# Patient Record
Sex: Female | Born: 1935 | Race: White | Hispanic: No | Smoking: Never smoker
Health system: Southern US, Community
[De-identification: ages and names within clinical notes are randomized; demographics above are authoritative.]

## PROBLEM LIST (undated history)

## (undated) DIAGNOSIS — I10 Essential (primary) hypertension: Secondary | ICD-10-CM

## (undated) DIAGNOSIS — M810 Age-related osteoporosis without current pathological fracture: Secondary | ICD-10-CM

## (undated) DIAGNOSIS — E559 Vitamin D deficiency, unspecified: Secondary | ICD-10-CM

## (undated) DIAGNOSIS — E785 Hyperlipidemia, unspecified: Secondary | ICD-10-CM

## (undated) DIAGNOSIS — Z853 Personal history of malignant neoplasm of breast: Secondary | ICD-10-CM

## (undated) DIAGNOSIS — Z8542 Personal history of malignant neoplasm of other parts of uterus: Secondary | ICD-10-CM

## (undated) DIAGNOSIS — R7303 Prediabetes: Secondary | ICD-10-CM

## (undated) HISTORY — DX: Personal history of malignant neoplasm of breast: Z85.3

## (undated) HISTORY — PX: TOTAL ABDOMINAL HYSTERECTOMY W/ BILATERAL SALPINGOOPHORECTOMY: SHX83

## (undated) HISTORY — PX: TONSILLECTOMY: SUR1361

## (undated) HISTORY — DX: Vitamin D deficiency, unspecified: E55.9

## (undated) HISTORY — DX: Hyperlipidemia, unspecified: E78.5

## (undated) HISTORY — DX: Personal history of malignant neoplasm of other parts of uterus: Z85.42

## (undated) HISTORY — DX: Prediabetes: R73.03

## (undated) HISTORY — DX: Age-related osteoporosis without current pathological fracture: M81.0

## (undated) HISTORY — DX: Essential (primary) hypertension: I10

---

## 1977-12-05 HISTORY — PX: COSMETIC SURGERY: SHX468

## 1980-12-05 DIAGNOSIS — Z8542 Personal history of malignant neoplasm of other parts of uterus: Secondary | ICD-10-CM

## 1980-12-05 HISTORY — DX: Personal history of malignant neoplasm of other parts of uterus: Z85.42

## 1998-06-11 ENCOUNTER — Other Ambulatory Visit: Admission: RE | Admit: 1998-06-11 | Discharge: 1998-06-11 | Payer: Self-pay | Admitting: Family Medicine

## 2001-07-10 ENCOUNTER — Other Ambulatory Visit: Admission: RE | Admit: 2001-07-10 | Discharge: 2001-07-10 | Payer: Self-pay | Admitting: Family Medicine

## 2004-09-27 ENCOUNTER — Ambulatory Visit: Payer: Self-pay | Admitting: Family Medicine

## 2004-11-23 ENCOUNTER — Ambulatory Visit: Payer: Self-pay | Admitting: Family Medicine

## 2004-12-03 ENCOUNTER — Ambulatory Visit: Payer: Self-pay | Admitting: *Deleted

## 2005-04-26 ENCOUNTER — Ambulatory Visit: Payer: Self-pay | Admitting: Family Medicine

## 2005-12-05 HISTORY — PX: BREAST SURGERY: SHX581

## 2006-01-15 ENCOUNTER — Encounter: Admission: RE | Admit: 2006-01-15 | Discharge: 2006-01-15 | Payer: Self-pay | Admitting: Obstetrics and Gynecology

## 2006-01-25 ENCOUNTER — Ambulatory Visit: Payer: Self-pay | Admitting: Oncology

## 2006-02-03 ENCOUNTER — Ambulatory Visit (HOSPITAL_COMMUNITY): Admission: RE | Admit: 2006-02-03 | Discharge: 2006-02-03 | Payer: Self-pay | Admitting: Hematology and Oncology

## 2006-03-07 LAB — CBC WITH DIFFERENTIAL/PLATELET
Basophils Absolute: 0 10*3/uL (ref 0.0–0.1)
Eosinophils Absolute: 0 10*3/uL (ref 0.0–0.5)
HCT: 33.8 % — ABNORMAL LOW (ref 34.8–46.6)
HGB: 11.7 g/dL (ref 11.6–15.9)
LYMPH%: 31.4 % (ref 14.0–48.0)
MCV: 88.7 fL (ref 81.0–101.0)
MONO#: 0.3 10*3/uL (ref 0.1–0.9)
MONO%: 21.4 % — ABNORMAL HIGH (ref 0.0–13.0)
NEUT#: 0.6 10*3/uL — ABNORMAL LOW (ref 1.5–6.5)
Platelets: 248 10*3/uL (ref 145–400)

## 2006-03-14 ENCOUNTER — Ambulatory Visit: Payer: Self-pay | Admitting: Oncology

## 2006-03-14 LAB — COMPREHENSIVE METABOLIC PANEL WITH GFR
ALT: 17 U/L (ref 0–40)
AST: 25 U/L (ref 0–37)
Albumin: 3.2 g/dL — ABNORMAL LOW (ref 3.5–5.2)
Alkaline Phosphatase: 128 U/L — ABNORMAL HIGH (ref 39–117)
BUN: 10 mg/dL (ref 6–23)
CO2: 33 meq/L — ABNORMAL HIGH (ref 19–32)
Calcium: 9.5 mg/dL (ref 8.4–10.5)
Chloride: 99 meq/L (ref 96–112)
Creatinine, Ser: 1.1 mg/dL (ref 0.4–1.2)
Glucose, Bld: 118 mg/dL — ABNORMAL HIGH (ref 70–99)
Potassium: 2.9 meq/L — ABNORMAL LOW (ref 3.5–5.3)
Sodium: 138 meq/L (ref 135–145)
Total Bilirubin: 0.5 mg/dL (ref 0.3–1.2)
Total Protein: 6.2 g/dL (ref 6.0–8.3)

## 2006-03-14 LAB — CBC WITH DIFFERENTIAL/PLATELET
Basophils Absolute: 0 10*3/uL (ref 0.0–0.1)
Eosinophils Absolute: 0 10*3/uL (ref 0.0–0.5)
HGB: 9.9 g/dL — ABNORMAL LOW (ref 11.6–15.9)
MCV: 90.2 fL (ref 81.0–101.0)
MONO#: 1.5 10*3/uL — ABNORMAL HIGH (ref 0.1–0.9)
MONO%: 3.6 % (ref 0.0–13.0)
NEUT#: 37.5 10*3/uL — ABNORMAL HIGH (ref 1.5–6.5)
Platelets: 193 10*3/uL (ref 145–400)
RBC: 3.31 10*6/uL — ABNORMAL LOW (ref 3.70–5.32)
RDW: 14.9 % — ABNORMAL HIGH (ref 11.3–14.5)
WBC: 40.8 10*3/uL — ABNORMAL HIGH (ref 3.9–10.0)

## 2006-03-28 LAB — CBC WITH DIFFERENTIAL/PLATELET
BASO%: 0.9 % (ref 0.0–2.0)
EOS%: 1.5 % (ref 0.0–7.0)
LYMPH%: 12.4 % — ABNORMAL LOW (ref 14.0–48.0)
MCH: 30.3 pg (ref 26.0–34.0)
MCHC: 33 g/dL (ref 32.0–36.0)
MCV: 91.8 fL (ref 81.0–101.0)
MONO%: 8.8 % (ref 0.0–13.0)
NEUT#: 6 10*3/uL (ref 1.5–6.5)
RBC: 3.82 10*6/uL (ref 3.70–5.32)
RDW: 17.4 % — ABNORMAL HIGH (ref 11.3–14.5)

## 2006-03-28 LAB — COMPREHENSIVE METABOLIC PANEL
ALT: 14 U/L (ref 0–40)
AST: 23 U/L (ref 0–37)
Albumin: 4.1 g/dL (ref 3.5–5.2)
Alkaline Phosphatase: 103 U/L (ref 39–117)
Glucose, Bld: 104 mg/dL — ABNORMAL HIGH (ref 70–99)
Potassium: 3.4 mEq/L — ABNORMAL LOW (ref 3.5–5.3)
Sodium: 135 mEq/L (ref 135–145)
Total Protein: 7.4 g/dL (ref 6.0–8.3)

## 2006-04-04 LAB — CBC WITH DIFFERENTIAL/PLATELET
BASO%: 0.6 % (ref 0.0–2.0)
HCT: 32.7 % — ABNORMAL LOW (ref 34.8–46.6)
HGB: 11.3 g/dL — ABNORMAL LOW (ref 11.6–15.9)
MCHC: 34.7 g/dL (ref 32.0–36.0)
MONO#: 0.1 10*3/uL (ref 0.1–0.9)
NEUT%: 86 % — ABNORMAL HIGH (ref 39.6–76.8)
WBC: 5.3 10*3/uL (ref 3.9–10.0)
lymph#: 0.5 10*3/uL — ABNORMAL LOW (ref 0.9–3.3)

## 2006-04-04 LAB — COMPREHENSIVE METABOLIC PANEL
ALT: 10 U/L (ref 0–40)
BUN: 27 mg/dL — ABNORMAL HIGH (ref 6–23)
CO2: 30 mEq/L (ref 19–32)
Calcium: 9.5 mg/dL (ref 8.4–10.5)
Chloride: 96 mEq/L (ref 96–112)
Creatinine, Ser: 1.2 mg/dL (ref 0.4–1.2)
Glucose, Bld: 97 mg/dL (ref 70–99)

## 2006-04-11 LAB — CBC WITH DIFFERENTIAL/PLATELET
Basophils Absolute: 0 10*3/uL (ref 0.0–0.1)
EOS%: 7.7 % — ABNORMAL HIGH (ref 0.0–7.0)
Eosinophils Absolute: 0.1 10*3/uL (ref 0.0–0.5)
HCT: 27.7 % — ABNORMAL LOW (ref 34.8–46.6)
HGB: 9.5 g/dL — ABNORMAL LOW (ref 11.6–15.9)
MCH: 31.1 pg (ref 26.0–34.0)
MONO#: 0 10*3/uL — ABNORMAL LOW (ref 0.1–0.9)
NEUT#: 1.3 10*3/uL — ABNORMAL LOW (ref 1.5–6.5)
NEUT%: 70.2 % (ref 39.6–76.8)
RDW: 17.3 % — ABNORMAL HIGH (ref 11.3–14.5)
WBC: 1.8 10*3/uL — ABNORMAL LOW (ref 3.9–10.0)
lymph#: 0.4 10*3/uL — ABNORMAL LOW (ref 0.9–3.3)

## 2006-04-18 LAB — CBC WITH DIFFERENTIAL/PLATELET
Basophils Absolute: 0.3 10*3/uL — ABNORMAL HIGH (ref 0.0–0.1)
EOS%: 0.8 % (ref 0.0–7.0)
Eosinophils Absolute: 0.2 10*3/uL (ref 0.0–0.5)
HCT: 31.3 % — ABNORMAL LOW (ref 34.8–46.6)
HGB: 10.9 g/dL — ABNORMAL LOW (ref 11.6–15.9)
MCH: 33.5 pg (ref 26.0–34.0)
MCV: 96.3 fL (ref 81.0–101.0)
MONO%: 8.2 % (ref 0.0–13.0)
NEUT#: 22.7 10*3/uL — ABNORMAL HIGH (ref 1.5–6.5)
NEUT%: 84.9 % — ABNORMAL HIGH (ref 39.6–76.8)
Platelets: 199 10*3/uL (ref 145–400)
RDW: 21.2 % — ABNORMAL HIGH (ref 11.3–14.5)

## 2006-04-25 LAB — CBC WITH DIFFERENTIAL/PLATELET
Basophils Absolute: 0.2 10*3/uL — ABNORMAL HIGH (ref 0.0–0.1)
EOS%: 0.3 % (ref 0.0–7.0)
Eosinophils Absolute: 0.1 10*3/uL (ref 0.0–0.5)
LYMPH%: 5 % — ABNORMAL LOW (ref 14.0–48.0)
MCH: 32.2 pg (ref 26.0–34.0)
MCV: 98.2 fL (ref 81.0–101.0)
MONO%: 3.8 % (ref 0.0–13.0)
NEUT#: 19.8 10*3/uL — ABNORMAL HIGH (ref 1.5–6.5)
Platelets: 160 10*3/uL (ref 145–400)
RBC: 3.4 10*6/uL — ABNORMAL LOW (ref 3.70–5.32)

## 2006-04-25 LAB — COMPREHENSIVE METABOLIC PANEL
AST: 23 U/L (ref 0–37)
Alkaline Phosphatase: 133 U/L — ABNORMAL HIGH (ref 39–117)
BUN: 14 mg/dL (ref 6–23)
Glucose, Bld: 81 mg/dL (ref 70–99)
Sodium: 135 mEq/L (ref 135–145)
Total Bilirubin: 0.4 mg/dL (ref 0.3–1.2)

## 2006-05-02 ENCOUNTER — Ambulatory Visit: Payer: Self-pay | Admitting: Oncology

## 2006-05-02 LAB — URINALYSIS, MICROSCOPIC - CHCC
Bilirubin (Urine): NEGATIVE
Nitrite: NEGATIVE
Protein: <30 mg/dL
pH: 6 (ref 4.6–8.0)

## 2006-05-02 LAB — CBC WITH DIFFERENTIAL/PLATELET
Basophils Absolute: 0 10*3/uL (ref 0.0–0.1)
Eosinophils Absolute: 0.1 10*3/uL (ref 0.0–0.5)
HGB: 11 g/dL — ABNORMAL LOW (ref 11.6–15.9)
LYMPH%: 9.3 % — ABNORMAL LOW (ref 14.0–48.0)
MCV: 98 fL (ref 81.0–101.0)
MONO%: 3 % (ref 0.0–13.0)
NEUT#: 4.4 10*3/uL (ref 1.5–6.5)
NEUT%: 85.9 % — ABNORMAL HIGH (ref 39.6–76.8)
Platelets: 165 10*3/uL (ref 145–400)

## 2006-05-02 LAB — COMPREHENSIVE METABOLIC PANEL
Albumin: 4 g/dL (ref 3.5–5.2)
Alkaline Phosphatase: 88 U/L (ref 39–117)
BUN: 23 mg/dL (ref 6–23)
Creatinine, Ser: 1.1 mg/dL (ref 0.4–1.2)
Glucose, Bld: 107 mg/dL — ABNORMAL HIGH (ref 70–99)
Potassium: 3.3 mEq/L — ABNORMAL LOW (ref 3.5–5.3)
Total Bilirubin: 0.4 mg/dL (ref 0.3–1.2)

## 2006-05-09 LAB — CBC WITH DIFFERENTIAL/PLATELET
BASO%: 1.5 % (ref 0.0–2.0)
EOS%: 29.1 % — ABNORMAL HIGH (ref 0.0–7.0)
HGB: 10 g/dL — ABNORMAL LOW (ref 11.6–15.9)
MCH: 35 pg — ABNORMAL HIGH (ref 26.0–34.0)
MCHC: 35.2 g/dL (ref 32.0–36.0)
MCV: 99.5 fL (ref 81.0–101.0)
MONO%: 1.5 % (ref 0.0–13.0)
RBC: 2.86 10*6/uL — ABNORMAL LOW (ref 3.70–5.32)
RDW: 18.3 % — ABNORMAL HIGH (ref 11.3–14.5)
lymph#: 0.2 10*3/uL — ABNORMAL LOW (ref 0.9–3.3)

## 2006-05-09 LAB — URINALYSIS, MICROSCOPIC - CHCC
Bilirubin (Urine): NEGATIVE
Glucose: NEGATIVE g/dL
Ketones: NEGATIVE mg/dL
Leukocyte Esterase: NEGATIVE
RBC count: NEGATIVE (ref 0–2)
pH: 7 (ref 4.6–8.0)

## 2006-05-16 LAB — CBC WITH DIFFERENTIAL/PLATELET
BASO%: 0.9 % (ref 0.0–2.0)
LYMPH%: 2.8 % — ABNORMAL LOW (ref 14.0–48.0)
MCHC: 32.2 g/dL (ref 32.0–36.0)
MONO#: 3 10*3/uL — ABNORMAL HIGH (ref 0.1–0.9)
NEUT#: 30.5 10*3/uL — ABNORMAL HIGH (ref 1.5–6.5)
Platelets: 171 10*3/uL (ref 145–400)
RBC: 3.04 10*6/uL — ABNORMAL LOW (ref 3.70–5.32)
RDW: 21.1 % — ABNORMAL HIGH (ref 11.3–14.5)
WBC: 34.9 10*3/uL — ABNORMAL HIGH (ref 3.9–10.0)

## 2006-05-23 ENCOUNTER — Encounter: Payer: Self-pay | Admitting: Vascular Surgery

## 2006-05-23 ENCOUNTER — Ambulatory Visit: Admission: RE | Admit: 2006-05-23 | Discharge: 2006-05-23 | Payer: Self-pay | Admitting: Oncology

## 2006-05-23 LAB — CBC WITH DIFFERENTIAL/PLATELET
BASO%: 0.9 % (ref 0.0–2.0)
Eosinophils Absolute: 0 10*3/uL (ref 0.0–0.5)
HCT: 32.9 % — ABNORMAL LOW (ref 34.8–46.6)
MCHC: 33.2 g/dL (ref 32.0–36.0)
MONO#: 1.3 10*3/uL — ABNORMAL HIGH (ref 0.1–0.9)
NEUT#: 18.9 10*3/uL — ABNORMAL HIGH (ref 1.5–6.5)
NEUT%: 88.5 % — ABNORMAL HIGH (ref 39.6–76.8)
RBC: 3.24 10*6/uL — ABNORMAL LOW (ref 3.70–5.32)
WBC: 21.4 10*3/uL — ABNORMAL HIGH (ref 3.9–10.0)
lymph#: 0.9 10*3/uL (ref 0.9–3.3)

## 2006-05-30 LAB — CBC WITH DIFFERENTIAL/PLATELET
Basophils Absolute: 0.1 10*3/uL (ref 0.0–0.1)
HCT: 27.8 % — ABNORMAL LOW (ref 34.8–46.6)
HGB: 9.7 g/dL — ABNORMAL LOW (ref 11.6–15.9)
MONO#: 0.1 10*3/uL (ref 0.1–0.9)
NEUT%: 84.3 % — ABNORMAL HIGH (ref 39.6–76.8)
WBC: 4.4 10*3/uL (ref 3.9–10.0)
lymph#: 0.4 10*3/uL — ABNORMAL LOW (ref 0.9–3.3)

## 2006-06-06 LAB — CBC WITH DIFFERENTIAL/PLATELET
BASO%: 0.1 % (ref 0.0–2.0)
EOS%: 24.5 % — ABNORMAL HIGH (ref 0.0–7.0)
HCT: 26.2 % — ABNORMAL LOW (ref 34.8–46.6)
LYMPH%: 16.3 % (ref 14.0–48.0)
MCH: 34.1 pg — ABNORMAL HIGH (ref 26.0–34.0)
MCHC: 34.2 g/dL (ref 32.0–36.0)
NEUT%: 58.2 % (ref 39.6–76.8)
lymph#: 0.2 10*3/uL — ABNORMAL LOW (ref 0.9–3.3)

## 2006-06-06 LAB — COMPREHENSIVE METABOLIC PANEL
ALT: 22 U/L (ref 0–40)
AST: 28 U/L (ref 0–37)
Chloride: 105 mEq/L (ref 96–112)
Creatinine, Ser: 0.98 mg/dL (ref 0.40–1.20)
Total Bilirubin: 0.9 mg/dL (ref 0.3–1.2)

## 2006-06-08 LAB — CBC WITH DIFFERENTIAL/PLATELET
Eosinophils Absolute: 0.1 10*3/uL (ref 0.0–0.5)
HCT: 24.4 % — ABNORMAL LOW (ref 34.8–46.6)
LYMPH%: 25.4 % (ref 14.0–48.0)
MONO#: 0.1 10*3/uL (ref 0.1–0.9)
NEUT#: 0.3 10*3/uL — CL (ref 1.5–6.5)
NEUT%: 44.3 % (ref 39.6–76.8)
Platelets: 43 10*3/uL — ABNORMAL LOW (ref 145–400)
WBC: 0.7 10*3/uL — CL (ref 3.9–10.0)

## 2006-06-08 LAB — COMPREHENSIVE METABOLIC PANEL
BUN: 17 mg/dL (ref 6–23)
CO2: 29 mEq/L (ref 19–32)
Creatinine, Ser: 1.09 mg/dL (ref 0.40–1.20)
Glucose, Bld: 154 mg/dL — ABNORMAL HIGH (ref 70–99)
Total Bilirubin: 0.9 mg/dL (ref 0.3–1.2)

## 2006-06-12 LAB — CBC WITH DIFFERENTIAL/PLATELET
BASO%: 0 % (ref 0.0–2.0)
Basophils Absolute: 0 10*3/uL (ref 0.0–0.1)
HCT: 26.2 % — ABNORMAL LOW (ref 34.8–46.6)
HGB: 8.8 g/dL — ABNORMAL LOW (ref 11.6–15.9)
LYMPH%: 3.7 % — ABNORMAL LOW (ref 14.0–48.0)
MCHC: 33.6 g/dL (ref 32.0–36.0)
MONO#: 1.4 10*3/uL — ABNORMAL HIGH (ref 0.1–0.9)
NEUT%: 90.6 % — ABNORMAL HIGH (ref 39.6–76.8)
Platelets: 131 10*3/uL — ABNORMAL LOW (ref 145–400)
WBC: 27 10*3/uL — ABNORMAL HIGH (ref 3.9–10.0)

## 2006-06-20 ENCOUNTER — Ambulatory Visit: Payer: Self-pay | Admitting: Oncology

## 2006-06-20 LAB — CBC WITH DIFFERENTIAL/PLATELET
BASO%: 0.2 % (ref 0.0–2.0)
Basophils Absolute: 0 10*3/uL (ref 0.0–0.1)
EOS%: 0.1 % (ref 0.0–7.0)
HCT: 32.8 % — ABNORMAL LOW (ref 34.8–46.6)
LYMPH%: 6.4 % — ABNORMAL LOW (ref 14.0–48.0)
MCH: 35.1 pg — ABNORMAL HIGH (ref 26.0–34.0)
MCHC: 33.1 g/dL (ref 32.0–36.0)
MCV: 106 fL — ABNORMAL HIGH (ref 81.0–101.0)
MONO%: 3.6 % (ref 0.0–13.0)
NEUT%: 89.7 % — ABNORMAL HIGH (ref 39.6–76.8)
Platelets: 163 10*3/uL (ref 145–400)
lymph#: 1.1 10*3/uL (ref 0.9–3.3)

## 2006-06-20 LAB — COMPREHENSIVE METABOLIC PANEL
ALT: 18 U/L (ref 0–40)
AST: 17 U/L (ref 0–37)
Alkaline Phosphatase: 118 U/L — ABNORMAL HIGH (ref 39–117)
BUN: 19 mg/dL (ref 6–23)
Creatinine, Ser: 1.27 mg/dL — ABNORMAL HIGH (ref 0.40–1.20)
Total Bilirubin: 0.3 mg/dL (ref 0.3–1.2)

## 2006-06-27 LAB — CBC WITH DIFFERENTIAL/PLATELET
BASO%: 0.9 % (ref 0.0–2.0)
Basophils Absolute: 0 10*3/uL (ref 0.0–0.1)
EOS%: 1.7 % (ref 0.0–7.0)
HCT: 33.1 % — ABNORMAL LOW (ref 34.8–46.6)
HGB: 10.8 g/dL — ABNORMAL LOW (ref 11.6–15.9)
LYMPH%: 11.5 % — ABNORMAL LOW (ref 14.0–48.0)
MCH: 34.7 pg — ABNORMAL HIGH (ref 26.0–34.0)
MCHC: 32.5 g/dL (ref 32.0–36.0)
NEUT%: 82.2 % — ABNORMAL HIGH (ref 39.6–76.8)
Platelets: 99 10*3/uL — ABNORMAL LOW (ref 145–400)
lymph#: 0.6 10*3/uL — ABNORMAL LOW (ref 0.9–3.3)

## 2006-07-04 LAB — CBC WITH DIFFERENTIAL/PLATELET
BASO%: 0.3 % (ref 0.0–2.0)
Basophils Absolute: 0 10*3/uL (ref 0.0–0.1)
EOS%: 15.3 % — ABNORMAL HIGH (ref 0.0–7.0)
HCT: 28.4 % — ABNORMAL LOW (ref 34.8–46.6)
HGB: 9.4 g/dL — ABNORMAL LOW (ref 11.6–15.9)
MCH: 34.8 pg — ABNORMAL HIGH (ref 26.0–34.0)
MCHC: 33.1 g/dL (ref 32.0–36.0)
MCV: 105 fL — ABNORMAL HIGH (ref 81.0–101.0)
MONO%: 1.9 % (ref 0.0–13.0)
NEUT%: 63.9 % (ref 39.6–76.8)
RDW: 16.5 % — ABNORMAL HIGH (ref 11.3–14.5)
lymph#: 0.3 10*3/uL — ABNORMAL LOW (ref 0.9–3.3)

## 2006-07-11 LAB — COMPREHENSIVE METABOLIC PANEL
AST: 27 U/L (ref 0–37)
Alkaline Phosphatase: 104 U/L (ref 39–117)
Glucose, Bld: 137 mg/dL — ABNORMAL HIGH (ref 70–99)
Sodium: 135 mEq/L (ref 135–145)
Total Bilirubin: 0.8 mg/dL (ref 0.3–1.2)
Total Protein: 6.2 g/dL (ref 6.0–8.3)

## 2006-07-11 LAB — CBC WITH DIFFERENTIAL/PLATELET
EOS%: 1.4 % (ref 0.0–7.0)
Eosinophils Absolute: 0.2 10*3/uL (ref 0.0–0.5)
LYMPH%: 6.9 % — ABNORMAL LOW (ref 14.0–48.0)
MCH: 36 pg — ABNORMAL HIGH (ref 26.0–34.0)
MCHC: 33.2 g/dL (ref 32.0–36.0)
MCV: 108.6 fL — ABNORMAL HIGH (ref 81.0–101.0)
MONO%: 7.5 % (ref 0.0–13.0)
NEUT#: 12.5 10*3/uL — ABNORMAL HIGH (ref 1.5–6.5)
Platelets: 126 10*3/uL — ABNORMAL LOW (ref 145–400)
RBC: 2.54 10*6/uL — ABNORMAL LOW (ref 3.70–5.32)

## 2006-07-12 LAB — CBC WITH DIFFERENTIAL/PLATELET
Eosinophils Absolute: 0.2 10*3/uL (ref 0.0–0.5)
HCT: 28.2 % — ABNORMAL LOW (ref 34.8–46.6)
LYMPH%: 4.5 % — ABNORMAL LOW (ref 14.0–48.0)
MONO#: 1.7 10*3/uL — ABNORMAL HIGH (ref 0.1–0.9)
NEUT#: 21.7 10*3/uL — ABNORMAL HIGH (ref 1.5–6.5)
NEUT%: 86.6 % — ABNORMAL HIGH (ref 39.6–76.8)
Platelets: 119 10*3/uL — ABNORMAL LOW (ref 145–400)
WBC: 25 10*3/uL — ABNORMAL HIGH (ref 3.9–10.0)

## 2006-07-12 LAB — COMPREHENSIVE METABOLIC PANEL
ALT: 18 U/L (ref 0–40)
AST: 29 U/L (ref 0–37)
Albumin: 3.3 g/dL — ABNORMAL LOW (ref 3.5–5.2)
Alkaline Phosphatase: 126 U/L — ABNORMAL HIGH (ref 39–117)
Potassium: 3.3 mEq/L — ABNORMAL LOW (ref 3.5–5.3)
Sodium: 140 mEq/L (ref 135–145)
Total Bilirubin: 0.5 mg/dL (ref 0.3–1.2)
Total Protein: 6.3 g/dL (ref 6.0–8.3)

## 2006-07-18 ENCOUNTER — Ambulatory Visit: Admission: RE | Admit: 2006-07-18 | Discharge: 2006-07-18 | Payer: Self-pay | Admitting: Oncology

## 2006-07-18 ENCOUNTER — Encounter: Payer: Self-pay | Admitting: Vascular Surgery

## 2006-07-18 LAB — CBC WITH DIFFERENTIAL/PLATELET
EOS%: 0.5 % (ref 0.0–7.0)
LYMPH%: 5.7 % — ABNORMAL LOW (ref 14.0–48.0)
MCH: 35.4 pg — ABNORMAL HIGH (ref 26.0–34.0)
MCV: 111 fL — ABNORMAL HIGH (ref 81.0–101.0)
MONO%: 6.1 % (ref 0.0–13.0)
Platelets: 113 10*3/uL — ABNORMAL LOW (ref 145–400)
RBC: 3 10*6/uL — ABNORMAL LOW (ref 3.70–5.32)
RDW: 17.6 % — ABNORMAL HIGH (ref 11.3–14.5)

## 2006-07-21 LAB — CBC WITH DIFFERENTIAL/PLATELET
BASO%: 0.8 % (ref 0.0–2.0)
Eosinophils Absolute: 0.1 10*3/uL (ref 0.0–0.5)
LYMPH%: 8.7 % — ABNORMAL LOW (ref 14.0–48.0)
MCHC: 31.7 g/dL — ABNORMAL LOW (ref 32.0–36.0)
MCV: 110 fL — ABNORMAL HIGH (ref 81.0–101.0)
MONO#: 0.9 10*3/uL (ref 0.1–0.9)
MONO%: 9.4 % (ref 0.0–13.0)
NEUT#: 7.3 10*3/uL — ABNORMAL HIGH (ref 1.5–6.5)
Platelets: 142 10*3/uL — ABNORMAL LOW (ref 145–400)
RBC: 3.11 10*6/uL — ABNORMAL LOW (ref 3.70–5.32)
RDW: 16.7 % — ABNORMAL HIGH (ref 11.3–14.5)
WBC: 9.1 10*3/uL (ref 3.9–10.0)

## 2006-07-21 LAB — PROTIME-INR
INR: 1 — ABNORMAL LOW (ref 2.00–3.50)
Protime: 12 Seconds (ref 10.6–13.4)

## 2006-07-24 LAB — COMPREHENSIVE METABOLIC PANEL
AST: 20 U/L (ref 0–37)
Alkaline Phosphatase: 74 U/L (ref 39–117)
BUN: 27 mg/dL — ABNORMAL HIGH (ref 6–23)
Glucose, Bld: 118 mg/dL — ABNORMAL HIGH (ref 70–99)
Potassium: 3.4 mEq/L — ABNORMAL LOW (ref 3.5–5.3)
Total Bilirubin: 0.3 mg/dL (ref 0.3–1.2)

## 2006-07-24 LAB — PROTHROMBIN TIME: INR: 2.7 — ABNORMAL HIGH (ref 0.0–1.5)

## 2006-07-25 LAB — CBC WITH DIFFERENTIAL/PLATELET
BASO%: 1.2 % (ref 0.0–2.0)
Basophils Absolute: 0.1 10*3/uL (ref 0.0–0.1)
EOS%: 1 % (ref 0.0–7.0)
HCT: 34.1 % — ABNORMAL LOW (ref 34.8–46.6)
HGB: 11.1 g/dL — ABNORMAL LOW (ref 11.6–15.9)
LYMPH%: 10.6 % — ABNORMAL LOW (ref 14.0–48.0)
MCH: 35.7 pg — ABNORMAL HIGH (ref 26.0–34.0)
MCHC: 32.6 g/dL (ref 32.0–36.0)
MCV: 110 fL — ABNORMAL HIGH (ref 81.0–101.0)
MONO%: 11.1 % (ref 0.0–13.0)
NEUT%: 76 % (ref 39.6–76.8)
Platelets: 155 10*3/uL (ref 145–400)

## 2006-08-01 LAB — CBC WITH DIFFERENTIAL/PLATELET
BASO%: 0.9 % (ref 0.0–2.0)
Basophils Absolute: 0 10*3/uL (ref 0.0–0.1)
EOS%: 5.7 % (ref 0.0–7.0)
HGB: 11.2 g/dL — ABNORMAL LOW (ref 11.6–15.9)
MCH: 34.3 pg — ABNORMAL HIGH (ref 26.0–34.0)
MCHC: 32.1 g/dL (ref 32.0–36.0)
MCV: 107 fL — ABNORMAL HIGH (ref 81.0–101.0)
MONO%: 5.6 % (ref 0.0–13.0)
RDW: 14.7 % — ABNORMAL HIGH (ref 11.3–14.5)
lymph#: 0.7 10*3/uL — ABNORMAL LOW (ref 0.9–3.3)

## 2006-08-08 ENCOUNTER — Ambulatory Visit: Payer: Self-pay | Admitting: Oncology

## 2006-08-08 LAB — CBC WITH DIFFERENTIAL/PLATELET
EOS%: 10 % — ABNORMAL HIGH (ref 0.0–7.0)
Eosinophils Absolute: 0.2 10*3/uL (ref 0.0–0.5)
HGB: 10 g/dL — ABNORMAL LOW (ref 11.6–15.9)
MCH: 35.6 pg — ABNORMAL HIGH (ref 26.0–34.0)
MCV: 103.9 fL — ABNORMAL HIGH (ref 81.0–101.0)
MONO%: 2.7 % (ref 0.0–13.0)
NEUT#: 1.6 10*3/uL (ref 1.5–6.5)
RBC: 2.8 10*6/uL — ABNORMAL LOW (ref 3.70–5.32)
RDW: 15.8 % — ABNORMAL HIGH (ref 11.3–14.5)
lymph#: 0.4 10*3/uL — ABNORMAL LOW (ref 0.9–3.3)

## 2006-08-08 LAB — COMPREHENSIVE METABOLIC PANEL
ALT: 9 U/L (ref 0–40)
AST: 15 U/L (ref 0–37)
Albumin: 4 g/dL (ref 3.5–5.2)
Alkaline Phosphatase: 54 U/L (ref 39–117)
Calcium: 9.2 mg/dL (ref 8.4–10.5)
Chloride: 101 mEq/L (ref 96–112)
Potassium: 3.6 mEq/L (ref 3.5–5.3)
Sodium: 140 mEq/L (ref 135–145)
Total Protein: 6.8 g/dL (ref 6.0–8.3)

## 2006-08-10 ENCOUNTER — Ambulatory Visit: Admission: RE | Admit: 2006-08-10 | Discharge: 2006-08-27 | Payer: Self-pay | Admitting: *Deleted

## 2006-08-15 LAB — CBC WITH DIFFERENTIAL/PLATELET
Basophils Absolute: 0 10*3/uL (ref 0.0–0.1)
Eosinophils Absolute: 0.1 10*3/uL (ref 0.0–0.5)
HGB: 10.8 g/dL — ABNORMAL LOW (ref 11.6–15.9)
MCV: 103.3 fL — ABNORMAL HIGH (ref 81.0–101.0)
MONO#: 0.2 10*3/uL (ref 0.1–0.9)
NEUT#: 0.5 10*3/uL — ABNORMAL LOW (ref 1.5–6.5)
RDW: 15.8 % — ABNORMAL HIGH (ref 11.3–14.5)
lymph#: 0.5 10*3/uL — ABNORMAL LOW (ref 0.9–3.3)

## 2006-08-15 LAB — COMPREHENSIVE METABOLIC PANEL
Albumin: 3.9 g/dL (ref 3.5–5.2)
BUN: 26 mg/dL — ABNORMAL HIGH (ref 6–23)
Calcium: 9.9 mg/dL (ref 8.4–10.5)
Chloride: 101 mEq/L (ref 96–112)
Glucose, Bld: 111 mg/dL — ABNORMAL HIGH (ref 70–99)
Potassium: 3.4 mEq/L — ABNORMAL LOW (ref 3.5–5.3)

## 2006-08-15 LAB — PROTHROMBIN TIME
INR: 9.8 (ref 0.0–1.5)
Prothrombin Time: 87.3 seconds — ABNORMAL HIGH (ref 11.6–15.2)

## 2006-08-17 LAB — CBC WITH DIFFERENTIAL/PLATELET
BASO%: 0.6 % (ref 0.0–2.0)
Basophils Absolute: 0 10*3/uL (ref 0.0–0.1)
HCT: 27.5 % — ABNORMAL LOW (ref 34.8–46.6)
HGB: 9.2 g/dL — ABNORMAL LOW (ref 11.6–15.9)
LYMPH%: 34.9 % (ref 14.0–48.0)
MCH: 34.8 pg — ABNORMAL HIGH (ref 26.0–34.0)
MCHC: 33.5 g/dL (ref 32.0–36.0)
MONO#: 0.2 10*3/uL (ref 0.1–0.9)
NEUT%: 38.1 % — ABNORMAL LOW (ref 39.6–76.8)
Platelets: 131 10*3/uL — ABNORMAL LOW (ref 145–400)
WBC: 1.1 10*3/uL — ABNORMAL LOW (ref 3.9–10.0)

## 2006-08-17 LAB — COMPREHENSIVE METABOLIC PANEL
ALT: 15 U/L (ref 0–40)
Albumin: 3.2 g/dL — ABNORMAL LOW (ref 3.5–5.2)
CO2: 29 mEq/L (ref 19–32)
Chloride: 109 mEq/L (ref 96–112)
Glucose, Bld: 143 mg/dL — ABNORMAL HIGH (ref 70–99)
Potassium: 3.5 mEq/L (ref 3.5–5.3)
Sodium: 142 mEq/L (ref 135–145)
Total Bilirubin: 0.6 mg/dL (ref 0.3–1.2)
Total Protein: 5.7 g/dL — ABNORMAL LOW (ref 6.0–8.3)

## 2006-08-17 LAB — PROTHROMBIN TIME
INR: 2.9 — ABNORMAL HIGH (ref 0.0–1.5)
Prothrombin Time: 32.1 seconds — ABNORMAL HIGH (ref 11.6–15.2)

## 2006-08-22 LAB — CBC WITH DIFFERENTIAL/PLATELET
BASO%: 0.8 % (ref 0.0–2.0)
Basophils Absolute: 0 10*3/uL (ref 0.0–0.1)
EOS%: 1.9 % (ref 0.0–7.0)
HGB: 10 g/dL — ABNORMAL LOW (ref 11.6–15.9)
MCH: 35.1 pg — ABNORMAL HIGH (ref 26.0–34.0)
MCHC: 33.9 g/dL (ref 32.0–36.0)
MCV: 103.6 fL — ABNORMAL HIGH (ref 81.0–101.0)
MONO%: 25.1 % — ABNORMAL HIGH (ref 0.0–13.0)
NEUT%: 46.1 % (ref 39.6–76.8)
RDW: 15.5 % — ABNORMAL HIGH (ref 11.3–14.5)

## 2006-08-22 LAB — COMPREHENSIVE METABOLIC PANEL
ALT: 14 U/L (ref 0–40)
AST: 28 U/L (ref 0–37)
Alkaline Phosphatase: 55 U/L (ref 39–117)
BUN: 12 mg/dL (ref 6–23)
Creatinine, Ser: 1.13 mg/dL (ref 0.40–1.20)
Potassium: 3.2 mEq/L — ABNORMAL LOW (ref 3.5–5.3)

## 2006-08-28 LAB — COMPREHENSIVE METABOLIC PANEL
Albumin: 3.5 g/dL (ref 3.5–5.2)
Alkaline Phosphatase: 188 U/L — ABNORMAL HIGH (ref 39–117)
CO2: 32 mEq/L (ref 19–32)
Calcium: 9.5 mg/dL (ref 8.4–10.5)
Chloride: 99 mEq/L (ref 96–112)
Glucose, Bld: 126 mg/dL — ABNORMAL HIGH (ref 70–99)
Potassium: 3.1 mEq/L — ABNORMAL LOW (ref 3.5–5.3)
Sodium: 140 mEq/L (ref 135–145)
Total Protein: 6.6 g/dL (ref 6.0–8.3)

## 2006-08-28 LAB — CBC WITH DIFFERENTIAL/PLATELET
Basophils Absolute: 0.3 10*3/uL — ABNORMAL HIGH (ref 0.0–0.1)
Eosinophils Absolute: 0.1 10*3/uL (ref 0.0–0.5)
HCT: 34.1 % — ABNORMAL LOW (ref 34.8–46.6)
LYMPH%: 3.5 % — ABNORMAL LOW (ref 14.0–48.0)
MCV: 103 fL — ABNORMAL HIGH (ref 81.0–101.0)
MONO#: 1.8 10*3/uL — ABNORMAL HIGH (ref 0.1–0.9)
NEUT#: 25.7 10*3/uL — ABNORMAL HIGH (ref 1.5–6.5)
NEUT%: 88.9 % — ABNORMAL HIGH (ref 39.6–76.8)
Platelets: 140 10*3/uL — ABNORMAL LOW (ref 145–400)
WBC: 28.9 10*3/uL — ABNORMAL HIGH (ref 3.9–10.0)

## 2006-08-29 LAB — PROTHROMBIN TIME

## 2006-09-04 ENCOUNTER — Ambulatory Visit: Admission: RE | Admit: 2006-09-04 | Discharge: 2006-11-05 | Payer: Self-pay | Admitting: *Deleted

## 2006-09-05 LAB — CBC WITH DIFFERENTIAL/PLATELET
Eosinophils Absolute: 0.1 10*3/uL (ref 0.0–0.5)
HCT: 33.8 % — ABNORMAL LOW (ref 34.8–46.6)
LYMPH%: 6.2 % — ABNORMAL LOW (ref 14.0–48.0)
MONO#: 0.5 10*3/uL (ref 0.1–0.9)
NEUT#: 11 10*3/uL — ABNORMAL HIGH (ref 1.5–6.5)
NEUT%: 88.3 % — ABNORMAL HIGH (ref 39.6–76.8)
Platelets: 181 10*3/uL (ref 145–400)
WBC: 12.5 10*3/uL — ABNORMAL HIGH (ref 3.9–10.0)

## 2006-09-05 LAB — COMPREHENSIVE METABOLIC PANEL
Albumin: 3.5 g/dL (ref 3.5–5.2)
CO2: 30 mEq/L (ref 19–32)
Glucose, Bld: 104 mg/dL — ABNORMAL HIGH (ref 70–99)
Potassium: 4.4 mEq/L (ref 3.5–5.3)
Sodium: 139 mEq/L (ref 135–145)
Total Protein: 6.9 g/dL (ref 6.0–8.3)

## 2006-09-08 ENCOUNTER — Ambulatory Visit: Admission: RE | Admit: 2006-09-08 | Discharge: 2006-09-08 | Payer: Self-pay | Admitting: Oncology

## 2006-09-08 ENCOUNTER — Encounter: Payer: Self-pay | Admitting: Vascular Surgery

## 2006-09-26 ENCOUNTER — Ambulatory Visit: Payer: Self-pay | Admitting: Oncology

## 2006-09-26 LAB — CBC WITH DIFFERENTIAL/PLATELET
BASO%: 1.2 % (ref 0.0–2.0)
Eosinophils Absolute: 0.2 10*3/uL (ref 0.0–0.5)
MCV: 98.4 fL (ref 81.0–101.0)
MONO#: 0.4 10*3/uL (ref 0.1–0.9)
MONO%: 10.5 % (ref 0.0–13.0)
NEUT#: 2.5 10*3/uL (ref 1.5–6.5)
RBC: 3.69 10*6/uL — ABNORMAL LOW (ref 3.70–5.32)
RDW: 12.7 % (ref 11.3–14.5)
WBC: 3.7 10*3/uL — ABNORMAL LOW (ref 3.9–10.0)

## 2006-09-26 LAB — PROTIME-INR
INR: 1 — ABNORMAL LOW (ref 2.00–3.50)
Protime: 12 Seconds (ref 10.6–13.4)

## 2006-10-03 LAB — CBC WITH DIFFERENTIAL/PLATELET
BASO%: 1.1 % (ref 0.0–2.0)
EOS%: 3.7 % (ref 0.0–7.0)
Eosinophils Absolute: 0.2 10*3/uL (ref 0.0–0.5)
MCH: 32.8 pg (ref 26.0–34.0)
MCHC: 33.8 g/dL (ref 32.0–36.0)
MCV: 96.8 fL (ref 81.0–101.0)
MONO%: 10 % (ref 0.0–13.0)
NEUT#: 4.2 10*3/uL (ref 1.5–6.5)
RBC: 3.57 10*6/uL — ABNORMAL LOW (ref 3.70–5.32)
RDW: 12.1 % (ref 11.3–14.5)

## 2006-10-03 LAB — PROTIME-INR
INR: 1.2 — ABNORMAL LOW (ref 2.00–3.50)
Protime: 14.4 Seconds — ABNORMAL HIGH (ref 10.6–13.4)

## 2006-10-11 LAB — CBC WITH DIFFERENTIAL/PLATELET
BASO%: 1 % (ref 0.0–2.0)
Basophils Absolute: 0 10*3/uL (ref 0.0–0.1)
EOS%: 6 % (ref 0.0–7.0)
HGB: 11.3 g/dL — ABNORMAL LOW (ref 11.6–15.9)
MCH: 32.5 pg (ref 26.0–34.0)
MCHC: 34 g/dL (ref 32.0–36.0)
RDW: 12.1 % (ref 11.3–14.5)
lymph#: 0.5 10*3/uL — ABNORMAL LOW (ref 0.9–3.3)

## 2006-10-11 LAB — PROTIME-INR: INR: 2 (ref 2.00–3.50)

## 2006-10-17 LAB — CBC WITH DIFFERENTIAL/PLATELET
BASO%: 0.8 % (ref 0.0–2.0)
EOS%: 4.2 % (ref 0.0–7.0)
HCT: 32.5 % — ABNORMAL LOW (ref 34.8–46.6)
LYMPH%: 13.9 % — ABNORMAL LOW (ref 14.0–48.0)
MCH: 31.2 pg (ref 26.0–34.0)
MCHC: 33.2 g/dL (ref 32.0–36.0)
MCV: 93.8 fL (ref 81.0–101.0)
NEUT%: 68.3 % (ref 39.6–76.8)
Platelets: 94 10*3/uL — ABNORMAL LOW (ref 145–400)

## 2006-10-24 LAB — CBC WITH DIFFERENTIAL/PLATELET
BASO%: 0.9 % (ref 0.0–2.0)
EOS%: 5 % (ref 0.0–7.0)
MCH: 31.5 pg (ref 26.0–34.0)
MCHC: 32.7 g/dL (ref 32.0–36.0)
MONO%: 14.3 % — ABNORMAL HIGH (ref 0.0–13.0)
NEUT%: 64.2 % (ref 39.6–76.8)
RDW: 12 % (ref 11.3–14.5)
lymph#: 0.5 10*3/uL — ABNORMAL LOW (ref 0.9–3.3)

## 2006-10-24 LAB — PROTIME-INR: INR: 3.9 — ABNORMAL HIGH (ref 2.00–3.50)

## 2006-10-30 LAB — CBC WITH DIFFERENTIAL/PLATELET
Basophils Absolute: 0 10*3/uL (ref 0.0–0.1)
EOS%: 4.1 % (ref 0.0–7.0)
Eosinophils Absolute: 0.1 10*3/uL (ref 0.0–0.5)
HGB: 11.4 g/dL — ABNORMAL LOW (ref 11.6–15.9)
LYMPH%: 14.5 % (ref 14.0–48.0)
MCH: 32.3 pg (ref 26.0–34.0)
MCV: 95.1 fL (ref 81.0–101.0)
MONO%: 11.4 % (ref 0.0–13.0)
NEUT#: 2 10*3/uL (ref 1.5–6.5)
NEUT%: 69.7 % (ref 39.6–76.8)
Platelets: 135 10*3/uL — ABNORMAL LOW (ref 145–400)
RDW: 13.9 % (ref 11.3–14.5)

## 2006-10-30 LAB — COMPREHENSIVE METABOLIC PANEL
AST: 19 U/L (ref 0–37)
Albumin: 4.6 g/dL (ref 3.5–5.2)
BUN: 25 mg/dL — ABNORMAL HIGH (ref 6–23)
CO2: 27 mEq/L (ref 19–32)
Calcium: 9.7 mg/dL (ref 8.4–10.5)
Chloride: 99 mEq/L (ref 96–112)
Creatinine, Ser: 1.24 mg/dL — ABNORMAL HIGH (ref 0.40–1.20)
Glucose, Bld: 82 mg/dL (ref 70–99)
Potassium: 3.9 mEq/L (ref 3.5–5.3)

## 2006-10-30 LAB — CANCER ANTIGEN 27.29: CA 27.29: 14 U/mL (ref 0–39)

## 2006-11-07 LAB — CBC WITH DIFFERENTIAL/PLATELET
BASO%: 1.1 % (ref 0.0–2.0)
Basophils Absolute: 0 10*3/uL (ref 0.0–0.1)
EOS%: 7.5 % — ABNORMAL HIGH (ref 0.0–7.0)
HCT: 33.2 % — ABNORMAL LOW (ref 34.8–46.6)
HGB: 11.3 g/dL — ABNORMAL LOW (ref 11.6–15.9)
LYMPH%: 17 % (ref 14.0–48.0)
MCH: 32.5 pg (ref 26.0–34.0)
MCHC: 34.1 g/dL (ref 32.0–36.0)
MCV: 95.3 fL (ref 81.0–101.0)
MONO%: 11.9 % (ref 0.0–13.0)
NEUT%: 62.4 % (ref 39.6–76.8)
lymph#: 0.6 10*3/uL — ABNORMAL LOW (ref 0.9–3.3)

## 2006-11-07 LAB — PROTIME-INR

## 2006-11-13 ENCOUNTER — Ambulatory Visit: Payer: Self-pay | Admitting: Oncology

## 2006-11-14 LAB — PROTIME-INR

## 2006-12-01 LAB — CBC WITH DIFFERENTIAL/PLATELET
BASO%: 0.9 % (ref 0.0–2.0)
EOS%: 6.2 % (ref 0.0–7.0)
HCT: 33.7 % — ABNORMAL LOW (ref 34.8–46.6)
MCH: 31 pg (ref 26.0–34.0)
MCHC: 34.1 g/dL (ref 32.0–36.0)
NEUT%: 65.6 % (ref 39.6–76.8)
RBC: 3.7 10*6/uL (ref 3.70–5.32)
lymph#: 0.7 10*3/uL — ABNORMAL LOW (ref 0.9–3.3)

## 2006-12-01 LAB — PROTIME-INR
INR: 2.7 (ref 2.00–3.50)
Protime: 32.4 Seconds — ABNORMAL HIGH (ref 10.6–13.4)

## 2006-12-12 LAB — CBC WITH DIFFERENTIAL/PLATELET
BASO%: 1.2 % (ref 0.0–2.0)
Eosinophils Absolute: 0.3 10*3/uL (ref 0.0–0.5)
MCHC: 34.3 g/dL (ref 32.0–36.0)
MONO#: 0.4 10*3/uL (ref 0.1–0.9)
NEUT#: 2.1 10*3/uL (ref 1.5–6.5)
RBC: 3.68 10*6/uL — ABNORMAL LOW (ref 3.70–5.32)
RDW: 12 % (ref 11.3–14.5)
WBC: 3.5 10*3/uL — ABNORMAL LOW (ref 3.9–10.0)

## 2006-12-12 LAB — PROTIME-INR
INR: 2.9 (ref 2.00–3.50)
Protime: 34.8 Seconds — ABNORMAL HIGH (ref 10.6–13.4)

## 2006-12-26 LAB — CBC WITH DIFFERENTIAL/PLATELET
BASO%: 0.9 % (ref 0.0–2.0)
Eosinophils Absolute: 0.2 10*3/uL (ref 0.0–0.5)
LYMPH%: 18.2 % (ref 14.0–48.0)
MCHC: 34.5 g/dL (ref 32.0–36.0)
MCV: 92.1 fL (ref 81.0–101.0)
MONO#: 0.5 10*3/uL (ref 0.1–0.9)
MONO%: 11.2 % (ref 0.0–13.0)
NEUT#: 2.7 10*3/uL (ref 1.5–6.5)
Platelets: 128 10*3/uL — ABNORMAL LOW (ref 145–400)
RBC: 3.7 10*6/uL (ref 3.70–5.32)
RDW: 11.9 % (ref 11.3–14.5)
WBC: 4.2 10*3/uL (ref 3.9–10.0)

## 2006-12-26 LAB — PROTIME-INR
INR: 2.3 (ref 2.00–3.50)
Protime: 27.6 Seconds — ABNORMAL HIGH (ref 10.6–13.4)

## 2007-01-09 ENCOUNTER — Ambulatory Visit: Payer: Self-pay | Admitting: Oncology

## 2007-01-09 LAB — COMPREHENSIVE METABOLIC PANEL
ALT: 10 U/L (ref 0–35)
CO2: 28 mEq/L (ref 19–32)
Calcium: 9.6 mg/dL (ref 8.4–10.5)
Chloride: 97 mEq/L (ref 96–112)
Potassium: 4 mEq/L (ref 3.5–5.3)
Sodium: 137 mEq/L (ref 135–145)
Total Protein: 7.4 g/dL (ref 6.0–8.3)

## 2007-01-09 LAB — CBC WITH DIFFERENTIAL/PLATELET
Basophils Absolute: 0 10*3/uL (ref 0.0–0.1)
HCT: 32.2 % — ABNORMAL LOW (ref 34.8–46.6)
HGB: 11.4 g/dL — ABNORMAL LOW (ref 11.6–15.9)
MONO#: 0.4 10*3/uL (ref 0.1–0.9)
NEUT%: 72.1 % (ref 39.6–76.8)
WBC: 4.1 10*3/uL (ref 3.9–10.0)
lymph#: 0.6 10*3/uL — ABNORMAL LOW (ref 0.9–3.3)

## 2007-01-09 LAB — CANCER ANTIGEN 27.29: CA 27.29: 9 U/mL (ref 0–39)

## 2007-01-09 LAB — PROTIME-INR

## 2007-01-23 LAB — COMPREHENSIVE METABOLIC PANEL WITH GFR
ALT: 10 U/L (ref 0–35)
AST: 16 U/L (ref 0–37)
Albumin: 4.5 g/dL (ref 3.5–5.2)
Alkaline Phosphatase: 75 U/L (ref 39–117)
BUN: 23 mg/dL (ref 6–23)
CO2: 30 meq/L (ref 19–32)
Calcium: 9.9 mg/dL (ref 8.4–10.5)
Chloride: 92 meq/L — ABNORMAL LOW (ref 96–112)
Creatinine, Ser: 1.27 mg/dL — ABNORMAL HIGH (ref 0.40–1.20)
Glucose, Bld: 114 mg/dL — ABNORMAL HIGH (ref 70–99)
Potassium: 4.1 meq/L (ref 3.5–5.3)
Sodium: 132 meq/L — ABNORMAL LOW (ref 135–145)
Total Bilirubin: 0.4 mg/dL (ref 0.3–1.2)
Total Protein: 7.4 g/dL (ref 6.0–8.3)

## 2007-01-23 LAB — CBC WITH DIFFERENTIAL/PLATELET
BASO%: 0.5 % (ref 0.0–2.0)
EOS%: 2.5 % (ref 0.0–7.0)
HCT: 32.1 % — ABNORMAL LOW (ref 34.8–46.6)
MCH: 32.6 pg (ref 26.0–34.0)
MCHC: 35.4 g/dL (ref 32.0–36.0)
NEUT%: 69.2 % (ref 39.6–76.8)
RDW: 13.8 % (ref 11.3–14.5)
lymph#: 0.6 10*3/uL — ABNORMAL LOW (ref 0.9–3.3)

## 2007-01-23 LAB — PROTIME-INR
INR: 2.5 (ref 2.00–3.50)
Protime: 30 s — ABNORMAL HIGH (ref 10.6–13.4)

## 2007-02-06 LAB — CBC WITH DIFFERENTIAL/PLATELET
Eosinophils Absolute: 0.2 10*3/uL (ref 0.0–0.5)
HCT: 32.8 % — ABNORMAL LOW (ref 34.8–46.6)
LYMPH%: 14.1 % (ref 14.0–48.0)
MCHC: 34.7 g/dL (ref 32.0–36.0)
MONO#: 0.4 10*3/uL (ref 0.1–0.9)
NEUT%: 70.6 % (ref 39.6–76.8)
Platelets: 137 10*3/uL — ABNORMAL LOW (ref 145–400)
WBC: 4.2 10*3/uL (ref 3.9–10.0)

## 2007-08-02 ENCOUNTER — Ambulatory Visit: Payer: Self-pay | Admitting: Oncology

## 2007-08-07 LAB — CBC WITH DIFFERENTIAL/PLATELET
Eosinophils Absolute: 0.1 10*3/uL (ref 0.0–0.5)
HCT: 32.8 % — ABNORMAL LOW (ref 34.8–46.6)
HGB: 11.7 g/dL (ref 11.6–15.9)
LYMPH%: 16.5 % (ref 14.0–48.0)
MONO#: 0.5 10*3/uL (ref 0.1–0.9)
NEUT#: 3.8 10*3/uL (ref 1.5–6.5)
NEUT%: 71.8 % (ref 39.6–76.8)
Platelets: 135 10*3/uL — ABNORMAL LOW (ref 145–400)
WBC: 5.3 10*3/uL (ref 3.9–10.0)

## 2007-08-07 LAB — COMPREHENSIVE METABOLIC PANEL
CO2: 24 mEq/L (ref 19–32)
Calcium: 10.2 mg/dL (ref 8.4–10.5)
Creatinine, Ser: 1.36 mg/dL — ABNORMAL HIGH (ref 0.40–1.20)
Glucose, Bld: 92 mg/dL (ref 70–99)
Total Bilirubin: 0.6 mg/dL (ref 0.3–1.2)
Total Protein: 7.9 g/dL (ref 6.0–8.3)

## 2008-01-31 ENCOUNTER — Ambulatory Visit: Payer: Self-pay | Admitting: Oncology

## 2008-02-04 LAB — CBC WITH DIFFERENTIAL/PLATELET
Basophils Absolute: 0 10*3/uL (ref 0.0–0.1)
HCT: 33.8 % — ABNORMAL LOW (ref 34.8–46.6)
HGB: 11.8 g/dL (ref 11.6–15.9)
MONO#: 0.2 10*3/uL (ref 0.1–0.9)
NEUT#: 2.5 10*3/uL (ref 1.5–6.5)
NEUT%: 65.6 % (ref 39.6–76.8)
WBC: 3.8 10*3/uL — ABNORMAL LOW (ref 3.9–10.0)
lymph#: 0.9 10*3/uL (ref 0.9–3.3)

## 2008-02-05 LAB — COMPREHENSIVE METABOLIC PANEL WITH GFR
ALT: 8 U/L (ref 0–35)
AST: 18 U/L (ref 0–37)
Albumin: 4.5 g/dL (ref 3.5–5.2)
Alkaline Phosphatase: 62 U/L (ref 39–117)
BUN: 32 mg/dL — ABNORMAL HIGH (ref 6–23)
CO2: 26 meq/L (ref 19–32)
Calcium: 9.4 mg/dL (ref 8.4–10.5)
Chloride: 100 meq/L (ref 96–112)
Creatinine, Ser: 1.29 mg/dL — ABNORMAL HIGH (ref 0.40–1.20)
Glucose, Bld: 90 mg/dL (ref 70–99)
Potassium: 3.7 meq/L (ref 3.5–5.3)
Sodium: 136 meq/L (ref 135–145)
Total Bilirubin: 0.5 mg/dL (ref 0.3–1.2)
Total Protein: 8 g/dL (ref 6.0–8.3)

## 2008-02-05 LAB — CANCER ANTIGEN 27.29: CA 27.29: 17 U/mL (ref 0–39)

## 2008-02-08 LAB — VITAMIN D 1,25 DIHYDROXY: Vit D, 1,25-Dihydroxy: 41 pg/mL (ref 6–62)

## 2008-07-17 ENCOUNTER — Ambulatory Visit: Payer: Self-pay | Admitting: Vascular Surgery

## 2008-08-01 ENCOUNTER — Ambulatory Visit: Payer: Self-pay | Admitting: Oncology

## 2008-08-06 LAB — CBC WITH DIFFERENTIAL/PLATELET
BASO%: 0.4 % (ref 0.0–2.0)
Basophils Absolute: 0 10*3/uL (ref 0.0–0.1)
EOS%: 3.6 % (ref 0.0–7.0)
Eosinophils Absolute: 0.2 10*3/uL (ref 0.0–0.5)
HCT: 36.2 % (ref 34.8–46.6)
HGB: 12.3 g/dL (ref 11.6–15.9)
LYMPH%: 24.5 % (ref 14.0–48.0)
MCH: 31.3 pg (ref 26.0–34.0)
MCHC: 34.1 g/dL (ref 32.0–36.0)
MCV: 91.9 fL (ref 81.0–101.0)
MONO#: 0.4 10*3/uL (ref 0.1–0.9)
MONO%: 8.7 % (ref 0.0–13.0)
NEUT#: 2.7 10*3/uL (ref 1.5–6.5)
NEUT%: 62.8 % (ref 39.6–76.8)
Platelets: 142 10*3/uL — ABNORMAL LOW (ref 145–400)
RBC: 3.94 10*6/uL (ref 3.70–5.32)
RDW: 14.1 % (ref 11.3–14.5)
WBC: 4.4 10*3/uL (ref 3.9–10.0)
lymph#: 1.1 10*3/uL (ref 0.9–3.3)

## 2008-08-07 LAB — VITAMIN D 25 HYDROXY (VIT D DEFICIENCY, FRACTURES): Vit D, 25-Hydroxy: 39 ng/mL (ref 30–89)

## 2008-08-07 LAB — COMPREHENSIVE METABOLIC PANEL
Alkaline Phosphatase: 61 U/L (ref 39–117)
Glucose, Bld: 97 mg/dL (ref 70–99)
Sodium: 139 mEq/L (ref 135–145)
Total Bilirubin: 0.3 mg/dL (ref 0.3–1.2)
Total Protein: 7.9 g/dL (ref 6.0–8.3)

## 2008-08-15 ENCOUNTER — Ambulatory Visit (HOSPITAL_COMMUNITY): Admission: RE | Admit: 2008-08-15 | Discharge: 2008-08-15 | Payer: Self-pay | Admitting: Oncology

## 2008-09-04 ENCOUNTER — Ambulatory Visit (HOSPITAL_COMMUNITY): Admission: RE | Admit: 2008-09-04 | Discharge: 2008-09-04 | Payer: Self-pay | Admitting: Urology

## 2008-10-03 ENCOUNTER — Ambulatory Visit: Payer: Self-pay | Admitting: Oncology

## 2008-10-07 LAB — CBC WITH DIFFERENTIAL/PLATELET
Basophils Absolute: 0 10*3/uL (ref 0.0–0.1)
Eosinophils Absolute: 0.1 10*3/uL (ref 0.0–0.5)
HCT: 35.3 % (ref 34.8–46.6)
HGB: 12 g/dL (ref 11.6–15.9)
LYMPH%: 20 % (ref 14.0–48.0)
MCV: 92.9 fL (ref 81.0–101.0)
MONO%: 7.3 % (ref 0.0–13.0)
NEUT#: 2.9 10*3/uL (ref 1.5–6.5)
NEUT%: 69.8 % (ref 39.6–76.8)
Platelets: 153 10*3/uL (ref 145–400)

## 2008-10-08 LAB — COMPREHENSIVE METABOLIC PANEL
ALT: 11 U/L (ref 0–35)
AST: 20 U/L (ref 0–37)
Albumin: 4.7 g/dL (ref 3.5–5.2)
Calcium: 10.7 mg/dL — ABNORMAL HIGH (ref 8.4–10.5)
Chloride: 98 mEq/L (ref 96–112)
Potassium: 4.3 mEq/L (ref 3.5–5.3)

## 2008-12-05 HISTORY — PX: CATARACT EXTRACTION: SUR2

## 2009-04-14 ENCOUNTER — Ambulatory Visit: Payer: Self-pay | Admitting: Oncology

## 2009-04-15 LAB — CBC WITH DIFFERENTIAL/PLATELET
BASO%: 0.5 % (ref 0.0–2.0)
EOS%: 4.5 % (ref 0.0–7.0)
HCT: 33.7 % — ABNORMAL LOW (ref 34.8–46.6)
MCH: 31.7 pg (ref 25.1–34.0)
MCHC: 34.1 g/dL (ref 31.5–36.0)
NEUT%: 59.7 % (ref 38.4–76.8)
RDW: 13.5 % (ref 11.2–14.5)
lymph#: 0.9 10*3/uL (ref 0.9–3.3)

## 2009-04-16 LAB — COMPREHENSIVE METABOLIC PANEL
ALT: 10 U/L (ref 0–35)
AST: 19 U/L (ref 0–37)
Calcium: 10.4 mg/dL (ref 8.4–10.5)
Chloride: 98 mEq/L (ref 96–112)
Creatinine, Ser: 1.14 mg/dL (ref 0.40–1.20)

## 2009-10-13 ENCOUNTER — Ambulatory Visit: Payer: Self-pay | Admitting: Oncology

## 2009-10-15 LAB — CBC WITH DIFFERENTIAL/PLATELET
BASO%: 0.3 % (ref 0.0–2.0)
EOS%: 4.7 % (ref 0.0–7.0)
MONO%: 7.6 % (ref 0.0–14.0)
NEUT#: 3 10*3/uL (ref 1.5–6.5)
WBC: 4.7 10*3/uL (ref 3.9–10.3)
lymph#: 1.1 10*3/uL (ref 0.9–3.3)

## 2009-10-16 LAB — COMPREHENSIVE METABOLIC PANEL
ALT: 8 U/L (ref 0–35)
AST: 20 U/L (ref 0–37)
Alkaline Phosphatase: 64 U/L (ref 39–117)
BUN: 29 mg/dL — ABNORMAL HIGH (ref 6–23)
CO2: 28 mEq/L (ref 19–32)
Chloride: 97 mEq/L (ref 96–112)
Creatinine, Ser: 1.35 mg/dL — ABNORMAL HIGH (ref 0.40–1.20)
Potassium: 4 mEq/L (ref 3.5–5.3)
Sodium: 136 mEq/L (ref 135–145)
Total Bilirubin: 0.3 mg/dL (ref 0.3–1.2)

## 2009-10-16 LAB — VITAMIN D 25 HYDROXY (VIT D DEFICIENCY, FRACTURES): Vit D, 25-Hydroxy: 42 ng/mL (ref 30–89)

## 2010-03-16 IMAGING — CT CT CHEST W/O CM
2 of 4 series · 17 of 46 positions shown, 19 images · non-contrast
Comparison: PET CT 02/03/2006

CT CHEST

CLINICAL DATA: Breast cancer.

CT CHEST, ABDOMEN AND PELVIS WITHOUT CONTRAST
TECHNIQUE: Multidetector CT imaging of the chest, abdomen and
pelvis was performed following the standard protocol without IV
contrast.

[Series 2: cap 5.0 b40f · axial · 0.69mm/px · z∈[-568,-43]mm · 14 of 117 slices shown, 16 images]
[im 6/117  soft-tissue]
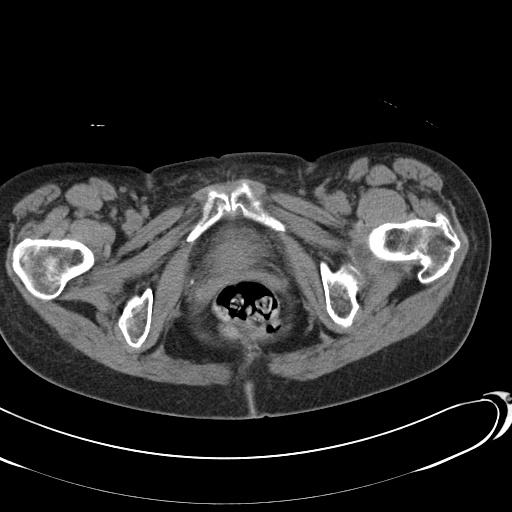
[im 6/117  bone]
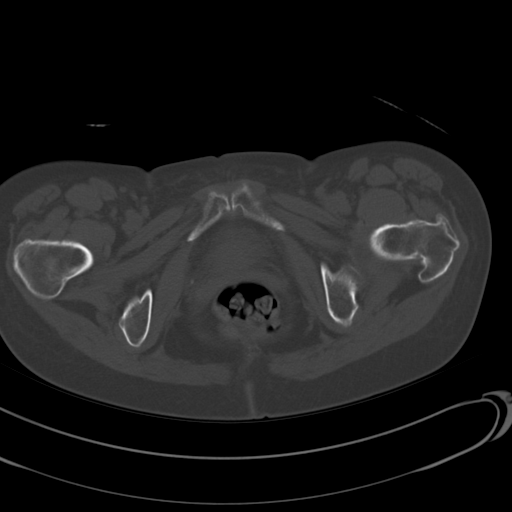
[im 18/117  soft-tissue]
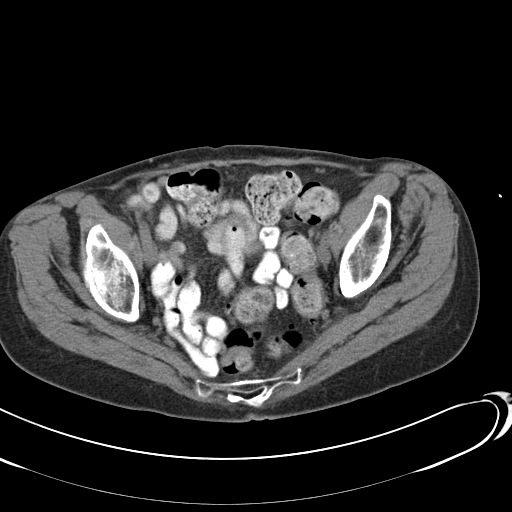
[im 24/117  soft-tissue]
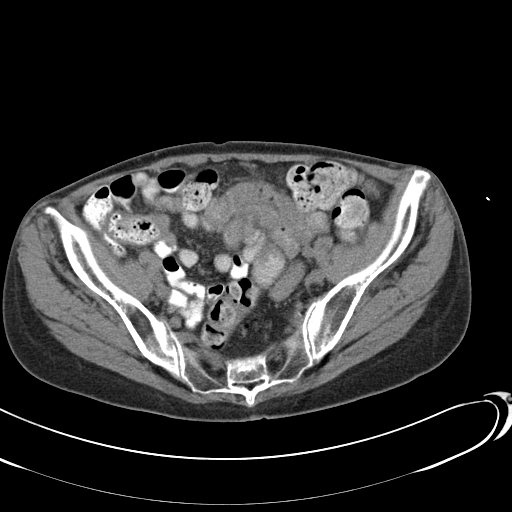
[im 30/117  soft-tissue]
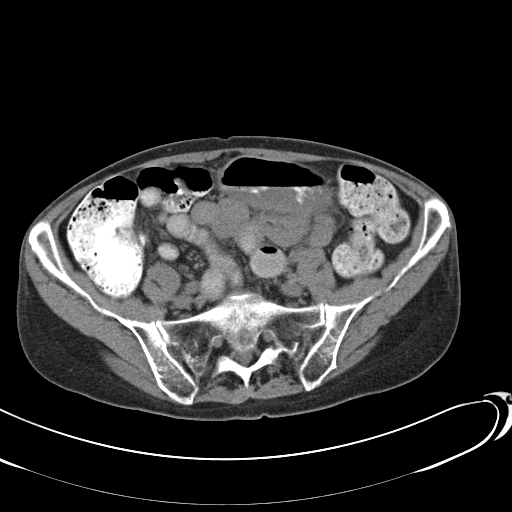
[im 41/117  soft-tissue]
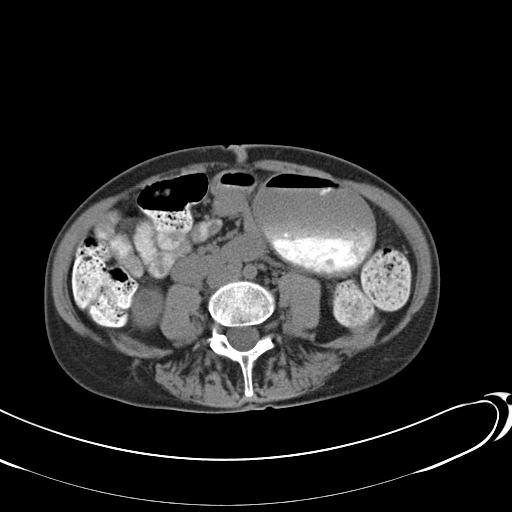
[im 47/117  soft-tissue]
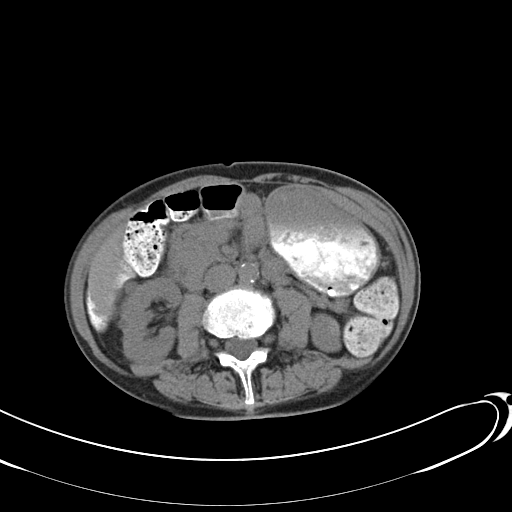
[im 53/117  soft-tissue]
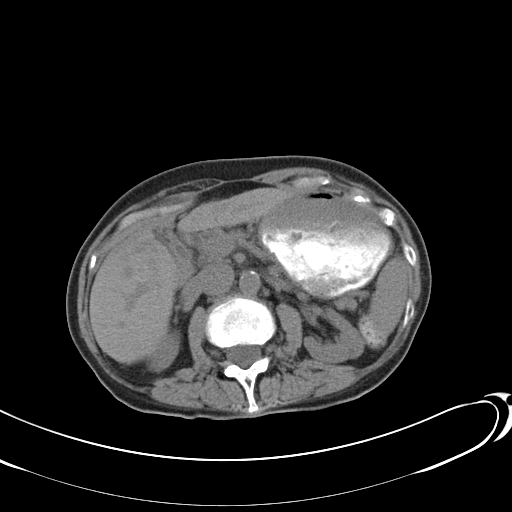
[im 64/117  soft-tissue]
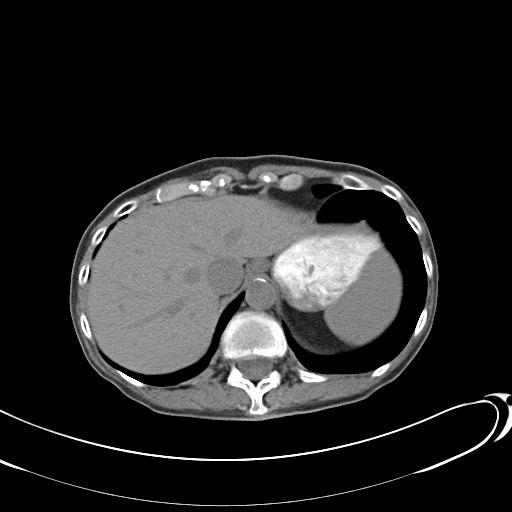
[im 70/117  soft-tissue]
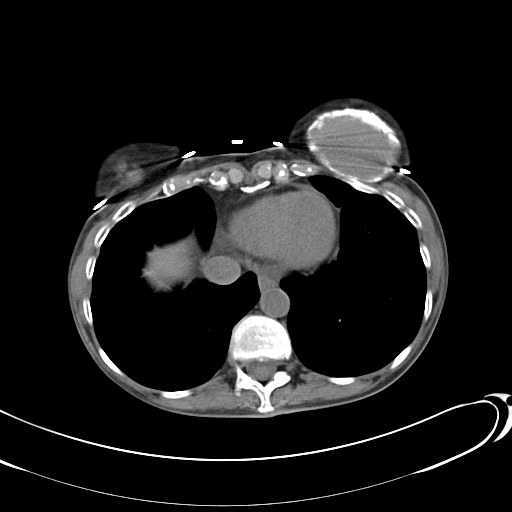
[im 70/117  bone]
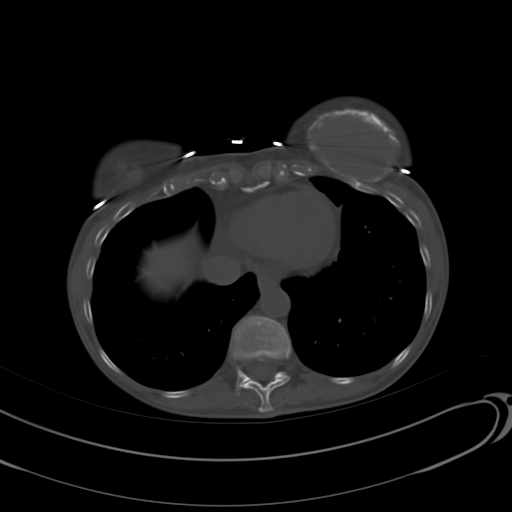
[im 76/117  soft-tissue]
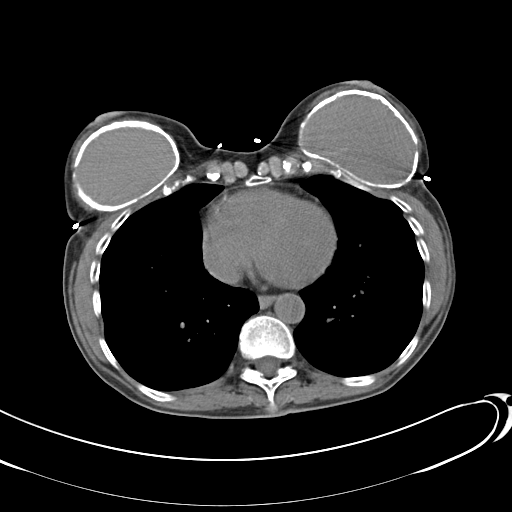
[im 88/117  soft-tissue]
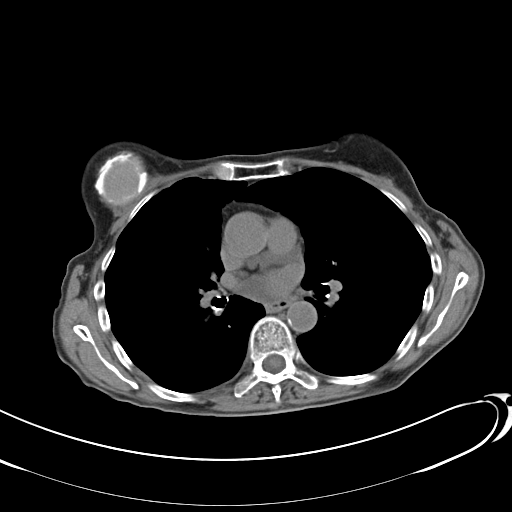
[im 93/117  soft-tissue]
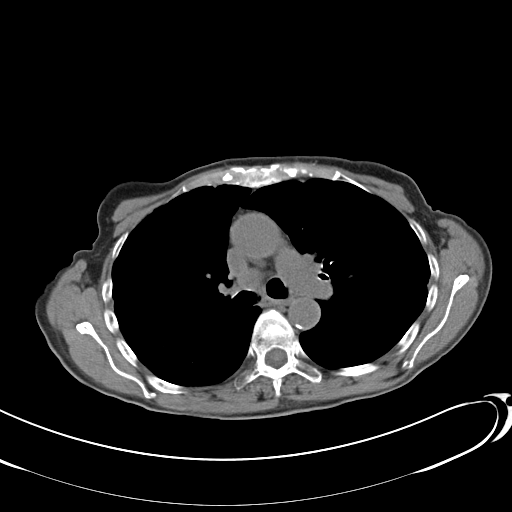
[im 99/117  soft-tissue]
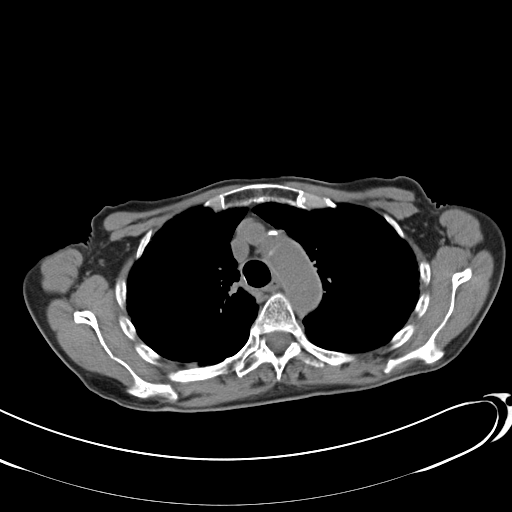
[im 111/117  soft-tissue]
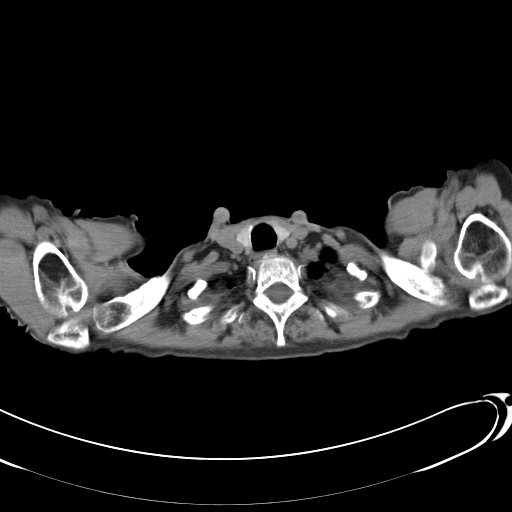

[Series 602: <mpr thick range> · coronal · 1.14mm/px · 3 of 66 slices shown]
[im 22/66  soft-tissue]
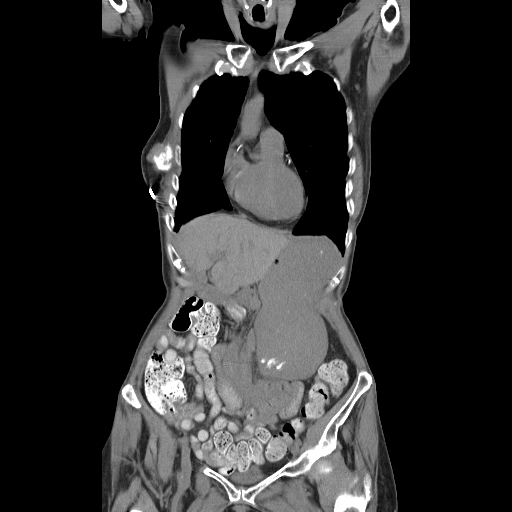
[im 29/66  soft-tissue]
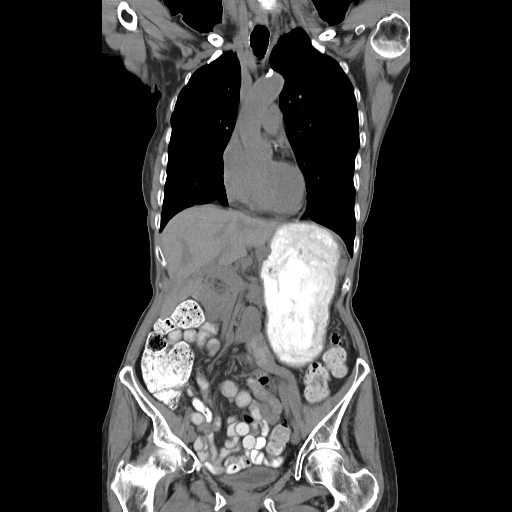
[im 37/66  soft-tissue]
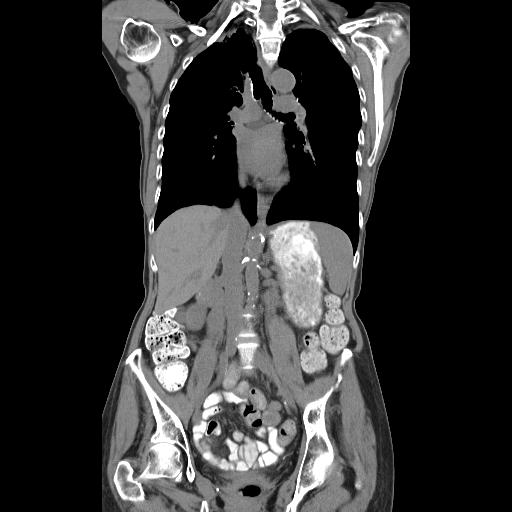

[17 of 46 positions shown; findings below may reference images not displayed]

FINDINGS: No pathologically enlarged mediastinal, hilar or
axillary lymph nodes.  Heart size normal.  There is coronary artery
calcification.  No pericardial effusion.

There is biapical scarring, right greater than left.  Suspect
radiation fibrosis in the right apex as well.  Lungs otherwise
clear.  No pleural fluid.  Airway unremarkable.
IMPRESSION: 1.  No evidence of metastatic disease in the chest.

CT ABDOMEN
FINDINGS: Liver, gallbladder, adrenal glands unremarkable.  A soft
tissue density lesion exophytic off the lower pole of the right
kidney, measures 1.4 cm and 34 HU (image 73), and is not definitely
seen on the prior study.  It is seen inferior to a stable low
attenuation lesion in the interpolar right kidney. A hyperdense
cm lesion off the lower pole of the left kidney is stable.  There
are other lesions of varying density in both kidneys, which are
difficult to fully characterize.

Spleen, pancreas, stomach and small bowel are unremarkable.  No
pathologically enlarged lymph nodes.  No free fluid.
IMPRESSION: 1.  No evidence of metastatic disease in the abdomen.
2.  Bilateral renal lesions of varying density and stability.  A
soft tissue density lesion in the right kidney is not well seen on
the prior study and neoplasm cannot be excluded. MR Francisc be useful
in further evaluation, as clinically indicated.

CT PELVIS
FINDINGS: Uterus is absent.  Ovaries are not definitely visualized.
Colon unremarkable.  No pathologically enlarged lymph nodes.  No
free fluid.  No worrisome lytic or sclerotic lesions.
IMPRESSION: 1.  No evidence of metastatic disease in the pelvis.

## 2010-04-21 ENCOUNTER — Ambulatory Visit: Payer: Self-pay | Admitting: Oncology

## 2010-04-22 LAB — CBC WITH DIFFERENTIAL/PLATELET
BASO%: 0.6 % (ref 0.0–2.0)
Eosinophils Absolute: 0.1 10*3/uL (ref 0.0–0.5)
HGB: 11.8 g/dL (ref 11.6–15.9)
MONO#: 0.4 10*3/uL (ref 0.1–0.9)
NEUT#: 2.2 10*3/uL (ref 1.5–6.5)
NEUT%: 58.4 % (ref 38.4–76.8)
Platelets: 143 10*3/uL — ABNORMAL LOW (ref 145–400)
RBC: 3.67 10*6/uL — ABNORMAL LOW (ref 3.70–5.45)
RDW: 13.9 % (ref 11.2–14.5)
WBC: 3.8 10*3/uL — ABNORMAL LOW (ref 3.9–10.3)
lymph#: 1 10*3/uL (ref 0.9–3.3)

## 2010-04-23 LAB — COMPREHENSIVE METABOLIC PANEL
ALT: 9 U/L (ref 0–35)
AST: 19 U/L (ref 0–37)
BUN: 24 mg/dL — ABNORMAL HIGH (ref 6–23)
Chloride: 98 mEq/L (ref 96–112)
Creatinine, Ser: 1.42 mg/dL — ABNORMAL HIGH (ref 0.40–1.20)
Glucose, Bld: 97 mg/dL (ref 70–99)
Potassium: 4.1 mEq/L (ref 3.5–5.3)
Total Protein: 7.5 g/dL (ref 6.0–8.3)

## 2010-10-08 ENCOUNTER — Ambulatory Visit: Payer: Self-pay | Admitting: Oncology

## 2010-10-12 LAB — CBC WITH DIFFERENTIAL/PLATELET
Basophils Absolute: 0 10*3/uL (ref 0.0–0.1)
EOS%: 2.3 % (ref 0.0–7.0)
HCT: 35.3 % (ref 34.8–46.6)
HGB: 12.1 g/dL (ref 11.6–15.9)
LYMPH%: 24.4 % (ref 14.0–49.7)
MCH: 31.7 pg (ref 25.1–34.0)
MCHC: 34.4 g/dL (ref 31.5–36.0)
MONO#: 0.4 10*3/uL (ref 0.1–0.9)
MONO%: 8.1 % (ref 0.0–14.0)
NEUT%: 64.7 % (ref 38.4–76.8)
Platelets: 164 10*3/uL (ref 145–400)
RBC: 3.83 10*6/uL (ref 3.70–5.45)
WBC: 4.6 10*3/uL (ref 3.9–10.3)
lymph#: 1.1 10*3/uL (ref 0.9–3.3)

## 2010-10-12 LAB — COMPREHENSIVE METABOLIC PANEL
ALT: 11 U/L (ref 0–35)
Albumin: 4.3 g/dL (ref 3.5–5.2)
BUN: 40 mg/dL — ABNORMAL HIGH (ref 6–23)
Glucose, Bld: 110 mg/dL — ABNORMAL HIGH (ref 70–99)
Total Bilirubin: 0.6 mg/dL (ref 0.3–1.2)
Total Protein: 8.1 g/dL (ref 6.0–8.3)

## 2010-12-26 ENCOUNTER — Encounter: Payer: Self-pay | Admitting: Diagnostic Radiology

## 2011-04-19 NOTE — Procedures (Signed)
DUPLEX DEEP VENOUS EXAM - LOWER EXTREMITY   INDICATION:  Left lower leg pain.   HISTORY:  Edema:  No.  Trauma/Surgery:  No.  Pain:  Yes.  PE:  No.  Previous DVT:  Yes.  Anticoagulants:  Other:   DUPLEX EXAM:                CFV   SFV   PopV  PTV    GSV                R  L  R  L  R  L  R   L  R  L  Thrombosis    0  0     0     0      0     0  Spontaneous   +  +  +  +     +      +     +  Phasic        +  +  +  +     +      +     +  Augmentation  +  +  +  +     +      +     +  Compressible  +  +  +  +     +      +     +  Competent     +  +  +  +     +      +     +   Legend:  + - yes  o - no  p - partial  D - decreased   IMPRESSION:  No evidence of DVT noted in the left leg.   Attempted to call doctor's office and it was closed for the day.     _____________________________  Janetta Hora. Fields, MD   MG/MEDQ  D:  07/17/2008  T:  07/17/2008  Job:  [id number]

## 2011-04-21 ENCOUNTER — Other Ambulatory Visit: Payer: Self-pay | Admitting: Oncology

## 2011-04-21 ENCOUNTER — Encounter (HOSPITAL_BASED_OUTPATIENT_CLINIC_OR_DEPARTMENT_OTHER): Payer: Medicare Other | Admitting: Oncology

## 2011-04-21 DIAGNOSIS — C50919 Malignant neoplasm of unspecified site of unspecified female breast: Secondary | ICD-10-CM

## 2011-04-21 DIAGNOSIS — M810 Age-related osteoporosis without current pathological fracture: Secondary | ICD-10-CM

## 2011-04-21 DIAGNOSIS — Z170 Estrogen receptor positive status [ER+]: Secondary | ICD-10-CM

## 2011-04-21 LAB — CBC WITH DIFFERENTIAL/PLATELET
Basophils Absolute: 0.1 10*3/uL (ref 0.0–0.1)
EOS%: 3.1 % (ref 0.0–7.0)
Eosinophils Absolute: 0.1 10*3/uL (ref 0.0–0.5)
HCT: 33.3 % — ABNORMAL LOW (ref 34.8–46.6)
LYMPH%: 24.8 % (ref 14.0–49.7)
MCH: 31.6 pg (ref 25.1–34.0)
MCV: 92.1 fL (ref 79.5–101.0)
MONO#: 0.4 10*3/uL (ref 0.1–0.9)
MONO%: 8.2 % (ref 0.0–14.0)
NEUT%: 62.3 % (ref 38.4–76.8)
Platelets: 143 10*3/uL — ABNORMAL LOW (ref 145–400)
RDW: 13.1 % (ref 11.2–14.5)

## 2011-04-22 LAB — VITAMIN D 25 HYDROXY (VIT D DEFICIENCY, FRACTURES): Vit D, 25-Hydroxy: 46 ng/mL (ref 30–89)

## 2011-04-22 LAB — COMPREHENSIVE METABOLIC PANEL
Albumin: 4.6 g/dL (ref 3.5–5.2)
Alkaline Phosphatase: 55 U/L (ref 39–117)
CO2: 28 mEq/L (ref 19–32)
Calcium: 10.7 mg/dL — ABNORMAL HIGH (ref 8.4–10.5)
Chloride: 97 mEq/L (ref 96–112)
Glucose, Bld: 102 mg/dL — ABNORMAL HIGH (ref 70–99)
Sodium: 138 mEq/L (ref 135–145)

## 2011-04-28 ENCOUNTER — Encounter (HOSPITAL_BASED_OUTPATIENT_CLINIC_OR_DEPARTMENT_OTHER): Payer: Medicare Other | Admitting: Oncology

## 2011-04-28 DIAGNOSIS — C50919 Malignant neoplasm of unspecified site of unspecified female breast: Secondary | ICD-10-CM

## 2011-04-28 DIAGNOSIS — Z170 Estrogen receptor positive status [ER+]: Secondary | ICD-10-CM

## 2011-06-09 ENCOUNTER — Other Ambulatory Visit: Payer: Self-pay | Admitting: Internal Medicine

## 2011-10-20 ENCOUNTER — Ambulatory Visit (HOSPITAL_BASED_OUTPATIENT_CLINIC_OR_DEPARTMENT_OTHER): Payer: Medicare Other | Admitting: Oncology

## 2011-10-20 ENCOUNTER — Other Ambulatory Visit: Payer: Self-pay | Admitting: Oncology

## 2011-10-20 ENCOUNTER — Other Ambulatory Visit (HOSPITAL_BASED_OUTPATIENT_CLINIC_OR_DEPARTMENT_OTHER): Payer: Medicare Other | Admitting: Lab

## 2011-10-20 ENCOUNTER — Encounter: Payer: Self-pay | Admitting: *Deleted

## 2011-10-20 VITALS — BP 153/69 | HR 87 | Temp 97.7°F | Ht 63.0 in | Wt 95.4 lb

## 2011-10-20 DIAGNOSIS — Z853 Personal history of malignant neoplasm of breast: Secondary | ICD-10-CM

## 2011-10-20 DIAGNOSIS — C50919 Malignant neoplasm of unspecified site of unspecified female breast: Secondary | ICD-10-CM

## 2011-10-20 LAB — COMPREHENSIVE METABOLIC PANEL
ALT: 10 U/L (ref 0–35)
AST: 22 U/L (ref 0–37)
Alkaline Phosphatase: 88 U/L (ref 39–117)
Chloride: 97 mEq/L (ref 96–112)
Creatinine, Ser: 1.43 mg/dL — ABNORMAL HIGH (ref 0.50–1.10)
Total Bilirubin: 0.2 mg/dL — ABNORMAL LOW (ref 0.3–1.2)

## 2011-10-20 LAB — CBC WITH DIFFERENTIAL/PLATELET
BASO%: 0.8 % (ref 0.0–2.0)
EOS%: 2.5 % (ref 0.0–7.0)
HGB: 12 g/dL (ref 11.6–15.9)
LYMPH%: 20 % (ref 14.0–49.7)
MCV: 91.4 fL (ref 79.5–101.0)
MONO#: 0.3 10*3/uL (ref 0.1–0.9)
NEUT#: 3.3 10*3/uL (ref 1.5–6.5)
NEUT%: 70.2 % (ref 38.4–76.8)
Platelets: 161 10*3/uL (ref 145–400)
RBC: 3.89 10*6/uL (ref 3.70–5.45)
RDW: 13.9 % (ref 11.2–14.5)
WBC: 4.7 10*3/uL (ref 3.9–10.3)

## 2011-10-20 NOTE — Progress Notes (Signed)
ID: Ashok Cordia Kopp   Interval History:  Brooke Conley returns today for followup of her breast cancer. The interval history is generally unremarkable. She does all her housework shopping driving and "I do things for my son, and my daughter" all of which keeps her busy.  ROS:  When asked what her worse problem is she has difficulty finding an active problem but what has been worrying her the most R. her skin cancers. 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 Otherwise detailed review of systems today was unremarkable  Medications: I have reviewed the patient's current medications.   Current Outpatient Prescriptions  Medication Sig Dispense Refill  . anastrozole (ARIMIDEX) 1 MG tablet Take 1 mg by mouth daily.           Objective: Vital signs in last 24 hours: BP 153/69  Pulse 87  Temp 97.7 F (36.5 C)  Ht 5\' 3"  (1.6 m)  Wt 95 lb 6.4 oz (43.273 kg)  BMI 16.90 kg/m2   Physical Exam:    Sclerae unicteric  Oropharynx clear  No peripheral adenopathy  Lungs clear -- no rales or rhonchi  Heart regular rate and rhythm  Abdomen benign  MSK no focal spinal tenderness, no peripheral edema  Neuro nonfocal  Breast exam:  Right breast has an implant in place. There is no evidence of any a right axillary or breast masses no evidence of local recurrence left breast no suspicious masses  Lab Results:   CMP  Lab Results  Component Value Date   GLUCOSE 102* 04/21/2011   GLUCOSE 102* 04/21/2011   ALT 8 04/21/2011   ALT 8 04/21/2011   AST 20 04/21/2011   AST 20 04/21/2011   NA 138 04/21/2011   NA 138 04/21/2011   K 3.6 04/21/2011   K 3.6 04/21/2011   CL 97 04/21/2011   CL 97 04/21/2011   CREATININE 1.56* 04/21/2011   CREATININE 1.56* 04/21/2011   BUN 29* 04/21/2011   BUN 29* 04/21/2011   CO2 28 04/21/2011   CO2 28 04/21/2011   INR 1.90* 02/06/2007     Lab Results  Component  Value Date   WBC 4.7 10/20/2011   HGB 12.0 10/20/2011   HCT 35.5 10/20/2011   MCV 91.4 10/20/2011   PLT 161 10/20/2011        Studies/Results: Mammography at [facility] this fall showed no significant change per patient report. I do not have a copy of the actual study  Assessment:  77 year old [place] woman status post resection of a right axillary mass February of 2007, estrogen receptor positive at 8%, progesterone receptor and HER-2 negative, with a high proliferation fraction, grade 3, and so stage II. She received 6 cycles of cyclophosphamide methotrexate and fluorouracil completed August of 2007, followed by radiation completed November of 2007 at which time she started anastrozole   Plan:  We discussed the fact that we do not have data for continuing aromatase inhibitors beyond 5 years. Accordingly the standard of therapy is to stop at this time. I am comfortable releasing her back to her primary care physician. She understands all she needs from this point is yearly mammography and a yearly physician breast exam. Certainly if any developments occur that would concern her regarding breast cancer recurrence we will be glad to see her again. As of now of her no further appointments have been made  for her here. Incidentally I offered her participation in our survivorship clinic but she was not interested. She did express an interest in volunteering here and we will followup on that.  MAGRINAT,GUSTAV C 10/20/2011

## 2012-07-03 ENCOUNTER — Other Ambulatory Visit: Payer: Self-pay | Admitting: Dermatology

## 2013-10-29 ENCOUNTER — Telehealth: Payer: Self-pay | Admitting: *Deleted

## 2013-10-29 ENCOUNTER — Other Ambulatory Visit: Payer: Self-pay | Admitting: Internal Medicine

## 2013-10-29 DIAGNOSIS — E039 Hypothyroidism, unspecified: Secondary | ICD-10-CM

## 2013-10-29 MED ORDER — LEVOTHYROXINE SODIUM 50 MCG PO TABS: 50.0000 ug | ORAL_TABLET | Freq: Every day | ORAL | Status: DC

## 2013-10-29 NOTE — Telephone Encounter (Signed)
90 DAY RX TO [facility]  LEVOTHYROXINE #[id number]

## 2014-05-31 ENCOUNTER — Other Ambulatory Visit: Payer: Self-pay | Admitting: Internal Medicine

## 2014-06-22 DIAGNOSIS — M810 Age-related osteoporosis without current pathological fracture: Secondary | ICD-10-CM | POA: Insufficient documentation

## 2014-06-22 DIAGNOSIS — E559 Vitamin D deficiency, unspecified: Secondary | ICD-10-CM | POA: Insufficient documentation

## 2014-06-22 DIAGNOSIS — Z8542 Personal history of malignant neoplasm of other parts of uterus: Secondary | ICD-10-CM | POA: Insufficient documentation

## 2014-06-22 DIAGNOSIS — Z853 Personal history of malignant neoplasm of breast: Secondary | ICD-10-CM | POA: Insufficient documentation

## 2014-06-22 DIAGNOSIS — R7303 Prediabetes: Secondary | ICD-10-CM | POA: Insufficient documentation

## 2014-06-22 DIAGNOSIS — I10 Essential (primary) hypertension: Secondary | ICD-10-CM | POA: Insufficient documentation

## 2014-06-22 DIAGNOSIS — E785 Hyperlipidemia, unspecified: Secondary | ICD-10-CM | POA: Insufficient documentation

## 2014-06-26 ENCOUNTER — Other Ambulatory Visit: Payer: Self-pay | Admitting: Internal Medicine

## 2014-06-26 ENCOUNTER — Encounter: Payer: Self-pay | Admitting: Internal Medicine

## 2014-06-26 ENCOUNTER — Ambulatory Visit (INDEPENDENT_AMBULATORY_CARE_PROVIDER_SITE_OTHER): Payer: Medicare Other | Admitting: Internal Medicine

## 2014-06-26 VITALS — BP 130/64 | HR 78 | Temp 98.4°F | Resp 16 | Ht 62.0 in | Wt 97.0 lb

## 2014-06-26 DIAGNOSIS — Z1331 Encounter for screening for depression: Secondary | ICD-10-CM

## 2014-06-26 DIAGNOSIS — E785 Hyperlipidemia, unspecified: Secondary | ICD-10-CM

## 2014-06-26 DIAGNOSIS — E559 Vitamin D deficiency, unspecified: Secondary | ICD-10-CM

## 2014-06-26 DIAGNOSIS — R7303 Prediabetes: Secondary | ICD-10-CM

## 2014-06-26 DIAGNOSIS — Z79899 Other long term (current) drug therapy: Secondary | ICD-10-CM | POA: Insufficient documentation

## 2014-06-26 DIAGNOSIS — I10 Essential (primary) hypertension: Secondary | ICD-10-CM

## 2014-06-26 DIAGNOSIS — Z789 Other specified health status: Secondary | ICD-10-CM

## 2014-06-26 DIAGNOSIS — Z1212 Encounter for screening for malignant neoplasm of rectum: Secondary | ICD-10-CM

## 2014-06-26 DIAGNOSIS — Z0000 Encounter for general adult medical examination without abnormal findings: Secondary | ICD-10-CM

## 2014-06-26 LAB — CBC WITH DIFFERENTIAL/PLATELET
BASOS ABS: 0 10*3/uL (ref 0.0–0.1)
Basophils Relative: 0 % (ref 0–1)
Eosinophils Absolute: 0.1 10*3/uL (ref 0.0–0.7)
Eosinophils Relative: 2 % (ref 0–5)
HEMATOCRIT: 33.3 % — AB (ref 36.0–46.0)
HEMOGLOBIN: 11.3 g/dL — AB (ref 12.0–15.0)
LYMPHS ABS: 1 10*3/uL (ref 0.7–4.0)
Lymphocytes Relative: 20 % (ref 12–46)
MCH: 29.7 pg (ref 26.0–34.0)
MCHC: 33.9 g/dL (ref 30.0–36.0)
MCV: 87.4 fL (ref 78.0–100.0)
MONO ABS: 0.4 10*3/uL (ref 0.1–1.0)
MONOS PCT: 8 % (ref 3–12)
NEUTROS ABS: 3.5 10*3/uL (ref 1.7–7.7)
Neutrophils Relative %: 70 % (ref 43–77)
Platelets: 140 10*3/uL — ABNORMAL LOW (ref 150–400)
RBC: 3.81 MIL/uL — AB (ref 3.87–5.11)
RDW: 13.8 % (ref 11.5–15.5)
WBC: 5 10*3/uL (ref 4.0–10.5)

## 2014-06-26 LAB — HEMOGLOBIN A1C
Hgb A1c MFr Bld: 6.2 % — ABNORMAL HIGH (ref <5.7)
Mean Plasma Glucose: 131 mg/dL — ABNORMAL HIGH (ref <117)

## 2014-06-26 NOTE — Progress Notes (Signed)
Patient ID: Brooke Conley, female   DOB: 1926/12/10, 80 y.o.   MRN: [id number]   Annual Screening Comprehensive Examination  This very nice 80 y.o.DWF presents for complete physical.  Patient has been followed for HTN, Hypothyroidism,  Prediabetes, Hyperlipidemia, and Vitamin D Deficiency. Patient has been released by Dr Jana Hakim for hx/o Rt breast cancer. Patient was begun on thyroid replacement last July  For elevated TSH.    Patient has HTN predating  since  1979.  Patient's BP has been controlled with a diuretic. Today's BP: 130/64 mmHg. Patient denies any cardiac symptoms as chest pain, palpitations, shortness of breath, dizziness or ankle swelling.   Patient's hyperlipidemia is controlled with diet and medications. Patient denies myalgias or other medication SE's. Last lipids were at goal with Chol 196, TG 79, HDL 91 & LDL 89 in July 2014.    Patient has prediabetes with A1c 5.9%  predating since July 2013 and last A1c was 5.8% in July 2014. Patient denies reactive hypoglycemic symptoms, visual blurring, diabetic polys, or paresthesias.    Finally, patient has history of Vitamin D Deficiency of 46 in 2012 and last Vitamin D was 9 in May 2015.   Medication Sig Refill  . BABY ASPIRIN PO Take 81 mg by mouth daily.    Marland Kitchen levothyroxine (SYNTHROID, LEVOTHROID) 50 MCG tablet Take 1 tablet (50 mcg total) by mouth daily before breakfast.  99    Tamoxifen 20 mg    . triamterene-hctz  37.5-25 MG  Take 1 tablet by mouth  every day   0   Allergies  Allergen Reactions  . Penicillins    Past Medical History  Diagnosis Date  . Prediabetes   . Hypertension   . Hyperlipidemia   . Osteoporosis   . History of breast cancer   . Vitamin D deficiency   . History of uterine cancer 1982   Past Surgical History  Procedure Laterality Date  . Cosmetic surgery Bilateral 1979    Breast  . Breast surgery Right 2007    lumpectomy  . Tonsillectomy    . Cataract extraction Right 2010  . Total  abdominal hysterectomy w/ bilateral salpingoophorectomy     Family History  Problem Relation Age of Onset  . Cancer Mother     lung  . Hypertension Father    History  Substance Use Topics  . Smoking status: Never Smoker   . Smokeless tobacco: Never Used  . Alcohol Use: No    ROS Constitutional: Denies fever, chills, weight loss/gain, headaches, insomnia, fatigue, night sweats, and change in appetite. Eyes: Denies redness, blurred vision, diplopia, discharge, itchy, watery eyes.  ENT: Denies discharge, congestion, post nasal drip, epistaxis, sore throat, earache, hearing loss, dental pain, Tinnitus, Vertigo, Sinus pain, snoring.  Cardio: Denies chest pain, palpitations, irregular heartbeat, syncope, dyspnea, diaphoresis, orthopnea, PND, claudication, edema Respiratory: denies cough, dyspnea, DOE, pleurisy, hoarseness, laryngitis, wheezing.  Gastrointestinal: Denies dysphagia, heartburn, reflux, water brash, pain, cramps, nausea, vomiting, bloating, diarrhea, constipation, hematemesis, melena, hematochezia, jaundice, hemorrhoids Genitourinary: Denies dysuria, frequency, urgency, nocturia, hesitancy, discharge, hematuria, flank pain Breast: Breast lumps, nipple discharge, bleeding.  Musculoskeletal: Denies arthralgia, myalgia, stiffness, Jt. Swelling, pain, limp, and strain/sprain. Denies falls. Skin: Denies puritis, rash, hives, warts, acne, eczema, changing in skin lesion Neuro: No weakness, tremor, incoordination, spasms, paresthesia, pain Psychiatric: Denies confusion, memory loss, sensory loss. Denies Depression. Endocrine: Denies change in weight, skin, hair change, nocturia, and paresthesia, diabetic polys, visual blurring, hyper / hypo glycemic episodes.  Heme/Lymph: No excessive  bleeding, bruising, enlarged lymph nodes.  Physical Exam  BP 130/64  Pulse 78  Temp(Src) 98.4 F (36.9 C) (Temporal)  Resp 16  Ht 5\' 2"  (1.575 m)  Wt 97 lb (43.999 kg)  BMI 17.74 kg/m2   General  Appearance: Thin and in no apparent distress. Eyes: PERRLA, EOMs, conjunctiva no swelling or erythema, normal fundi and vessels. Sinuses: No frontal/maxillary tenderness ENT/Mouth: EACs patent / TMs  nl. Nares clear without erythema, swelling, mucoid exudates. Oral hygiene is good. No erythema, swelling, or exudate. Tongue normal, non-obstructing. Tonsils not swollen or erythematous. Hearing normal.  Neck: Supple, thyroid normal. No bruits, nodes or JVD. Respiratory: Respiratory effort normal.  BS equal and clear bilateral without rales, rhonci, wheezing or stridor. Cardio: Heart sounds are normal with regular rate and rhythm and no murmurs, rubs or gallops. Peripheral pulses are normal and equal bilaterally without edema. No aortic or femoral bruits. Chest: symmetric with normal excursions and percussion. Breasts: Symmetric, without lumps, nipple discharge, retractions, or fibrocystic changes.  Abdomen: Flat, soft, with bowl sounds. Nontender, no guarding, rebound, hernias, masses, or organomegaly.  Lymphatics: Non tender without lymphadenopathy.  Genitourinary:  Musculoskeletal: Full ROM all peripheral extremities, joint stability, 5/5 strength, and normal gait. Skin: Warm and dry without rashes, lesions, cyanosis, clubbing or  ecchymosis.  Neuro: Cranial nerves intact, reflexes equal bilaterally. Normal muscle tone, no cerebellar symptoms. Sensation intact.  Pysch: Awake and oriented X 3, normal affect, Insight and Judgment appropriate.   Assessment and Plan  1. Annual Screening Examination 2. Hypertension  3. Hyperlipidemia 4. Pre Diabetes 5. Vitamin D Deficiency 6. Hypothyroisism 7 . Hx/o Breast Cancer, right (2002)  Continue prudent diet as discussed, weight control, BP monitoring, regular exercise, and medications. Discussed med's effects and SE's. Screening labs and tests as requested with regular follow-up as recommended.

## 2014-06-26 NOTE — Patient Instructions (Signed)
Preventive Care for Adults A healthy lifestyle and preventive care can promote health and wellness. Preventive health guidelines for women include the following key practices.  A routine yearly physical is a good way to check with your health care provider about your health and preventive screening. It is a chance to share any concerns and updates on your health and to receive a thorough exam.  Visit your dentist for a routine exam and preventive care every 6 months. Brush your teeth twice a day and floss once a day. Good oral hygiene prevents tooth decay and gum disease.  The frequency of eye exams is based on your age, health, family medical history, use of contact lenses, and other factors. Follow your health care provider's recommendations for frequency of eye exams.  Eat a healthy diet. Foods like vegetables, fruits, whole grains, low-fat dairy products, and lean protein foods contain the nutrients you need without too many calories. Decrease your intake of foods high in solid fats, added sugars, and salt. Eat the right amount of calories for you.Get information about a proper diet from your health care provider, if necessary.  Regular physical exercise is one of the most important things you can do for your health. Most adults should get at least 150 minutes of moderate-intensity exercise (any activity that increases your heart rate and causes you to sweat) each week. In addition, most adults need muscle-strengthening exercises on 2 or more days a week.  Maintain a healthy weight. The body mass index (BMI) is a screening tool to identify possible weight problems. It provides an estimate of body fat based on height and weight. Your health care provider can find your BMI and can help you achieve or maintain a healthy weight.For adults 20 years and older:  A BMI below 18.5 is considered underweight.  A BMI of 18.5 to 24.9 is normal.  A BMI of 25 to 29.9 is considered overweight.  A BMI of  30 and above is considered obese.  Maintain normal blood lipids and cholesterol levels by exercising and minimizing your intake of saturated fat. Eat a balanced diet with plenty of fruit and vegetables. Blood tests for lipids and cholesterol should begin at age 76 and be repeated every 5 years. If your lipid or cholesterol levels are high, you are over 50, or you are at high risk for heart disease, you may need your cholesterol levels checked more frequently.Ongoing high lipid and cholesterol levels should be treated with medicines if diet and exercise are not working.  If you smoke, find out from your health care provider how to quit. If you do not use tobacco, do not start.  Lung cancer screening is recommended for adults aged 22-80 years who are at high risk for developing lung cancer because of a history of smoking. A yearly low-dose CT scan of the lungs is recommended for people who have at least a 30-pack-year history of smoking and are a current smoker or have quit within the past 15 years. A pack year of smoking is smoking an average of 1 pack of cigarettes a day for 1 year (for example: 1 pack a day for 30 years or 2 packs a day for 15 years). Yearly screening should continue until the smoker has stopped smoking for at least 15 years. Yearly screening should be stopped for people who develop a health problem that would prevent them from having lung cancer treatment.  If you are pregnant, do not drink alcohol. If you are breastfeeding,  be very cautious about drinking alcohol. If you are not pregnant and choose to drink alcohol, do not have more than 1 drink per day. One drink is considered to be 12 ounces (355 mL) of beer, 5 ounces (148 mL) of wine, or 1.5 ounces (44 mL) of liquor.  Avoid use of street drugs. Do not share needles with anyone. Ask for help if you need support or instructions about stopping the use of drugs.  High blood pressure causes heart disease and increases the risk of  stroke. Your blood pressure should be checked at least every 1 to 2 years. Ongoing high blood pressure should be treated with medicines if weight loss and exercise do not work.  If you are 75-52 years old, ask your health care provider if you should take aspirin to prevent strokes.  Diabetes screening involves taking a blood sample to check your fasting blood sugar level. This should be done once every 3 years, after age 15, if you are within normal weight and without risk factors for diabetes. Testing should be considered at a younger age or be carried out more frequently if you are overweight and have at least 1 risk factor for diabetes.  Breast cancer screening is essential preventive care for women. You should practice "breast self-awareness." This means understanding the normal appearance and feel of your breasts and may include breast self-examination. Any changes detected, no matter how small, should be reported to a health care provider. Women in their 58s and 30s should have a clinical breast exam (CBE) by a health care provider as part of a regular health exam every 1 to 3 years. After age 16, women should have a CBE every year. Starting at age 53, women should consider having a mammogram (breast X-ray test) every year. Women who have a family history of breast cancer should talk to their health care provider about genetic screening. Women at a high risk of breast cancer should talk to their health care providers about having an MRI and a mammogram every year.  Breast cancer gene (BRCA)-related cancer risk assessment is recommended for women who have family members with BRCA-related cancers. BRCA-related cancers include breast, ovarian, tubal, and peritoneal cancers. Having family members with these cancers may be associated with an increased risk for harmful changes (mutations) in the breast cancer genes BRCA1 and BRCA2. Results of the assessment will determine the need for genetic counseling and  BRCA1 and BRCA2 testing.  Routine pelvic exams to screen for cancer are no longer recommended for nonpregnant women who are considered low risk for cancer of the pelvic organs (ovaries, uterus, and vagina) and who do not have symptoms. Ask your health care provider if a screening pelvic exam is right for you.  If you have had past treatment for cervical cancer or a condition that could lead to cancer, you need Pap tests and screening for cancer for at least 20 years after your treatment. If Pap tests have been discontinued, your risk factors (such as having a new sexual partner) need to be reassessed to determine if screening should be resumed. Some women have medical problems that increase the chance of getting cervical cancer. In these cases, your health care provider may recommend more frequent screening and Pap tests.  The HPV test is an additional test that may be used for cervical cancer screening. The HPV test looks for the virus that can cause the cell changes on the cervix. The cells collected during the Pap test can be  tested for HPV. The HPV test could be used to screen women aged 30 years and older, and should be used in women of any age who have unclear Pap test results. After the age of 30, women should have HPV testing at the same frequency as a Pap test.  Colorectal cancer can be detected and often prevented. Most routine colorectal cancer screening begins at the age of 50 years and continues through age 75 years. However, your health care provider may recommend screening at an earlier age if you have risk factors for colon cancer. On a yearly basis, your health care provider may provide home test kits to check for hidden blood in the stool. Use of a small camera at the end of a tube, to directly examine the colon (sigmoidoscopy or colonoscopy), can detect the earliest forms of colorectal cancer. Talk to your health care provider about this at age 50, when routine screening begins. Direct  exam of the colon should be repeated every 5-10 years through age 75 years, unless early forms of pre-cancerous polyps or small growths are found.  People who are at an increased risk for hepatitis B should be screened for this virus. You are considered at high risk for hepatitis B if:  You were born in a country where hepatitis B occurs often. Talk with your health care provider about which countries are considered high risk.  Your parents were born in a high-risk country and you have not received a shot to protect against hepatitis B (hepatitis B vaccine).  You have HIV or AIDS.  You use needles to inject street drugs.  You live with, or have sex with, someone who has hepatitis B.  You get hemodialysis treatment.  You take certain medicines for conditions like cancer, organ transplantation, and autoimmune conditions.  Hepatitis C blood testing is recommended for all people born from 1945 through 1965 and any individual with known risks for hepatitis C.  Practice safe sex. Use condoms and avoid high-risk sexual practices to reduce the spread of sexually transmitted infections (STIs). STIs include gonorrhea, chlamydia, syphilis, trichomonas, herpes, HPV, and human immunodeficiency virus (HIV). Herpes, HIV, and HPV are viral illnesses that have no cure. They can result in disability, cancer, and death.  You should be screened for sexually transmitted illnesses (STIs) including gonorrhea and chlamydia if:  You are sexually active and are younger than 24 years.  You are older than 24 years and your health care provider tells you that you are at risk for this type of infection.  Your sexual activity has changed since you were last screened and you are at an increased risk for chlamydia or gonorrhea. Ask your health care provider if you are at risk.  If you are at risk of being infected with HIV, it is recommended that you take a prescription medicine daily to prevent HIV infection. This is  called preexposure prophylaxis (PrEP). You are considered at risk if:  You are a heterosexual woman, are sexually active, and are at increased risk for HIV infection.  You take drugs by injection.  You are sexually active with a partner who has HIV.  Talk with your health care provider about whether you are at high risk of being infected with HIV. If you choose to begin PrEP, you should first be tested for HIV. You should then be tested every 3 months for as long as you are taking PrEP.  Osteoporosis is a disease in which the bones lose minerals and strength   with aging. This can result in serious bone fractures or breaks. The risk of osteoporosis can be identified using a bone density scan. Women ages 65 years and over and women at risk for fractures or osteoporosis should discuss screening with their health care providers. Ask your health care provider whether you should take a calcium supplement or vitamin D to reduce the rate of osteoporosis.  Menopause can be associated with physical symptoms and risks. Hormone replacement therapy is available to decrease symptoms and risks. You should talk to your health care provider about whether hormone replacement therapy is right for you.  Use sunscreen. Apply sunscreen liberally and repeatedly throughout the day. You should seek shade when your shadow is shorter than you. Protect yourself by wearing long sleeves, pants, a wide-brimmed hat, and sunglasses year round, whenever you are outdoors.  Once a month, do a whole body skin exam, using a mirror to look at the skin on your back. Tell your health care provider of new moles, moles that have irregular borders, moles that are larger than a pencil eraser, or moles that have changed in shape or color.  Stay current with required vaccines (immunizations).  Influenza vaccine. All adults should be immunized every year.  Tetanus, diphtheria, and acellular pertussis (Td, Tdap) vaccine. Pregnant women should  receive 1 dose of Tdap vaccine during each pregnancy. The dose should be obtained regardless of the length of time since the last dose. Immunization is preferred during the 27th-36th week of gestation. An adult who has not previously received Tdap or who does not know her vaccine status should receive 1 dose of Tdap. This initial dose should be followed by tetanus and diphtheria toxoids (Td) booster doses every 10 years. Adults with an unknown or incomplete history of completing a 3-dose immunization series with Td-containing vaccines should begin or complete a primary immunization series including a Tdap dose. Adults should receive a Td booster every 10 years.  Varicella vaccine. An adult without evidence of immunity to varicella should receive 2 doses or a second dose if she has previously received 1 dose. Pregnant females who do not have evidence of immunity should receive the first dose after pregnancy. This first dose should be obtained before leaving the health care facility. The second dose should be obtained 4-8 weeks after the first dose.  Human papillomavirus (HPV) vaccine. Females aged 13-26 years who have not received the vaccine previously should obtain the 3-dose series. The vaccine is not recommended for use in pregnant females. However, pregnancy testing is not needed before receiving a dose. If a female is found to be pregnant after receiving a dose, no treatment is needed. In that case, the remaining doses should be delayed until after the pregnancy. Immunization is recommended for any person with an immunocompromised condition through the age of 26 years if she did not get any or all doses earlier. During the 3-dose series, the second dose should be obtained 4-8 weeks after the first dose. The third dose should be obtained 24 weeks after the first dose and 16 weeks after the second dose.  Zoster vaccine. One dose is recommended for adults aged 60 years or older unless certain conditions are  present.  Measles, mumps, and rubella (MMR) vaccine. Adults born before 1957 generally are considered immune to measles and mumps. Adults born in 1957 or later should have 1 or more doses of MMR vaccine unless there is a contraindication to the vaccine or there is laboratory evidence of immunity to   each of the three diseases. A routine second dose of MMR vaccine should be obtained at least 28 days after the first dose for students attending postsecondary schools, health care workers, or international travelers. People who received inactivated measles vaccine or an unknown type of measles vaccine during 1963-1967 should receive 2 doses of MMR vaccine. People who received inactivated mumps vaccine or an unknown type of mumps vaccine before 1979 and are at high risk for mumps infection should consider immunization with 2 doses of MMR vaccine. For females of childbearing age, rubella immunity should be determined. If there is no evidence of immunity, females who are not pregnant should be vaccinated. If there is no evidence of immunity, females who are pregnant should delay immunization until after pregnancy. Unvaccinated health care workers born before 1957 who lack laboratory evidence of measles, mumps, or rubella immunity or laboratory confirmation of disease should consider measles and mumps immunization with 2 doses of MMR vaccine or rubella immunization with 1 dose of MMR vaccine.  Pneumococcal 13-valent conjugate (PCV13) vaccine. When indicated, a person who is uncertain of her immunization history and has no record of immunization should receive the PCV13 vaccine. An adult aged 19 years or older who has certain medical conditions and has not been previously immunized should receive 1 dose of PCV13 vaccine. This PCV13 should be followed with a dose of pneumococcal polysaccharide (PPSV23) vaccine. The PPSV23 vaccine dose should be obtained at least 8 weeks after the dose of PCV13 vaccine. An adult aged 19  years or older who has certain medical conditions and previously received 1 or more doses of PPSV23 vaccine should receive 1 dose of PCV13. The PCV13 vaccine dose should be obtained 1 or more years after the last PPSV23 vaccine dose.  Pneumococcal polysaccharide (PPSV23) vaccine. When PCV13 is also indicated, PCV13 should be obtained first. All adults aged 65 years and older should be immunized. An adult younger than age 65 years who has certain medical conditions should be immunized. Any person who resides in a nursing home or long-term care facility should be immunized. An adult smoker should be immunized. People with an immunocompromised condition and certain other conditions should receive both PCV13 and PPSV23 vaccines. People with human immunodeficiency virus (HIV) infection should be immunized as soon as possible after diagnosis. Immunization during chemotherapy or radiation therapy should be avoided. Routine use of PPSV23 vaccine is not recommended for American Indians, [place] Natives, or people younger than 65 years unless there are medical conditions that require PPSV23 vaccine. When indicated, people who have unknown immunization and have no record of immunization should receive PPSV23 vaccine. One-time revaccination 5 years after the first dose of PPSV23 is recommended for people aged 19-64 years who have chronic kidney failure, nephrotic syndrome, asplenia, or immunocompromised conditions. People who received 1-2 doses of PPSV23 before age 65 years should receive another dose of PPSV23 vaccine at age 65 years or later if at least 5 years have passed since the previous dose. Doses of PPSV23 are not needed for people immunized with PPSV23 at or after age 65 years.  Meningococcal vaccine. Adults with asplenia or persistent complement component deficiencies should receive 2 doses of quadrivalent meningococcal conjugate (MenACWY-D) vaccine. The doses should be obtained at least 2 months apart.  Microbiologists working with certain meningococcal bacteria, military recruits, people at risk during an outbreak, and people who travel to or live in countries with a high rate of meningitis should be immunized. A first-year college student up through age   21 years who is living in a residence hall should receive a dose if she did not receive a dose on or after her 16th birthday. Adults who have certain high-risk conditions should receive one or more doses of vaccine.  Hepatitis A vaccine. Adults who wish to be protected from this disease, have certain high-risk conditions, work with hepatitis A-infected animals, work in hepatitis A research labs, or travel to or work in countries with a high rate of hepatitis A should be immunized. Adults who were previously unvaccinated and who anticipate close contact with an international adoptee during the first 60 days after arrival in the [place] from a country with a high rate of hepatitis A should be immunized.  Hepatitis B vaccine. Adults who wish to be protected from this disease, have certain high-risk conditions, may be exposed to blood or other infectious body fluids, are household contacts or sex partners of hepatitis B positive people, are clients or workers in certain care facilities, or travel to or work in countries with a high rate of hepatitis B should be immunized.  Haemophilus influenzae type b (Hib) vaccine. A previously unvaccinated person with asplenia or sickle cell disease or having a scheduled splenectomy should receive 1 dose of Hib vaccine. Regardless of previous immunization, a recipient of a hematopoietic stem cell transplant should receive a 3-dose series 6-12 months after her successful transplant. Hib vaccine is not recommended for adults with HIV infection. Preventive Services / Frequency Ages 64 to 68 years  Blood pressure check.** / Every 1 to 2 years.  Lipid and cholesterol check.** / Every 5 years beginning at age  22.  Clinical breast exam.** / Every 3 years for women in their 88s and 53s.  BRCA-related cancer risk assessment.** / For women who have family members with a BRCA-related cancer (breast, ovarian, tubal, or peritoneal cancers).  Pap test.** / Every 2 years from ages 90 through 51. Every 3 years starting at age 21 through age 56 or 3 with a history of 3 consecutive normal Pap tests.  HPV screening.** / Every 3 years from ages 24 through ages 1 to 46 with a history of 3 consecutive normal Pap tests.  Hepatitis C blood test.** / For any individual with known risks for hepatitis C.  Skin self-exam. / Monthly.  Influenza vaccine. / Every year.  Tetanus, diphtheria, and acellular pertussis (Tdap, Td) vaccine.** / Consult your health care provider. Pregnant women should receive 1 dose of Tdap vaccine during each pregnancy. 1 dose of Td every 10 years.  Varicella vaccine.** / Consult your health care provider. Pregnant females who do not have evidence of immunity should receive the first dose after pregnancy.  HPV vaccine. / 3 doses over 6 months, if 72 and younger. The vaccine is not recommended for use in pregnant females. However, pregnancy testing is not needed before receiving a dose.  Measles, mumps, rubella (MMR) vaccine.** / You need at least 1 dose of MMR if you were born in 1957 or later. You may also need a 2nd dose. For females of childbearing age, rubella immunity should be determined. If there is no evidence of immunity, females who are not pregnant should be vaccinated. If there is no evidence of immunity, females who are pregnant should delay immunization until after pregnancy.  Pneumococcal 13-valent conjugate (PCV13) vaccine.** / Consult your health care provider.  Pneumococcal polysaccharide (PPSV23) vaccine.** / 1 to 2 doses if you smoke cigarettes or if you have certain conditions.  Meningococcal vaccine.** /  1 dose if you are age 19 to 21 years and a first-year college  student living in a residence hall, or have one of several medical conditions, you need to get vaccinated against meningococcal disease. You may also need additional booster doses.  Hepatitis A vaccine.** / Consult your health care provider.  Hepatitis B vaccine.** / Consult your health care provider.  Haemophilus influenzae type b (Hib) vaccine.** / Consult your health care provider. Ages 40 to 64 years  Blood pressure check.** / Every 1 to 2 years.  Lipid and cholesterol check.** / Every 5 years beginning at age 20 years.  Lung cancer screening. / Every year if you are aged 55-80 years and have a 30-pack-year history of smoking and currently smoke or have quit within the past 15 years. Yearly screening is stopped once you have quit smoking for at least 15 years or develop a health problem that would prevent you from having lung cancer treatment.  Clinical breast exam.** / Every year after age 40 years.  BRCA-related cancer risk assessment.** / For women who have family members with a BRCA-related cancer (breast, ovarian, tubal, or peritoneal cancers).  Mammogram.** / Every year beginning at age 40 years and continuing for as long as you are in good health. Consult with your health care provider.  Pap test.** / Every 3 years starting at age 30 years through age 65 or 70 years with a history of 3 consecutive normal Pap tests.  HPV screening.** / Every 3 years from ages 30 years through ages 65 to 70 years with a history of 3 consecutive normal Pap tests.  Fecal occult blood test (FOBT) of stool. / Every year beginning at age 50 years and continuing until age 75 years. You may not need to do this test if you get a colonoscopy every 10 years.  Flexible sigmoidoscopy or colonoscopy.** / Every 5 years for a flexible sigmoidoscopy or every 10 years for a colonoscopy beginning at age 50 years and continuing until age 75 years.  Hepatitis C blood test.** / For all people born from 1945 through  1965 and any individual with known risks for hepatitis C.  Skin self-exam. / Monthly.  Influenza vaccine. / Every year.  Tetanus, diphtheria, and acellular pertussis (Tdap/Td) vaccine.** / Consult your health care provider. Pregnant women should receive 1 dose of Tdap vaccine during each pregnancy. 1 dose of Td every 10 years.  Varicella vaccine.** / Consult your health care provider. Pregnant females who do not have evidence of immunity should receive the first dose after pregnancy.  Zoster vaccine.** / 1 dose for adults aged 60 years or older.  Measles, mumps, rubella (MMR) vaccine.** / You need at least 1 dose of MMR if you were born in 1957 or later. You may also need a 2nd dose. For females of childbearing age, rubella immunity should be determined. If there is no evidence of immunity, females who are not pregnant should be vaccinated. If there is no evidence of immunity, females who are pregnant should delay immunization until after pregnancy.  Pneumococcal 13-valent conjugate (PCV13) vaccine.** / Consult your health care provider.  Pneumococcal polysaccharide (PPSV23) vaccine.** / 1 to 2 doses if you smoke cigarettes or if you have certain conditions.  Meningococcal vaccine.** / Consult your health care provider.  Hepatitis A vaccine.** / Consult your health care provider.  Hepatitis B vaccine.** / Consult your health care provider.  Haemophilus influenzae type b (Hib) vaccine.** / Consult your health care provider. Ages 65   years and over  Blood pressure check.** / Every 1 to 2 years.  Lipid and cholesterol check.** / Every 5 years beginning at age 20 years.  Lung cancer screening. / Every year if you are aged 55-80 years and have a 30-pack-year history of smoking and currently smoke or have quit within the past 15 years. Yearly screening is stopped once you have quit smoking for at least 15 years or develop a health problem that would prevent you from having lung cancer  treatment.  Clinical breast exam.** / Every year after age 40 years.  BRCA-related cancer risk assessment.** / For women who have family members with a BRCA-related cancer (breast, ovarian, tubal, or peritoneal cancers).  Mammogram.** / Every year beginning at age 40 years and continuing for as long as you are in good health. Consult with your health care provider.  Pap test.** / Every 3 years starting at age 30 years through age 65 or 70 years with 3 consecutive normal Pap tests. Testing can be stopped between 65 and 70 years with 3 consecutive normal Pap tests and no abnormal Pap or HPV tests in the past 10 years.  HPV screening.** / Every 3 years from ages 30 years through ages 65 or 70 years with a history of 3 consecutive normal Pap tests. Testing can be stopped between 65 and 70 years with 3 consecutive normal Pap tests and no abnormal Pap or HPV tests in the past 10 years.  Fecal occult blood test (FOBT) of stool. / Every year beginning at age 50 years and continuing until age 75 years. You may not need to do this test if you get a colonoscopy every 10 years.  Flexible sigmoidoscopy or colonoscopy.** / Every 5 years for a flexible sigmoidoscopy or every 10 years for a colonoscopy beginning at age 50 years and continuing until age 75 years.  Hepatitis C blood test.** / For all people born from 1945 through 1965 and any individual with known risks for hepatitis C.  Osteoporosis screening.** / A one-time screening for women ages 65 years and over and women at risk for fractures or osteoporosis.  Skin self-exam. / Monthly.  Influenza vaccine. / Every year.  Tetanus, diphtheria, and acellular pertussis (Tdap/Td) vaccine.** / 1 dose of Td every 10 years.  Varicella vaccine.** / Consult your health care provider.  Zoster vaccine.** / 1 dose for adults aged 60 years or older.  Pneumococcal 13-valent conjugate (PCV13) vaccine.** / Consult your health care provider.  Pneumococcal  polysaccharide (PPSV23) vaccine.** / 1 dose for all adults aged 65 years and older.  Meningococcal vaccine.** / Consult your health care provider.  Hepatitis A vaccine.** / Consult your health care provider.  Hepatitis B vaccine.** / Consult your health care provider.  Haemophilus influenzae type b (Hib) vaccine.** / Consult your health care provider.   Health Maintenance Adopting a healthy lifestyle and getting preventive care can go a long way to promote health and wellness. Talk with your health care provider about what schedule of regular examinations is right for you. This is a good chance for you to check in with your provider about disease prevention and staying healthy. In between checkups, there are plenty of things you can do on your own. Experts have done a lot of research about which lifestyle changes and preventive measures are most likely to keep you healthy. Ask your health care provider for more information. WEIGHT AND DIET  Eat a healthy diet Be sure to include plenty of vegetables,   fruits, low-fat dairy products, and lean protein. Do not eat a lot of foods high in solid fats, added sugars, or salt. Get regular exercise. This is one of the most important things you can do for your health. Most adults should exercise for at least 150 minutes each week. The exercise should increase your heart rate and make you sweat (moderate-intensity exercise). Most adults should also do strengthening exercises at least twice a week. This is in addition to the moderate-intensity exercise.  Maintain a healthy weight Body mass index (BMI) is a measurement that can be used to identify possible weight problems. It estimates body fat based on height and weight. Your health care provider can help determine your BMI and help you achieve or maintain a healthy weight. For females 20 years of age and older:  A BMI below 18.5 is considered underweight. A BMI of 18.5 to 24.9 is normal. A BMI of 25 to  29.9 is considered overweight. A BMI of 30 and above is considered obese.  Watch levels of cholesterol and blood lipids You should start having your blood tested for lipids and cholesterol at 78 years of age, then have this test every 5 years. You may need to have your cholesterol levels checked more often if: Your lipid or cholesterol levels are high. You are older than 78 years of age. You are at high risk for heart disease.  CANCER SCREENING   Lung Cancer Lung cancer screening is recommended for adults 55-80 years old who are at high risk for lung cancer because of a history of smoking. A yearly low-dose CT scan of the lungs is recommended for people who: Currently smoke. Have quit within the past 15 years. Have at least a 30-pack-year history of smoking. A pack year is smoking an average of one pack of cigarettes a day for 1 year. Yearly screening should continue until it has been 15 years since you quit. Yearly screening should stop if you develop a health problem that would prevent you from having lung cancer treatment.  Breast Cancer Practice breast self-awareness. This means understanding how your breasts normally appear and feel. It also means doing regular breast self-exams. Let your health care provider know about any changes, no matter how small. If you are in your 20s or 30s, you should have a clinical breast exam (CBE) by a health care provider every 1-3 years as part of a regular health exam. If you are 40 or older, have a CBE every year. Also consider having a breast X-ray (mammogram) every year. If you have a family history of breast cancer, talk to your health care provider about genetic screening. If you are at high risk for breast cancer, talk to your health care provider about having an MRI and a mammogram every year. Breast cancer gene (BRCA) assessment is recommended for women who have family members with BRCA-related cancers. BRCA-related cancers  include: Breast. Ovarian. Tubal. Peritoneal cancers. Results of the assessment will determine the need for genetic counseling and BRCA1 and BRCA2 testing. Cervical Cancer Routine pelvic examinations to screen for cervical cancer are no longer recommended for nonpregnant women who are considered low risk for cancer of the pelvic organs (ovaries, uterus, and vagina) and who do not have symptoms. A pelvic examination may be necessary if you have symptoms including those associated with pelvic infections. Ask your health care provider if a screening pelvic exam is right for you.  The Pap test is the screening test for cervical cancer for   women who are considered at risk. If you had a hysterectomy for a problem that was not cancer or a condition that could lead to cancer, then you no longer need Pap tests. If you are older than 65 years, and you have had normal Pap tests for the past 10 years, you no longer need to have Pap tests. If you have had past treatment for cervical cancer or a condition that could lead to cancer, you need Pap tests and screening for cancer for at least 20 years after your treatment. If you no longer get a Pap test, assess your risk factors if they change (such as having a new sexual partner). This can affect whether you should start being screened again. Some women have medical problems that increase their chance of getting cervical cancer. If this is the case for you, your health care provider may recommend more frequent screening and Pap tests. The human papillomavirus (HPV) test is another test that may be used for cervical cancer screening. The HPV test looks for the virus that can cause cell changes in the cervix. The cells collected during the Pap test can be tested for HPV. The HPV test can be used to screen women 30 years of age and older. Getting tested for HPV can extend the interval between normal Pap tests from three to five years. An HPV test also should be used to  screen women of any age who have unclear Pap test results. After 78 years of age, women should have HPV testing as often as Pap tests.  Colorectal Cancer This type of cancer can be detected and often prevented. Routine colorectal cancer screening usually begins at 78 years of age and continues through 78 years of age. Your health care provider may recommend screening at an earlier age if you have risk factors for colon cancer. Your health care provider may also recommend using home test kits to check for hidden blood in the stool. A small camera at the end of a tube can be used to examine your colon directly (sigmoidoscopy or colonoscopy). This is done to check for the earliest forms of colorectal cancer. Routine screening usually begins at age 50. Direct examination of the colon should be repeated every 5-10 years through 78 years of age. However, you may need to be screened more often if early forms of precancerous polyps or small growths are found. Skin Cancer Check your skin from head to toe regularly. Tell your health care provider about any new moles or changes in moles, especially if there is a change in a mole's shape or color. Also tell your health care provider if you have a mole that is larger than the size of a pencil eraser. Always use sunscreen. Apply sunscreen liberally and repeatedly throughout the day. Protect yourself by wearing long sleeves, pants, a wide-brimmed hat, and sunglasses whenever you are outside. HEART DISEASE, DIABETES, AND HIGH BLOOD PRESSURE  Have your blood pressure checked at least every 1-2 years. High blood pressure causes heart disease and increases the risk of stroke. If you are between 55 years and 79 years old, ask your health care provider if you should take aspirin to prevent strokes. Have regular diabetes screenings. This involves taking a blood sample to check your fasting blood sugar level. If you are at a normal weight and have a low risk for  diabetes, have this test once every three years after 78 years of age. If you are overweight and have a high risk   for diabetes, consider being tested at a younger age or more often. PREVENTING INFECTION  Hepatitis B If you have a higher risk for hepatitis B, you should be screened for this virus. You are considered at high risk for hepatitis B if: You were born in a country where hepatitis B is common. Ask your health care provider which countries are considered high risk. Your parents were born in a high-risk country, and you have not been immunized against hepatitis B (hepatitis B vaccine). You have HIV or AIDS. You use needles to inject street drugs. You live with someone who has hepatitis B. You have had sex with someone who has hepatitis B. You get hemodialysis treatment. You take certain medicines for conditions, including cancer, organ transplantation, and autoimmune conditions. Hepatitis C Blood testing is recommended for: Everyone born from 1945 through 1965. Anyone with known risk factors for hepatitis C. Sexually transmitted infections (STIs) You should be screened for sexually transmitted infections (STIs) including gonorrhea and chlamydia if: You are sexually active and are younger than 78 years of age. You are older than 78 years of age and your health care provider tells you that you are at risk for this type of infection. Your sexual activity has changed since you were last screened and you are at an increased risk for chlamydia or gonorrhea. Ask your health care provider if you are at risk. If you do not have HIV, but are at risk, it may be recommended that you take a prescription medicine daily to prevent HIV infection. This is called pre-exposure prophylaxis (PrEP). You are considered at risk if: You are sexually active and do not regularly use condoms or know the HIV status of your partner(s). You take drugs by injection. You are sexually active with a partner who has  HIV. Talk with your health care provider about whether you are at high risk of being infected with HIV. If you choose to begin PrEP, you should first be tested for HIV. You should then be tested every 3 months for as long as you are taking PrEP.  PREGNANCY  If you are premenopausal and you may become pregnant, ask your health care provider about preconception counseling. If you may become pregnant, take 400 to 800 micrograms (mcg) of folic acid every day. If you want to prevent pregnancy, talk to your health care provider about birth control (contraception). OSTEOPOROSIS AND MENOPAUSE  Osteoporosis is a disease in which the bones lose minerals and strength with aging. This can result in serious bone fractures. Your risk for osteoporosis can be identified using a bone density scan. If you are 65 years of age or older, or if you are at risk for osteoporosis and fractures, ask your health care provider if you should be screened. Ask your health care provider whether you should take a calcium or vitamin D supplement to lower your risk for osteoporosis. Menopause may have certain physical symptoms and risks. Hormone replacement therapy may reduce some of these symptoms and risks. Talk to your health care provider about whether hormone replacement therapy is right for you.  HOME CARE INSTRUCTIONS  Schedule regular health, dental, and eye exams. Stay current with your immunizations.  Do not use any tobacco products including cigarettes, chewing tobacco, or electronic cigarettes. If you are pregnant, do not drink alcohol. If you are breastfeeding, limit how much and how often you drink alcohol. Limit alcohol intake to no more than 1 drink per day for nonpregnant women. One drink equals 12   ounces of beer, 5 ounces of wine, or 1 ounces of hard liquor. Do not use street drugs. Do not share needles. Ask your health care provider for help if you need support or information about quitting drugs. Tell your  health care provider if you often feel depressed. Tell your health care provider if you have ever been abused or do not feel safe at home  

## 2014-06-27 LAB — LIPID PANEL
Cholesterol: 161 mg/dL (ref 0–200)
HDL: 83 mg/dL (ref >39)
LDL Cholesterol: 61 mg/dL (ref 0–99)
TRIGLYCERIDES: 86 mg/dL (ref <150)
Total CHOL/HDL Ratio: 1.9 Ratio
VLDL: 17 mg/dL (ref 0–40)

## 2014-06-27 LAB — BASIC METABOLIC PANEL WITH GFR
BUN: 26 mg/dL — ABNORMAL HIGH (ref 6–23)
CO2: 27 mEq/L (ref 19–32)
Calcium: 9.2 mg/dL (ref 8.4–10.5)
Chloride: 102 mEq/L (ref 96–112)
Creat: 1.38 mg/dL — ABNORMAL HIGH (ref 0.50–1.10)
GFR, EST NON AFRICAN AMERICAN: 35 mL/min — AB
GFR, Est African American: 40 mL/min — ABNORMAL LOW
GLUCOSE: 92 mg/dL (ref 70–99)
POTASSIUM: 4.2 meq/L (ref 3.5–5.3)
Sodium: 137 mEq/L (ref 135–145)

## 2014-06-27 LAB — MICROALBUMIN / CREATININE URINE RATIO
Creatinine, Urine: 99.9 mg/dL
Microalb Creat Ratio: 197.7 mg/g — ABNORMAL HIGH (ref 0.0–30.0)
Microalb, Ur: 19.75 mg/dL — ABNORMAL HIGH (ref 0.00–1.89)

## 2014-06-27 LAB — INSULIN, FASTING: INSULIN FASTING, SERUM: 12 u[IU]/mL (ref 3–28)

## 2014-06-27 LAB — URINALYSIS, MICROSCOPIC ONLY
CASTS: NONE SEEN
CRYSTALS: NONE SEEN

## 2014-06-27 LAB — MAGNESIUM: Magnesium: 1.9 mg/dL (ref 1.5–2.5)

## 2014-06-27 LAB — HEPATIC FUNCTION PANEL
ALK PHOS: 46 U/L (ref 39–117)
ALT: 8 U/L (ref 0–35)
AST: 18 U/L (ref 0–37)
Albumin: 4 g/dL (ref 3.5–5.2)
Bilirubin, Direct: <0.1 mg/dL (ref 0.0–0.3)
TOTAL PROTEIN: 7.1 g/dL (ref 6.0–8.3)
Total Bilirubin: 0.3 mg/dL (ref 0.2–1.2)

## 2014-06-27 LAB — TSH: TSH: 2.93 u[IU]/mL (ref 0.350–4.500)

## 2014-06-29 ENCOUNTER — Other Ambulatory Visit: Payer: Self-pay | Admitting: Internal Medicine

## 2014-06-29 LAB — URINE CULTURE: Colony Count: >=100000

## 2014-06-30 ENCOUNTER — Other Ambulatory Visit: Payer: Self-pay | Admitting: *Deleted

## 2014-06-30 MED ORDER — CIPROFLOXACIN HCL 250 MG PO TABS: 250.0000 mg | ORAL_TABLET | Freq: Two times a day (BID) | ORAL | Status: AC

## 2014-07-16 ENCOUNTER — Other Ambulatory Visit: Payer: Self-pay | Admitting: *Deleted

## 2014-07-16 DIAGNOSIS — Z1212 Encounter for screening for malignant neoplasm of rectum: Secondary | ICD-10-CM

## 2014-07-16 LAB — POC HEMOCCULT BLD/STL (HOME/3-CARD/SCREEN)
Card #2 Fecal Occult Blod, POC: NEGATIVE
Card #3 Fecal Occult Blood, POC: NEGATIVE
Fecal Occult Blood, POC: NEGATIVE

## 2014-07-28 ENCOUNTER — Ambulatory Visit: Payer: Self-pay

## 2014-08-05 ENCOUNTER — Ambulatory Visit (INDEPENDENT_AMBULATORY_CARE_PROVIDER_SITE_OTHER): Payer: Medicare Other | Admitting: *Deleted

## 2014-08-05 DIAGNOSIS — N3000 Acute cystitis without hematuria: Secondary | ICD-10-CM

## 2014-08-05 NOTE — Progress Notes (Deleted)
Patient ID: Brooke Conley, female   DOB: [birth year], 80 y.o.   MRN: [id number]

## 2014-08-06 LAB — URINALYSIS, ROUTINE W REFLEX MICROSCOPIC
BILIRUBIN URINE: NEGATIVE
Glucose, UA: NEGATIVE mg/dL
Hgb urine dipstick: NEGATIVE
Ketones, ur: NEGATIVE mg/dL
NITRITE: POSITIVE — AB
Protein, ur: NEGATIVE mg/dL
SPECIFIC GRAVITY, URINE: 1.012 (ref 1.005–1.030)
UROBILINOGEN UA: 0.2 mg/dL (ref 0.0–1.0)
pH: 6.5 (ref 5.0–8.0)

## 2014-08-06 LAB — URINALYSIS, MICROSCOPIC ONLY
CRYSTALS: NONE SEEN
Casts: NONE SEEN

## 2014-08-07 ENCOUNTER — Other Ambulatory Visit: Payer: Self-pay | Admitting: Internal Medicine

## 2014-08-08 ENCOUNTER — Other Ambulatory Visit: Payer: Self-pay | Admitting: Physician Assistant

## 2014-08-08 LAB — URINE CULTURE: Colony Count: >=100000

## 2014-08-08 MED ORDER — CIPROFLOXACIN HCL 250 MG PO TABS: 250.0000 mg | ORAL_TABLET | Freq: Two times a day (BID) | ORAL | Status: AC

## 2014-09-11 ENCOUNTER — Ambulatory Visit (INDEPENDENT_AMBULATORY_CARE_PROVIDER_SITE_OTHER): Payer: Medicare Other | Admitting: *Deleted

## 2014-09-11 DIAGNOSIS — N3000 Acute cystitis without hematuria: Secondary | ICD-10-CM

## 2014-09-11 NOTE — Progress Notes (Signed)
Patient ID: Brooke Conley, female   DOB: Mar 17, 1927, 80 y.o.   MRN: [id number] Patient presents for 1 month recheck UA, C&S.

## 2014-09-12 LAB — URINE CULTURE: Colony Count: 30000

## 2014-09-12 LAB — URINALYSIS, ROUTINE W REFLEX MICROSCOPIC
Bilirubin Urine: NEGATIVE
Glucose, UA: NEGATIVE mg/dL
HGB URINE DIPSTICK: NEGATIVE
KETONES UR: NEGATIVE mg/dL
Leukocytes, UA: NEGATIVE
Nitrite: NEGATIVE
Protein, ur: NEGATIVE mg/dL
Specific Gravity, Urine: 1.013 (ref 1.005–1.030)
Urobilinogen, UA: 0.2 mg/dL (ref 0.0–1.0)
pH: 6.5 (ref 5.0–8.0)

## 2014-09-15 ENCOUNTER — Other Ambulatory Visit: Payer: Self-pay | Admitting: Internal Medicine

## 2014-09-15 ENCOUNTER — Other Ambulatory Visit: Payer: Self-pay | Admitting: Emergency Medicine

## 2014-10-07 ENCOUNTER — Ambulatory Visit: Payer: Self-pay | Admitting: Physician Assistant

## 2015-01-06 ENCOUNTER — Other Ambulatory Visit: Payer: Self-pay | Admitting: Internal Medicine

## 2015-01-14 ENCOUNTER — Ambulatory Visit: Payer: Self-pay | Admitting: Internal Medicine

## 2015-05-13 ENCOUNTER — Other Ambulatory Visit: Payer: Self-pay | Admitting: Internal Medicine

## 2015-05-20 DIAGNOSIS — Z961 Presence of intraocular lens: Secondary | ICD-10-CM | POA: Diagnosis not present

## 2015-05-20 DIAGNOSIS — H2512 Age-related nuclear cataract, left eye: Secondary | ICD-10-CM | POA: Diagnosis not present

## 2015-05-20 DIAGNOSIS — H524 Presbyopia: Secondary | ICD-10-CM | POA: Diagnosis not present

## 2015-05-20 DIAGNOSIS — H3531 Nonexudative age-related macular degeneration: Secondary | ICD-10-CM | POA: Diagnosis not present

## 2015-07-06 ENCOUNTER — Encounter: Payer: Self-pay | Admitting: Internal Medicine

## 2015-07-06 ENCOUNTER — Ambulatory Visit (INDEPENDENT_AMBULATORY_CARE_PROVIDER_SITE_OTHER): Payer: Medicare Other | Admitting: Internal Medicine

## 2015-07-06 VITALS — BP 124/78 | HR 64 | Temp 97.3°F | Resp 16 | Ht 62.75 in | Wt 94.8 lb

## 2015-07-06 DIAGNOSIS — Z1212 Encounter for screening for malignant neoplasm of rectum: Secondary | ICD-10-CM

## 2015-07-06 DIAGNOSIS — E785 Hyperlipidemia, unspecified: Secondary | ICD-10-CM

## 2015-07-06 DIAGNOSIS — Z1331 Encounter for screening for depression: Secondary | ICD-10-CM

## 2015-07-06 DIAGNOSIS — Z23 Encounter for immunization: Secondary | ICD-10-CM

## 2015-07-06 DIAGNOSIS — R7309 Other abnormal glucose: Secondary | ICD-10-CM | POA: Diagnosis not present

## 2015-07-06 DIAGNOSIS — Z79899 Other long term (current) drug therapy: Secondary | ICD-10-CM

## 2015-07-06 DIAGNOSIS — E039 Hypothyroidism, unspecified: Secondary | ICD-10-CM

## 2015-07-06 DIAGNOSIS — I10 Essential (primary) hypertension: Secondary | ICD-10-CM

## 2015-07-06 DIAGNOSIS — Z853 Personal history of malignant neoplasm of breast: Secondary | ICD-10-CM

## 2015-07-06 DIAGNOSIS — Z0000 Encounter for general adult medical examination without abnormal findings: Secondary | ICD-10-CM | POA: Diagnosis not present

## 2015-07-06 DIAGNOSIS — E559 Vitamin D deficiency, unspecified: Secondary | ICD-10-CM | POA: Diagnosis not present

## 2015-07-06 DIAGNOSIS — M810 Age-related osteoporosis without current pathological fracture: Secondary | ICD-10-CM

## 2015-07-06 DIAGNOSIS — Z9181 History of falling: Secondary | ICD-10-CM

## 2015-07-06 DIAGNOSIS — Z681 Body mass index (BMI) 19 or less, adult: Secondary | ICD-10-CM

## 2015-07-06 DIAGNOSIS — Z8542 Personal history of malignant neoplasm of other parts of uterus: Secondary | ICD-10-CM

## 2015-07-06 DIAGNOSIS — R7303 Prediabetes: Secondary | ICD-10-CM

## 2015-07-06 NOTE — Patient Instructions (Signed)
Recommend Adult Low dose Aspirin or coated  Aspirin 81 mg daily   To reduce risk of Colon Cancer 20 %,   Skin Cancer 26 % ,   Melanoma 46%   and   Pancreatic cancer 60% ++++++++++++++++++ Vitamin D goal is between 70-100.   Please make sure that you are taking your Vitamin D as directed.   It is very important as a natural anti-inflammatory   helping hair, skin, and nails, as well as reducing stroke and heart attack risk.   It helps your bones and helps with mood.  It also decreases numerous cancer risks so please take it as directed.   Low Vit D is associated with a 200-300% higher risk for CANCER   and 200-300% higher risk for HEART   ATTACK  &  STROKE.   ......................................  It is also associated with higher death rate at younger ages,   autoimmune diseases like Rheumatoid arthritis, Lupus, Multiple Sclerosis.     Also many other serious conditions, like depression, Alzheimer's  Dementia, infertility, muscle aches, fatigue, fibromyalgia - just to name a few.  +++++++++++++++++++  Recommend the book "The END of DIETING" by Dr Joel Fuhrman   & the book "The END of DIABETES " by Dr Joel Fuhrman  At [employer] - get book & Audio CD's     Being diabetic has a  300% increased risk for heart attack, stroke, cancer, and alzheimer- type vascular dementia. It is very important that you work harder with diet by avoiding all foods that are white. Avoid white rice (brown & wild rice is OK), white potatoes (sweetpotatoes in moderation is OK), White bread or wheat bread or anything made out of white flour like bagels, donuts, rolls, buns, biscuits, cakes, pastries, cookies, pizza crust, and pasta (made from white flour & egg whites) - vegetarian pasta or spinach or wheat pasta is OK. Multigrain breads like Arnold's or [employer], or multigrain sandwich thins or flatbreads.  Diet, exercise and weight loss can reverse and cure diabetes in the early stages.   Diet, exercise and weight loss is very important in the control and prevention of complications of diabetes which affects every system in your body, ie. Brain - dementia/stroke, eyes - glaucoma/blindness, heart - heart attack/heart failure, kidneys - dialysis, stomach - gastric paralysis, intestines - malabsorption, nerves - severe painful neuritis, circulation - gangrene & loss of a leg(s), and finally cancer and Alzheimers.    I recommend avoid fried & greasy foods,  sweets/candy, white rice (brown or wild rice or Quinoa is OK), white potatoes (sweet potatoes are OK) - anything made from white flour - bagels, doughnuts, rolls, buns, biscuits,white and wheat breads, pizza crust and traditional pasta made of white flour & egg white(vegetarian pasta or spinach or wheat pasta is OK).  Multi-grain bread is OK - like multi-grain flat bread or sandwich thins. Avoid alcohol in excess. Exercise is also important.    Eat all the vegetables you want - avoid meat, especially red meat and dairy - especially cheese.  Cheese is the most concentrated form of trans-fats which is the worst thing to clog up our arteries. Veggie cheese is OK which can be found in the fresh produce section at [employer] or [employer] or [employer]  ++++++++++++++++++++++++++   Preventive Care for Adults  A healthy lifestyle and preventive care can promote health and wellness. Preventive health guidelines for women include the following key practices.  A routine yearly physical is a good way   to check with your health care provider about your health and preventive screening. It is a chance to share any concerns and updates on your health and to receive a thorough exam.  Visit your dentist for a routine exam and preventive care every 6 months. Brush your teeth twice a day and floss once a day. Good oral hygiene prevents tooth decay and gum disease.  The frequency of eye exams is based on your age, health, family medical history, use  of contact lenses, and other factors. Follow your health care provider's recommendations for frequency of eye exams.  Eat a healthy diet. Foods like vegetables, fruits, whole grains, low-fat dairy products, and lean protein foods contain the nutrients you need without too many calories. Decrease your intake of foods high in solid fats, added sugars, and salt. Eat the right amount of calories for you.Get information about a proper diet from your health care provider, if necessary.  Regular physical exercise is one of the most important things you can do for your health. Most adults should get at least 150 minutes of moderate-intensity exercise (any activity that increases your heart rate and causes you to sweat) each week. In addition, most adults need muscle-strengthening exercises on 2 or more days a week.  Maintain a healthy weight. The body mass index (BMI) is a screening tool to identify possible weight problems. It provides an estimate of body fat based on height and weight. Your health care provider can find your BMI and can help you achieve or maintain a healthy weight.For adults 20 years and older:  A BMI below 18.5 is considered underweight.  A BMI of 18.5 to 24.9 is normal.  A BMI of 25 to 29.9 is considered overweight.  A BMI of 30 and above is considered obese.  Maintain normal blood lipids and cholesterol levels by exercising and minimizing your intake of saturated fat. Eat a balanced diet with plenty of fruit and vegetables. If your lipid or cholesterol levels are high, you are over 50, or you are at high risk for heart disease, you may need your cholesterol levels checked more frequently.Ongoing high lipid and cholesterol levels should be treated with medicines if diet and exercise are not working.  If you smoke, find out from your health care provider how to quit. If you do not use tobacco, do not start.  Lung cancer screening is recommended for adults aged 17-80 years who are  at high risk for developing lung cancer because of a history of smoking. A yearly low-dose CT scan of the lungs is recommended for people who have at least a 30-pack-year history of smoking and are a current smoker or have quit within the past 15 years. A pack year of smoking is smoking an average of 1 pack of cigarettes a day for 1 year (for example: 1 pack a day for 30 years or 2 packs a day for 15 years). Yearly screening should continue until the smoker has stopped smoking for at least 15 years. Yearly screening should be stopped for people who develop a health problem that would prevent them from having lung cancer treatment.  Avoid use of street drugs. Do not share needles with anyone. Ask for help if you need support or instructions about stopping the use of drugs.  High blood pressure causes heart disease and increases the risk of stroke.  Ongoing high blood pressure should be treated with medicines if weight loss and exercise do not work.  If you are 55-79  years old, ask your health care provider if you should take aspirin to prevent strokes.  Diabetes screening involves taking a blood sample to check your fasting blood sugar level. This should be done once every 3 years, after age 3, if you are within normal weight and without risk factors for diabetes. Testing should be considered at a younger age or be carried out more frequently if you are overweight and have at least 1 risk factor for diabetes.  Breast cancer screening is essential preventive care for women. You should practice "breast self-awareness." This means understanding the normal appearance and feel of your breasts and may include breast self-examination. Any changes detected, no matter how small, should be reported to a health care provider. Women in their 10s and 30s should have a clinical breast exam (CBE) by a health care provider as part of a regular health exam every 1 to 3 years. After age 67, women should have a CBE every  year. Starting at age 66, women should consider having a mammogram (breast X-ray test) every year. Women who have a family history of breast cancer should talk to their health care provider about genetic screening. Women at a high risk of breast cancer should talk to their health care providers about having an MRI and a mammogram every year.  Breast cancer gene (BRCA)-related cancer risk assessment is recommended for women who have family members with BRCA-related cancers. BRCA-related cancers include breast, ovarian, tubal, and peritoneal cancers. Having family members with these cancers may be associated with an increased risk for harmful changes (mutations) in the breast cancer genes BRCA1 and BRCA2. Results of the assessment will determine the need for genetic counseling and BRCA1 and BRCA2 testing.  Routine pelvic exams to screen for cancer are no longer recommended for nonpregnant women who are considered low risk for cancer of the pelvic organs (ovaries, uterus, and vagina) and who do not have symptoms. Ask your health care provider if a screening pelvic exam is right for you.  If you have had past treatment for cervical cancer or a condition that could lead to cancer, you need Pap tests and screening for cancer for at least 20 years after your treatment. If Pap tests have been discontinued, your risk factors (such as having a new sexual partner) need to be reassessed to determine if screening should be resumed. Some women have medical problems that increase the chance of getting cervical cancer. In these cases, your health care provider may recommend more frequent screening and Pap tests.    Colorectal cancer can be detected and often prevented. Most routine colorectal cancer screening begins at the age of 97 years and continues through age 68 years. However, your health care provider may recommend screening at an earlier age if you have risk factors for colon cancer. On a yearly basis, your health  care provider may provide home test kits to check for hidden blood in the stool. Use of a small camera at the end of a tube, to directly examine the colon (sigmoidoscopy or colonoscopy), can detect the earliest forms of colorectal cancer. Talk to your health care provider about this at age 65, when routine screening begins. Direct exam of the colon should be repeated every 5-10 years through age 81 years, unless early forms of pre-cancerous polyps or small growths are found.  Osteoporosis is a disease in which the bones lose minerals and strength with aging. This can result in serious bone fractures or breaks. The risk of osteoporosis  can be identified using a bone density scan. Women ages 13 years and over and women at risk for fractures or osteoporosis should discuss screening with their health care providers. Ask your health care provider whether you should take a calcium supplement or vitamin D to reduce the rate of osteoporosis.  Menopause can be associated with physical symptoms and risks. Hormone replacement therapy is available to decrease symptoms and risks. You should talk to your health care provider about whether hormone replacement therapy is right for you.  Use sunscreen. Apply sunscreen liberally and repeatedly throughout the day. You should seek shade when your shadow is shorter than you. Protect yourself by wearing long sleeves, pants, a wide-brimmed hat, and sunglasses year round, whenever you are outdoors.  Once a month, do a whole body skin exam, using a mirror to look at the skin on your back. Tell your health care provider of new moles, moles that have irregular borders, moles that are larger than a pencil eraser, or moles that have changed in shape or color.  Stay current with required vaccines (immunizations).  Influenza vaccine. All adults should be immunized every year.  Tetanus, diphtheria, and acellular pertussis (Td, Tdap) vaccine. Pregnant women should receive 1 dose of  Tdap vaccine during each pregnancy. The dose should be obtained regardless of the length of time since the last dose. Immunization is preferred during the 27th-36th week of gestation. An adult who has not previously received Tdap or who does not know her vaccine status should receive 1 dose of Tdap. This initial dose should be followed by tetanus and diphtheria toxoids (Td) booster doses every 10 years. Adults with an unknown or incomplete history of completing a 3-dose immunization series with Td-containing vaccines should begin or complete a primary immunization series including a Tdap dose. Adults should receive a Td booster every 10 years.    Zoster vaccine. One dose is recommended for adults aged 35 years or older unless certain conditions are present.    Pneumococcal 13-valent conjugate (PCV13) vaccine. When indicated, a person who is uncertain of her immunization history and has no record of immunization should receive the PCV13 vaccine. An adult aged 71 years or older who has certain medical conditions and has not been previously immunized should receive 1 dose of PCV13 vaccine. This PCV13 should be followed with a dose of pneumococcal polysaccharide (PPSV23) vaccine. The PPSV23 vaccine dose should be obtained at least 8 weeks after the dose of PCV13 vaccine. An adult aged 63 years or older who has certain medical conditions and previously received 1 or more doses of PPSV23 vaccine should receive 1 dose of PCV13. The PCV13 vaccine dose should be obtained 1 or more years after the last PPSV23 vaccine dose.    Pneumococcal polysaccharide (PPSV23) vaccine. When PCV13 is also indicated, PCV13 should be obtained first. All adults aged 40 years and older should be immunized. An adult younger than age 62 years who has certain medical conditions should be immunized. Any person who resides in a nursing home or long-term care facility should be immunized. An adult smoker should be immunized. People with an  immunocompromised condition and certain other conditions should receive both PCV13 and PPSV23 vaccines. People with human immunodeficiency virus (HIV) infection should be immunized as soon as possible after diagnosis. Immunization during chemotherapy or radiation therapy should be avoided. Routine use of PPSV23 vaccine is not recommended for American Indians, [place]atives, or people younger than 65 years unless there are medical conditions that require  PPSV23 vaccine. When indicated, people who have unknown immunization and have no record of immunization should receive PPSV23 vaccine. One-time revaccination 5 years after the first dose of PPSV23 is recommended for people aged 19-64 years who have chronic kidney failure, nephrotic syndrome, asplenia, or immunocompromised conditions. People who received 1-2 doses of PPSV23 before age 6 years should receive another dose of PPSV23 vaccine at age 81 years or later if at least 5 years have passed since the previous dose. Doses of PPSV23 are not needed for people immunized with PPSV23 at or after age 25 years.   Preventive Services / Frequency  Ages 65 years and over  Blood pressure check.  Lipid and cholesterol check.  Lung cancer screening. / Every year if you are aged 72-80 years and have a 30-pack-year history of smoking and currently smoke or have quit within the past 15 years. Yearly screening is stopped once you have quit smoking for at least 15 years or develop a health problem that would prevent you from having lung cancer treatment.  Clinical breast exam.** / Every year after age 28 years.  BRCA-related cancer risk assessment.** / For women who have family members with a BRCA-related cancer (breast, ovarian, tubal, or peritoneal cancers).  Mammogram.** / Every year beginning at age 67 years and continuing for as long as you are in good health. Consult with your health care provider.  Pap test.** / Every 3 years starting at age 44 years  through age 38 or 31 years with 3 consecutive normal Pap tests. Testing can be stopped between 65 and 70 years with 3 consecutive normal Pap tests and no abnormal Pap or HPV tests in the past 10 years.  Fecal occult blood test (FOBT) of stool. / Every year beginning at age 97 years and continuing until age 31 years. You may not need to do this test if you get a colonoscopy every 10 years.  Flexible sigmoidoscopy or colonoscopy.** / Every 5 years for a flexible sigmoidoscopy or every 10 years for a colonoscopy beginning at age 3 years and continuing until age 60 years.  Hepatitis C blood test.** / For all people born from [birth year] through 1963 and any individual with known risks for hepatitis C.  Osteoporosis screening.** / A one-time screening for women ages 44 years and over and women at risk for fractures or osteoporosis.  Skin self-exam. / Monthly.  Influenza vaccine. / Every year.  Tetanus, diphtheria, and acellular pertussis (Tdap/Td) vaccine.** / 1 dose of Td every 10 years.  Zoster vaccine.** / 1 dose for adults aged 72 years or older.  Pneumococcal 13-valent conjugate (PCV13) vaccine.** / Consult your health care provider.  Pneumococcal polysaccharide (PPSV23) vaccine.** / 1 dose for all adults aged 64 years and older. Screening for abdominal aortic aneurysm (AAA)  by ultrasound is recommended for people who have history of high blood pressure or who are current or former smokers.

## 2015-07-06 NOTE — Progress Notes (Signed)
Patient ID: Brooke Conley, female   DOB: 29-Oct-1927, 81 y.o.   MRN: [id number]  MEDICARE ANNUAL WELLNESS VISIT AND CPE  Assessment:   1. Essential hypertension  - Microalbumin / creatinine urine ratio - EKG 12-Lead - TSH  2. Hyperlipidemia  - Lipid panel  3. Prediabetes  - Hemoglobin A1c - Insulin, random  4. Vitamin D deficiency  - Vit D  25 hydroxy   5. Hypothyroidism   6. Osteoporosis   7. History of uterine cancer   8. History of breast cancer   9. Screening for rectal cancer  - POC Hemoccult Bld/Stl   10. Depression screen   11. At low risk for fall   12. Body mass index (BMI) of 19 or less in adult   13. Need for prophylactic vaccination against Streptococcus pneumoniae   - Pneumococcal conjugate vaccine 13-valent  14. Medication management  - Urine Microscopic - CBC with Differential/Platelet - BASIC METABOLIC PANEL WITH GFR - Hepatic function panel - Magnesium  15. Medicare annual wellness visit, initial  Plan:   During the course of the visit the patient was educated and counseled about appropriate screening and preventive services including:    Pneumococcal vaccine   Influenza vaccine  Td vaccine  Screening electrocardiogram  Bone densitometry screening  Colorectal cancer screening  Diabetes screening  Glaucoma screening  Nutrition counseling   Advanced directives: requested  Screening recommendations, referrals: Vaccinations:  Immunization History  Administered Date(s) Administered  . Influenza Split 09/11/2014  . Influenza-Unspecified 08/05/2014  . Td 06/24/2013  . Zoster 12/06/2011  Pneumococcal vaccine deferred Prevnar vaccine 07/06/2015 Hep B vaccine not indicated  Nutrition assessed and recommended  Colonoscopy declined Recommended yearly ophthalmology/optometry visit for glaucoma screening and checkup Recommended yearly dental visit for hygiene and checkup Advanced directives -  yes  Conditions/risks identified: BMI: Discussed weight loss, diet, and increase physical activity.  Increase physical activity: AHA recommends 150 minutes of physical activity a week.  Medications reviewed PreDiabetes is not at goal, ACE/ARB therapy: Not Indicated Urinary Incontinence is not an issue: discussed non pharmacology and pharmacology options.  Fall risk: low- discussed PT, home fall assessment, medications.   Subjective:    Brooke Conley  presents for Medicare Annual Wellness Visit and complete physical.  No prior medicare wellness visit is known.  This very nice 81 y.o.DWF presents for 3 month follow up with Hypertension, Hyperlipidemia, Pre-Diabetes and Vitamin D Deficiency.    Patient is treated for HTN & BP has been controlled at home. Today's BP: 124/78 mmHg. Patient has had no complaints of any cardiac type chest pain, palpitations, dyspnea/orthopnea/PND, dizziness, claudication, or dependent edema.   Hyperlipidemia is controlled with diet & meds. Patient denies myalgias or other med SE's. Last Lipids were at goal as below: Lab Results  Component Value Date   CHOL 161 06/26/2014   HDL 83 06/26/2014   LDLCALC 61 06/26/2014   TRIG 86 06/26/2014   CHOLHDL 1.9 06/26/2014    Also, the patient has history of PreDiabetes and denies symptoms of reactive hypoglycemia, diabetic polys, paresthesias or visual blurring.  Last A1c was 6.2% on July 23,2015.    Further, the patient also has history of Vitamin D Deficiency and supplements vitamin D without any suspected side-effects. Last vitamin D was 46 on May 2012  Names of Other Physician/Practitioners you currently use: 1. [facility] here for primary care 2. Dr Herbert Deaner, eye doctor, last visit July 2016 3. No  dentist, dentures  Patient Care Team: [name], MD as PCP - General (Internal Medicine) Chauncey Cruel, MD as Consulting Physician (Oncology) Franchot Gallo, MD as  Consulting Physician (Urology) Lavonna Monarch, MD as Consulting Physician (Dermatology)  Medication Review: Current Outpatient Prescriptions on File Prior to Visit  Medication Sig Dispense Refill  . BABY ASPIRIN PO Take 81 mg by mouth daily.    Marland Kitchen levothyroxine (SYNTHROID, LEVOTHROID) 25 MCG tablet Take 25 mcg by mouth daily before breakfast.    . tamoxifen (NOLVADEX) 20 MG tablet Take 1 tablet by mouth  every day for osteoporosis 90 tablet 0  . triamterene-hydrochlorothiazide (MAXZIDE-25) 37.5-25 MG per tablet Take 1 tablet by mouth  every day for blood  pressure and fluid. 90 tablet 1   No current facility-administered medications on file prior to visit.   Allergies  Allergen Reactions  . Penicillins    Current Problems (verified) Patient Active Problem List   Diagnosis Date Noted  . Hypothyroid 07/06/2015  . Medicare annual wellness visit, initial 07/06/2015  . Medication management 06/26/2014  . Prediabetes   . Hypertension   . Hyperlipidemia   . Osteoporosis   . History of breast cancer   . Vitamin D deficiency   . History of uterine cancer    Screening Tests Health Maintenance  Topic Date Due  . COLONOSCOPY  05/15/1979  . DEXA SCAN  05/14/1994  . INFLUENZA VACCINE  07/06/2015  . PNA vac Low Risk Adult (2 of 2 - PPSV23) 07/05/2016  . TETANUS/TDAP  06/25/2023  . ZOSTAVAX  Completed   Immunization History  Administered Date(s) Administered  . Influenza Split 09/11/2014  . Influenza-Unspecified 08/05/2014  . Pneumococcal Conjugate-13 07/06/2015  . Td 06/24/2013  . Zoster 12/06/2011   Preventative care: Last colonoscopy: never  Past Medical History  Diagnosis Date  . Prediabetes   . Hypertension   . Hyperlipidemia   . Osteoporosis   . History of breast cancer   . Vitamin D deficiency   . History of uterine cancer 1982   Past Surgical History  Procedure Laterality Date  . Cosmetic surgery Bilateral 1979    Breast  . Breast surgery Right 2007     lumpectomy  . Tonsillectomy    . Cataract extraction Right 2010  . Total abdominal hysterectomy w/ bilateral salpingoophorectomy      Risk Factors: Tobacco History  Substance Use Topics  . Smoking status: Never Smoker   . Smokeless tobacco: Never Used  . Alcohol Use: No   She does not smoke.  Patient is not a former smoker. Are there smokers in your home (other than you)?  No Alcohol Current alcohol use: none  Caffeine Current caffeine use: denies use  Exercise Current exercise: gardening, housecleaning, walking and yard work  Nutrition/Diet Current diet: in general, a "healthy" diet    Cardiac risk factors: advanced age (older than 41 for men, 62 for women) and hypertension.  Depression Screen (Note: if answer to either of the following is "Yes", a more complete depression screening is indicated)   Q1: Over the past two weeks, have you felt down, depressed or hopeless? No  Q2: Over the past two weeks, have you felt little interest or pleasure in doing things? No  Have you lost interest or pleasure in daily life? No  Do you often feel hopeless? No  Do you cry easily over simple problems? No  Activities of Daily Living In your present state of health, do you have any difficulty performing the following activities?:  Driving? No Managing money?  No Feeding yourself? No Getting from bed to chair? No Climbing a flight of stairs? No Preparing food and eating?: No Bathing or showering? No Getting dressed: No Getting to the toilet? No Using the toilet:No Moving around from place to place: No In the past year have you fallen or had a near fall?:No   Are you sexually active?  No  Do you have more than one partner?  No  Vision Difficulties: No  Hearing Difficulties: No Do you often ask people to speak up or repeat themselves? No Do you experience ringing or noises in your ears? No Do you have difficulty understanding soft or whispered voices?  Sometimes.  Cognition  Do you feel that you have a problem with memory?No  Do you often misplace items? No  Do you feel safe at home?  Yes  Advanced directives Does patient have a [place]? Yes Does patient have a Living Will? Yes  ROS: Constitutional: Denies fever, chills, weight loss/gain, headaches, insomnia, fatigue, night sweats, and change in appetite. Eyes: Denies redness, blurred vision, diplopia, discharge, itchy, watery eyes.  ENT: Denies discharge, congestion, post nasal drip, epistaxis, sore throat, earache, hearing loss, dental pain, Tinnitus, Vertigo, Sinus pain, snoring.  Cardio: Denies chest pain, palpitations, irregular heartbeat, syncope, dyspnea, diaphoresis, orthopnea, PND, claudication, edema Respiratory: denies cough, dyspnea, DOE, pleurisy, hoarseness, laryngitis, wheezing.  Gastrointestinal: Denies dysphagia, heartburn, reflux, water brash, pain, cramps, nausea, vomiting, bloating, diarrhea, constipation, hematemesis, melena, hematochezia, jaundice, hemorrhoids Genitourinary: Denies dysuria, frequency, urgency, nocturia, hesitancy, discharge, hematuria, flank pain Breast: Breast lumps, nipple discharge, bleeding.  Musculoskeletal: Denies arthralgia, myalgia, stiffness, Jt. Swelling, pain, limp, and strain/sprain. Denies falls. Skin: Denies puritis, rash, hives, warts, acne, eczema, changing in skin lesion Neuro: No weakness, tremor, incoordination, spasms, paresthesia, pain Psychiatric: Denies confusion, memory loss, sensory loss. Denies Depression. Endocrine: Denies change in weight, skin, hair change, nocturia, and paresthesia, diabetic polys, visual blurring, hyper / hypo glycemic episodes.  Heme/Lymph: No excessive bleeding, bruising, enlarged lymph nodes  Objective:     BP 124/78   Pulse 64  Temp 97.3 F   Resp 16  Ht 5' 2.75"  Wt 94 lb 12.8 oz     BMI 16.92   General Appearance: Well nourished, alert, WD/WN, female and in no  apparent distress. Eyes: PERRLA, EOMs, conjunctiva no swelling or erythema, normal fundi and vessels. Sinuses: No frontal/maxillary tenderness ENT/Mouth: EACs patent / TMs  nl. Nares clear without erythema, swelling, mucoid exudates. Oral hygiene is good. No erythema, swelling, or exudate. Tongue normal, non-obstructing. Tonsils not swollen or erythematous. Hearing normal.  Neck: Supple, thyroid normal. No bruits, nodes or JVD. Respiratory: Respiratory effort normal.  BS equal and clear bilateral without rales, rhonci, wheezing or stridor. Cardio: Heart sounds are normal with regular rate and rhythm and no murmurs, rubs or gallops. Peripheral pulses are normal and equal bilaterally without edema. No aortic or femoral bruits. Chest: symmetric with normal excursions and percussion. Breasts: Symmetric and hardened mobile implants w/o palpable nodularity.  Abdomen: Flat, soft  with nl bowel sounds. Nontender, no guarding, rebound, hernias, masses, or organomegaly.  Lymphatics: Non tender without lymphadenopathy.  Musculoskeletal: Full ROM all peripheral extremities, joint stability, 5/5 strength, and normal gait. Skin: Warm and dry without rashes, lesions, cyanosis, clubbing or  ecchymosis.  Neuro: Cranial nerves intact, reflexes equal bilaterally. Normal muscle tone, no cerebellar symptoms. Sensation intact.  Pysch: Alert and oriented X 3, normal affect, Insight and Judgment appropriate.  Cognitive Testing  Alert? Yes  Normal Appearance?Yes  Oriented to person? Yes  Place? Yes   Time? Yes  Recall of three objects?  Yes  Can perform simple calculations? Yes  Displays appropriate judgment? Yes  Can read the correct time from a watch/clock?Yes  Medicare Attestation I have personally reviewed: The patient's medical and social history Their use of alcohol, tobacco or illicit drugs Their current medications and supplements The patient's functional ability including ADLs,fall risks, home safety  risks, cognitive, and hearing and visual impairment Diet and physical activities Evidence for depression or mood disorders  The patient's weight, height, BMI, and visual acuity have been recorded in the chart.  I have made referrals, counseling, and provided education to the patient based on review of the above and I have provided the patient with a written personalized care plan for preventive services.  Over 40 minutes of exam, counseling, chart review was performed.  Azilee Pirro DAVID, MD   07/06/2015

## 2015-07-07 DIAGNOSIS — L718 Other rosacea: Secondary | ICD-10-CM | POA: Diagnosis not present

## 2015-07-07 DIAGNOSIS — L570 Actinic keratosis: Secondary | ICD-10-CM | POA: Diagnosis not present

## 2015-07-07 DIAGNOSIS — Z85828 Personal history of other malignant neoplasm of skin: Secondary | ICD-10-CM | POA: Diagnosis not present

## 2015-07-07 DIAGNOSIS — L821 Other seborrheic keratosis: Secondary | ICD-10-CM | POA: Diagnosis not present

## 2015-07-07 LAB — BASIC METABOLIC PANEL WITH GFR
BUN: 33 mg/dL — ABNORMAL HIGH (ref 7–25)
CALCIUM: 9.5 mg/dL (ref 8.6–10.4)
CHLORIDE: 99 mmol/L (ref 98–110)
CO2: 26 mmol/L (ref 20–31)
Creat: 1.7 mg/dL — ABNORMAL HIGH (ref 0.60–0.88)
GFR, EST AFRICAN AMERICAN: 31 mL/min — AB (ref >=60)
GFR, EST NON AFRICAN AMERICAN: 27 mL/min — AB (ref >=60)
GLUCOSE: 84 mg/dL (ref 65–99)
POTASSIUM: 4.1 mmol/L (ref 3.5–5.3)
Sodium: 138 mmol/L (ref 135–146)

## 2015-07-07 LAB — CBC WITH DIFFERENTIAL/PLATELET
BASOS PCT: 0 % (ref 0–1)
Basophils Absolute: 0 10*3/uL (ref 0.0–0.1)
EOS PCT: 2 % (ref 0–5)
Eosinophils Absolute: 0.1 10*3/uL (ref 0.0–0.7)
HCT: 34.5 % — ABNORMAL LOW (ref 36.0–46.0)
Hemoglobin: 11.1 g/dL — ABNORMAL LOW (ref 12.0–15.0)
Lymphocytes Relative: 24 % (ref 12–46)
Lymphs Abs: 1.3 10*3/uL (ref 0.7–4.0)
MCH: 29.4 pg (ref 26.0–34.0)
MCHC: 32.2 g/dL (ref 30.0–36.0)
MCV: 91.5 fL (ref 78.0–100.0)
MPV: 10.2 fL (ref 8.6–12.4)
Monocytes Absolute: 0.4 10*3/uL (ref 0.1–1.0)
Monocytes Relative: 8 % (ref 3–12)
NEUTROS ABS: 3.5 10*3/uL (ref 1.7–7.7)
Neutrophils Relative %: 66 % (ref 43–77)
Platelets: 134 10*3/uL — ABNORMAL LOW (ref 150–400)
RBC: 3.77 MIL/uL — AB (ref 3.87–5.11)
RDW: 13.8 % (ref 11.5–15.5)
WBC: 5.3 10*3/uL (ref 4.0–10.5)

## 2015-07-07 LAB — HEPATIC FUNCTION PANEL
ALK PHOS: 37 U/L (ref 33–130)
ALT: 8 U/L (ref 6–29)
AST: 21 U/L (ref 10–35)
Albumin: 4.2 g/dL (ref 3.6–5.1)
Bilirubin, Direct: 0.1 mg/dL (ref <=0.2)
Indirect Bilirubin: 0.2 mg/dL (ref 0.2–1.2)
TOTAL PROTEIN: 7 g/dL (ref 6.1–8.1)
Total Bilirubin: 0.3 mg/dL (ref 0.2–1.2)

## 2015-07-07 LAB — URINALYSIS, MICROSCOPIC ONLY
Bacteria, UA: NONE SEEN [HPF]
Casts: NONE SEEN [LPF]
Crystals: NONE SEEN [HPF]
RBC / HPF: NONE SEEN RBC/HPF
Squamous Epithelial / HPF: NONE SEEN [HPF]
WBC, UA: NONE SEEN WBC/HPF
Yeast: NONE SEEN [HPF]

## 2015-07-07 LAB — LIPID PANEL
CHOL/HDL RATIO: 1.7 ratio (ref <=5.0)
CHOLESTEROL: 149 mg/dL (ref 125–200)
HDL: 86 mg/dL (ref >=46)
LDL Cholesterol: 47 mg/dL (ref <130)
Triglycerides: 82 mg/dL (ref <150)
VLDL: 16 mg/dL (ref <30)

## 2015-07-07 LAB — MICROALBUMIN / CREATININE URINE RATIO
Creatinine, Urine: 122.3 mg/dL
Microalb Creat Ratio: 98.1 mg/g — ABNORMAL HIGH (ref 0.0–30.0)
Microalb, Ur: 12 mg/dL — ABNORMAL HIGH (ref <2.0)

## 2015-07-07 LAB — TSH: TSH: 2.803 u[IU]/mL (ref 0.350–4.500)

## 2015-07-07 LAB — INSULIN, RANDOM: Insulin: 2.3 u[IU]/mL (ref 2.0–19.6)

## 2015-07-07 LAB — HEMOGLOBIN A1C
Hgb A1c MFr Bld: 6 % — ABNORMAL HIGH (ref <5.7)
Mean Plasma Glucose: 126 mg/dL — ABNORMAL HIGH (ref <117)

## 2015-07-07 LAB — VITAMIN D 25 HYDROXY (VIT D DEFICIENCY, FRACTURES): Vit D, 25-Hydroxy: 36 ng/mL (ref 30–100)

## 2015-07-07 LAB — MAGNESIUM: Magnesium: 1.7 mg/dL (ref 1.5–2.5)

## 2015-07-10 ENCOUNTER — Other Ambulatory Visit: Payer: Self-pay | Admitting: Internal Medicine

## 2015-07-28 ENCOUNTER — Other Ambulatory Visit: Payer: Self-pay | Admitting: *Deleted

## 2015-07-28 DIAGNOSIS — Z1212 Encounter for screening for malignant neoplasm of rectum: Secondary | ICD-10-CM

## 2015-07-28 LAB — POC HEMOCCULT BLD/STL (HOME/3-CARD/SCREEN)
Card #2 Fecal Occult Blod, POC: NEGATIVE
Card #3 Fecal Occult Blood, POC: NEGATIVE
Fecal Occult Blood, POC: NEGATIVE

## 2015-08-19 ENCOUNTER — Other Ambulatory Visit: Payer: Self-pay | Admitting: Physician Assistant

## 2015-09-01 DIAGNOSIS — L570 Actinic keratosis: Secondary | ICD-10-CM | POA: Diagnosis not present

## 2015-09-01 DIAGNOSIS — Z85828 Personal history of other malignant neoplasm of skin: Secondary | ICD-10-CM | POA: Diagnosis not present

## 2015-09-01 DIAGNOSIS — D692 Other nonthrombocytopenic purpura: Secondary | ICD-10-CM | POA: Diagnosis not present

## 2015-09-23 DIAGNOSIS — H01003 Unspecified blepharitis right eye, unspecified eyelid: Secondary | ICD-10-CM | POA: Diagnosis not present

## 2015-09-23 DIAGNOSIS — H524 Presbyopia: Secondary | ICD-10-CM | POA: Diagnosis not present

## 2015-09-23 DIAGNOSIS — H2512 Age-related nuclear cataract, left eye: Secondary | ICD-10-CM | POA: Diagnosis not present

## 2015-09-23 DIAGNOSIS — H353130 Nonexudative age-related macular degeneration, bilateral, stage unspecified: Secondary | ICD-10-CM | POA: Diagnosis not present

## 2015-10-05 DIAGNOSIS — H26491 Other secondary cataract, right eye: Secondary | ICD-10-CM | POA: Diagnosis not present

## 2015-10-07 ENCOUNTER — Other Ambulatory Visit: Payer: Self-pay | Admitting: Internal Medicine

## 2015-10-13 DIAGNOSIS — Z85828 Personal history of other malignant neoplasm of skin: Secondary | ICD-10-CM | POA: Diagnosis not present

## 2015-10-13 DIAGNOSIS — L570 Actinic keratosis: Secondary | ICD-10-CM | POA: Diagnosis not present

## 2015-12-10 DIAGNOSIS — H01003 Unspecified blepharitis right eye, unspecified eyelid: Secondary | ICD-10-CM | POA: Diagnosis not present

## 2015-12-10 DIAGNOSIS — H353122 Nonexudative age-related macular degeneration, left eye, intermediate dry stage: Secondary | ICD-10-CM | POA: Diagnosis not present

## 2015-12-10 DIAGNOSIS — H353130 Nonexudative age-related macular degeneration, bilateral, stage unspecified: Secondary | ICD-10-CM | POA: Diagnosis not present

## 2015-12-10 DIAGNOSIS — H524 Presbyopia: Secondary | ICD-10-CM | POA: Diagnosis not present

## 2015-12-10 DIAGNOSIS — H2512 Age-related nuclear cataract, left eye: Secondary | ICD-10-CM | POA: Diagnosis not present

## 2015-12-10 DIAGNOSIS — H353112 Nonexudative age-related macular degeneration, right eye, intermediate dry stage: Secondary | ICD-10-CM | POA: Diagnosis not present

## 2015-12-22 DIAGNOSIS — H2512 Age-related nuclear cataract, left eye: Secondary | ICD-10-CM | POA: Diagnosis not present

## 2016-01-11 ENCOUNTER — Other Ambulatory Visit: Payer: Self-pay | Admitting: Internal Medicine

## 2016-01-11 ENCOUNTER — Ambulatory Visit: Payer: Self-pay | Admitting: Internal Medicine

## 2016-07-18 ENCOUNTER — Other Ambulatory Visit: Payer: Self-pay | Admitting: *Deleted

## 2016-07-18 MED ORDER — LEVOTHYROXINE SODIUM 50 MCG PO TABS: ORAL_TABLET | ORAL | 0 refills | Status: DC

## 2016-07-26 ENCOUNTER — Ambulatory Visit (INDEPENDENT_AMBULATORY_CARE_PROVIDER_SITE_OTHER): Payer: Medicare Other | Admitting: Internal Medicine

## 2016-07-26 ENCOUNTER — Encounter: Payer: Self-pay | Admitting: Internal Medicine

## 2016-07-26 VITALS — BP 120/68 | HR 92 | Temp 97.5°F | Resp 16 | Ht 62.0 in | Wt 94.0 lb

## 2016-07-26 DIAGNOSIS — Z23 Encounter for immunization: Secondary | ICD-10-CM | POA: Diagnosis not present

## 2016-07-26 DIAGNOSIS — E785 Hyperlipidemia, unspecified: Secondary | ICD-10-CM

## 2016-07-26 DIAGNOSIS — Z1211 Encounter for screening for malignant neoplasm of colon: Secondary | ICD-10-CM

## 2016-07-26 DIAGNOSIS — Z0001 Encounter for general adult medical examination with abnormal findings: Secondary | ICD-10-CM

## 2016-07-26 DIAGNOSIS — R6889 Other general symptoms and signs: Secondary | ICD-10-CM | POA: Diagnosis not present

## 2016-07-26 DIAGNOSIS — E559 Vitamin D deficiency, unspecified: Secondary | ICD-10-CM | POA: Diagnosis not present

## 2016-07-26 DIAGNOSIS — R7303 Prediabetes: Secondary | ICD-10-CM | POA: Diagnosis not present

## 2016-07-26 DIAGNOSIS — Z0000 Encounter for general adult medical examination without abnormal findings: Secondary | ICD-10-CM | POA: Diagnosis not present

## 2016-07-26 DIAGNOSIS — I10 Essential (primary) hypertension: Secondary | ICD-10-CM | POA: Diagnosis not present

## 2016-07-26 DIAGNOSIS — M810 Age-related osteoporosis without current pathological fracture: Secondary | ICD-10-CM

## 2016-07-26 DIAGNOSIS — Z136 Encounter for screening for cardiovascular disorders: Secondary | ICD-10-CM

## 2016-07-26 DIAGNOSIS — Z79899 Other long term (current) drug therapy: Secondary | ICD-10-CM

## 2016-07-26 DIAGNOSIS — E039 Hypothyroidism, unspecified: Secondary | ICD-10-CM

## 2016-07-26 LAB — BASIC METABOLIC PANEL WITH GFR
BUN: 36 mg/dL — ABNORMAL HIGH (ref 7–25)
CHLORIDE: 100 mmol/L (ref 98–110)
CO2: 28 mmol/L (ref 20–31)
CREATININE: 1.57 mg/dL — AB (ref 0.60–0.88)
Calcium: 9.8 mg/dL (ref 8.6–10.4)
GFR, Est African American: 34 mL/min — ABNORMAL LOW (ref >=60)
GFR, Est Non African American: 29 mL/min — ABNORMAL LOW (ref >=60)
Glucose, Bld: 95 mg/dL (ref 65–99)
Potassium: 4 mmol/L (ref 3.5–5.3)
SODIUM: 138 mmol/L (ref 135–146)

## 2016-07-26 LAB — HEPATIC FUNCTION PANEL
ALT: 9 U/L (ref 6–29)
AST: 18 U/L (ref 10–35)
Albumin: 4.1 g/dL (ref 3.6–5.1)
Alkaline Phosphatase: 39 U/L (ref 33–130)
BILIRUBIN DIRECT: 0.1 mg/dL (ref <=0.2)
Indirect Bilirubin: 0.2 mg/dL (ref 0.2–1.2)
Total Bilirubin: 0.3 mg/dL (ref 0.2–1.2)
Total Protein: 7.3 g/dL (ref 6.1–8.1)

## 2016-07-26 LAB — CBC WITH DIFFERENTIAL/PLATELET
BASOS ABS: 58 {cells}/uL (ref 0–200)
Basophils Relative: 1 %
EOS ABS: 174 {cells}/uL (ref 15–500)
EOS PCT: 3 %
HCT: 37 % (ref 35.0–45.0)
HEMOGLOBIN: 11.8 g/dL (ref 11.7–15.5)
LYMPHS ABS: 1566 {cells}/uL (ref 850–3900)
Lymphocytes Relative: 27 %
MCH: 29.7 pg (ref 27.0–33.0)
MCHC: 31.9 g/dL — ABNORMAL LOW (ref 32.0–36.0)
MCV: 93.2 fL (ref 80.0–100.0)
MONO ABS: 522 {cells}/uL (ref 200–950)
MPV: 10.5 fL (ref 7.5–12.5)
Monocytes Relative: 9 %
NEUTROS ABS: 3480 {cells}/uL (ref 1500–7800)
Neutrophils Relative %: 60 %
Platelets: 148 10*3/uL (ref 140–400)
RBC: 3.97 MIL/uL (ref 3.80–5.10)
RDW: 13.9 % (ref 11.0–15.0)
WBC: 5.8 10*3/uL (ref 3.8–10.8)

## 2016-07-26 LAB — LIPID PANEL
CHOL/HDL RATIO: 1.6 ratio (ref <=5.0)
Cholesterol: 161 mg/dL (ref 125–200)
HDL: 99 mg/dL (ref >=46)
LDL CALC: 46 mg/dL (ref <130)
Triglycerides: 82 mg/dL (ref <150)
VLDL: 16 mg/dL (ref <30)

## 2016-07-26 LAB — MAGNESIUM: MAGNESIUM: 1.7 mg/dL (ref 1.5–2.5)

## 2016-07-26 NOTE — Progress Notes (Signed)
[facility]                   [name], M.D.    Uvaldo Bristle. Silverio Lay, P.A.-C      Starlyn Skeans, P.A.-C   [place] [address] [employer] [id number] Telephone [phone] Telefax [phone]    Annual Screening/Preventative Visit And Comprehensive Evaluation &  Examination     This very nice 82y.o.DWF presents for a Wellness/Preventative Visit & comprehensive evaluation and management of multiple medical co-morbidities.  Patient has been followed for HTN, T2_NIDDM  Prediabetes, Hyperlipidemia and Vitamin D Deficiency.      HTN predates circa 1979. Patient's BP has been controlled at home and patient denies any cardiac symptoms as chest pain, palpitations, shortness of breath, dizziness or ankle swelling. Today's BP: 120/68      Patient's hyperlipidemia is controlled with diet and medications. Patient denies myalgias or other medication SE's. Last lipids were at goal: Lab Results  Component Value Date   CHOL 149 07/06/2015   HDL 86 07/06/2015   LDLCALC 47 07/06/2015   TRIG 82 07/06/2015   CHOLHDL 1.7 07/06/2015      Patient has prediabetes circa 2013 with A1c 5.9% and patient denies reactive hypoglycemic symptoms, visual blurring, diabetic polys, or paresthesias. Last A1c was still in the rediabetic range: Lab Results  Component Value Date   HGBA1C 6.0 (H) 07/06/2015      Patient has been on Thyroid Replacement since July 2014. Finally, patient has history of Vitamin D Deficiency and last Vitamin D was not at goal: Lab Results  Component Value Date   VD25OH 36 07/06/2015   Current Outpatient Prescriptions on File Prior to Visit  Medication Sig  . BABY ASPIRIN PO Take 81 mg by mouth daily.  . Bilberry 1000 MG CAPS Take 1 capsule by mouth daily.  . Calcium Citrate-Vitamin D (CALCIUM + D PO) Take 1 tablet by mouth 2 (two) times daily. Takes calcium 600 mg and  vitamin D 800 units BID  . levothyroxine (SYNTHROID, LEVOTHROID) 25 MCG tablet Take 25 mcg by mouth daily before breakfast.  . levothyroxine (SYNTHROID, LEVOTHROID) 50 MCG tablet Take 1 tablet by mouth  daily before breakfast  . Omega-3 Fatty Acids (FISH OIL) 1000 MG CAPS Take 1 capsule by mouth daily.  . tamoxifen (NOLVADEX) 20 MG tablet TAKE 1 TABLET BY MOUTH  EVERY DAY FOR OSTEOPOROSIS  . triamterene-hydrochlorothiazide (MAXZIDE-25) 37.5-25 MG tablet Take 1 tablet by mouth  every day for blood  pressure and fluid.   No current facility-administered medications on file prior to visit.    Allergies  Allergen Reactions  . Penicillins    Past Medical History:  Diagnosis Date  . History of breast cancer   . History of uterine cancer 1982  . Hyperlipidemia   . Hypertension   . Osteoporosis   . Prediabetes   . Vitamin D deficiency    Health Maintenance  Topic Date Due  . DEXA SCAN  05/14/1994  . INFLUENZA VACCINE  07/05/2016  . PNA vac Low Risk Adult (2 of 2 - PPSV23) 07/05/2016  . TETANUS/TDAP  06/25/2023  . ZOSTAVAX  Completed   Immunization History  Administered Date(s) Administered  . Influenza Split 09/11/2014  . Influenza, High Dose Seasonal PF 07/26/2016  .  Influenza-Unspecified 08/05/2014, 09/25/2015  . Pneumococcal Conjugate-13 07/06/2015  . Td 06/24/2013  . Zoster 12/06/2011   Past Surgical History:  Procedure Laterality Date  . BREAST SURGERY Right 2007   lumpectomy  . CATARACT EXTRACTION Right 2010  . COSMETIC SURGERY Bilateral 1979   Breast  . TONSILLECTOMY    . TOTAL ABDOMINAL HYSTERECTOMY W/ BILATERAL SALPINGOOPHORECTOMY     Family History  Problem Relation Age of Onset  . Cancer Mother     lung  . Hypertension Father    Social History  Substance Use Topics  . Smoking status: Never Smoker  . Smokeless tobacco: Never Used  . Alcohol use No    ROS Constitutional: Denies fever, chills, weight loss/gain, headaches, insomnia,  night sweats, and  change in appetite. Does c/o fatigue. Eyes: Denies redness, blurred vision, diplopia, discharge, itchy, watery eyes.  ENT: Denies discharge, congestion, post nasal drip, epistaxis, sore throat, earache, hearing loss, dental pain, Tinnitus, Vertigo, Sinus pain, snoring.  Cardio: Denies chest pain, palpitations, irregular heartbeat, syncope, dyspnea, diaphoresis, orthopnea, PND, claudication, edema Respiratory: denies cough, dyspnea, DOE, pleurisy, hoarseness, laryngitis, wheezing.  Gastrointestinal: Denies dysphagia, heartburn, reflux, water brash, pain, cramps, nausea, vomiting, bloating, diarrhea, constipation, hematemesis, melena, hematochezia, jaundice, hemorrhoids Genitourinary: Denies dysuria, frequency, urgency, nocturia, hesitancy, discharge, hematuria, flank pain Breast: Breast lumps, nipple discharge, bleeding.  Musculoskeletal: Denies arthralgia, myalgia, stiffness, Jt. Swelling, pain, limp, and strain/sprain. Denies falls. Skin: Denies puritis, rash, hives, warts, acne, eczema, changing in skin lesion Neuro: No weakness, tremor, incoordination, spasms, paresthesia, pain Psychiatric: Denies confusion, memory loss, sensory loss. Denies Depression. Endocrine: Denies change in weight, skin, hair change, nocturia, and paresthesia, diabetic polys, visual blurring, hyper / hypo glycemic episodes.  Heme/Lymph: No excessive bleeding, bruising, enlarged lymph nodes.  Physical Exam  BP 120/68   Pulse 92   Temp 97.5 F (36.4 C)   Resp 16   Ht 5\' 2"  (1.575 m)   Wt 94 lb (42.6 kg)   SpO2 98%   BMI 17.19 kg/m   General Appearance: Well nourished appearing much younger than chronological age and in no apparent distress.  Eyes: PERRLA, EOMs, conjunctiva no swelling or erythema, normal fundi and vessels. Sinuses: No frontal/maxillary tenderness ENT/Mouth: EACs patent / TMs  nl. Nares clear without erythema, swelling, mucoid exudates. Oral hygiene is good. No erythema, swelling, or exudate.  Tongue normal, non-obstructing. Tonsils not swollen or erythematous. Hearing normal.  Neck: Supple, thyroid normal. No bruits, nodes or JVD. Respiratory: Respiratory effort normal.  BS equal and clear bilateral without rales, rhonci, wheezing or stridor. Cardio: Heart sounds are normal with regular rate and rhythm and no murmurs, rubs or gallops. Peripheral pulses are normal and equal bilaterally without edema. No aortic or femoral bruits. Chest: symmetric with normal excursions and percussion. Breasts: Very hardened implants bilaterally. No palpable abnormalities noted. Abdomen: Flat, soft with bowel sounds active. Nontender, no guarding, rebound, hernias, masses, or organomegaly.  Lymphatics: Non tender without lymphadenopathy.  Musculoskeletal: Full ROM all peripheral extremities, joint stability, 5/5 strength, and normal gait. Skin: Warm and dry without rashes, lesions, cyanosis, clubbing or  ecchymosis.  Neuro: Cranial nerves intact, reflexes equal bilaterally. Normal muscle tone, no cerebellar symptoms. Sensation intact.  Pysch: Alert and oriented X 3, normal affect, Insight and Judgment appropriate.   Assessment and Plan  1. Annual Preventative Screening Examination  - Microalbumin / creatinine urine ratio - EKG 12-Lead - POC Hemoccult Bld/Stl  - Urinalysis, Routine w reflex microscopic  - CBC with Differential/Platelet - BASIC METABOLIC  PANEL WITH GFR - Hepatic function panel - Magnesium - Lipid panel - TSH - Hemoglobin A1c - Insulin, random - VITAMIN D 25 Hydroxy  2. Essential hypertension  - Microalbumin / creatinine urine ratio - EKG 12-Lead - TSH  3. Hyperlipidemia  - Lipid panel - TSH  4. Prediabetes  - Hemoglobin A1c - Insulin, random  5. Vitamin D deficiency  - VITAMIN D 25 Hydroxy   6. Hypothyroidism   7. Osteoporosis   8. Flu vaccine need  - Flu vaccine HIGH DOSE PF  9. Screening for ischemic heart disease   10. Colon cancer  screening  - POC Hemoccult Bld/Stl  11. Medication management  - Urinalysis, Routine w reflex microscopic - CBC with Differential/Platelet - BASIC METABOLIC PANEL WITH GFR - Hepatic function panel - Magnesium     Continue prudent diet as discussed, weight control, BP monitoring, regular exercise, and medications. Discussed med's effects and SE's. Screening labs and tests as requested with regular follow-up as recommended. Over 40 minutes of exam, counseling, chart review and high complex critical decision making was performed.

## 2016-07-26 NOTE — Patient Instructions (Signed)

## 2016-07-27 ENCOUNTER — Encounter: Payer: Self-pay | Admitting: Internal Medicine

## 2016-07-27 LAB — URINALYSIS, MICROSCOPIC ONLY
CASTS: NONE SEEN [LPF]
CRYSTALS: NONE SEEN [HPF]
RBC / HPF: NONE SEEN RBC/HPF (ref <=2)
WBC, UA: >60 WBC/HPF — AB (ref <=5)
YEAST: NONE SEEN [HPF]

## 2016-07-27 LAB — URINALYSIS, ROUTINE W REFLEX MICROSCOPIC
BILIRUBIN URINE: NEGATIVE
Glucose, UA: NEGATIVE
Hgb urine dipstick: NEGATIVE
Ketones, ur: NEGATIVE
NITRITE: NEGATIVE
Specific Gravity, Urine: 1.015 (ref 1.001–1.035)
pH: 6 (ref 5.0–8.0)

## 2016-07-27 LAB — HEMOGLOBIN A1C
HEMOGLOBIN A1C: 5.7 % — AB (ref <5.7)
Mean Plasma Glucose: 117 mg/dL

## 2016-07-27 LAB — MICROALBUMIN / CREATININE URINE RATIO
CREATININE, URINE: 96 mg/dL (ref 20–320)
Microalb Creat Ratio: 141 mcg/mg creat — ABNORMAL HIGH (ref <30)
Microalb, Ur: 13.5 mg/dL

## 2016-07-27 LAB — VITAMIN D 25 HYDROXY (VIT D DEFICIENCY, FRACTURES): VIT D 25 HYDROXY: 41 ng/mL (ref 30–100)

## 2016-07-27 LAB — TSH: TSH: 3.9 m[IU]/L

## 2016-07-27 LAB — INSULIN, RANDOM: Insulin: 3.6 u[IU]/mL (ref 2.0–19.6)

## 2016-07-28 ENCOUNTER — Other Ambulatory Visit: Payer: Medicare Other

## 2016-07-28 ENCOUNTER — Other Ambulatory Visit: Payer: Self-pay | Admitting: Internal Medicine

## 2016-07-28 DIAGNOSIS — N390 Urinary tract infection, site not specified: Secondary | ICD-10-CM

## 2016-07-29 LAB — URINALYSIS, ROUTINE W REFLEX MICROSCOPIC
BILIRUBIN URINE: NEGATIVE
GLUCOSE, UA: NEGATIVE
Hgb urine dipstick: NEGATIVE
KETONES UR: NEGATIVE
Nitrite: POSITIVE — AB
PROTEIN: NEGATIVE
Specific Gravity, Urine: 1.012 (ref 1.001–1.035)
pH: 7 (ref 5.0–8.0)

## 2016-07-29 LAB — URINALYSIS, MICROSCOPIC ONLY
Casts: NONE SEEN [LPF]
Crystals: NONE SEEN [HPF]
Squamous Epithelial / LPF: NONE SEEN [HPF] (ref <=5)
YEAST: NONE SEEN [HPF]

## 2016-07-31 LAB — URINE CULTURE

## 2016-08-01 ENCOUNTER — Other Ambulatory Visit: Payer: Self-pay | Admitting: Internal Medicine

## 2016-08-01 DIAGNOSIS — N390 Urinary tract infection, site not specified: Secondary | ICD-10-CM

## 2016-08-01 MED ORDER — CIPROFLOXACIN HCL 250 MG PO TABS: ORAL_TABLET | ORAL | 0 refills | Status: AC

## 2016-08-09 DIAGNOSIS — Z961 Presence of intraocular lens: Secondary | ICD-10-CM | POA: Diagnosis not present

## 2016-09-01 ENCOUNTER — Ambulatory Visit (INDEPENDENT_AMBULATORY_CARE_PROVIDER_SITE_OTHER): Payer: Medicare Other | Admitting: *Deleted

## 2016-09-01 DIAGNOSIS — N390 Urinary tract infection, site not specified: Secondary | ICD-10-CM | POA: Diagnosis not present

## 2016-09-01 NOTE — Progress Notes (Signed)
Patient here for a NV to recheck UA and urine culture.  She states she has finished her antibiotic and is having no UTI symptoms.

## 2016-09-02 LAB — URINALYSIS, MICROSCOPIC ONLY
Bacteria, UA: NONE SEEN [HPF]
Casts: NONE SEEN [LPF]
Crystals: NONE SEEN [HPF]
YEAST: NONE SEEN [HPF]

## 2016-09-02 LAB — URINALYSIS, ROUTINE W REFLEX MICROSCOPIC
BILIRUBIN URINE: NEGATIVE
GLUCOSE, UA: NEGATIVE
HGB URINE DIPSTICK: NEGATIVE
KETONES UR: NEGATIVE
Leukocytes, UA: NEGATIVE
Nitrite: NEGATIVE
Specific Gravity, Urine: 1.017 (ref 1.001–1.035)
pH: 6 (ref 5.0–8.0)

## 2016-09-03 LAB — URINE CULTURE

## 2016-09-17 ENCOUNTER — Other Ambulatory Visit: Payer: Self-pay | Admitting: Internal Medicine

## 2016-11-02 ENCOUNTER — Ambulatory Visit: Payer: Self-pay | Admitting: Internal Medicine

## 2016-11-08 ENCOUNTER — Other Ambulatory Visit: Payer: Self-pay | Admitting: *Deleted

## 2016-11-08 DIAGNOSIS — Z1211 Encounter for screening for malignant neoplasm of colon: Secondary | ICD-10-CM

## 2016-11-08 DIAGNOSIS — Z0001 Encounter for general adult medical examination with abnormal findings: Secondary | ICD-10-CM

## 2016-11-08 LAB — POC HEMOCCULT BLD/STL (HOME/3-CARD/SCREEN)
Card #2 Fecal Occult Blod, POC: NEGATIVE
Card #3 Fecal Occult Blood, POC: NEGATIVE
FECAL OCCULT BLD: NEGATIVE

## 2017-02-02 ENCOUNTER — Ambulatory Visit: Payer: Self-pay | Admitting: Internal Medicine

## 2017-02-07 DIAGNOSIS — L570 Actinic keratosis: Secondary | ICD-10-CM | POA: Diagnosis not present

## 2017-03-27 ENCOUNTER — Other Ambulatory Visit: Payer: Self-pay | Admitting: Internal Medicine

## 2017-08-21 ENCOUNTER — Other Ambulatory Visit: Payer: Self-pay | Admitting: *Deleted

## 2017-08-21 MED ORDER — LEVOTHYROXINE SODIUM 50 MCG PO TABS: ORAL_TABLET | ORAL | 1 refills | Status: AC

## 2017-08-31 ENCOUNTER — Encounter: Payer: Self-pay | Admitting: Internal Medicine

## 2017-08-31 ENCOUNTER — Ambulatory Visit (INDEPENDENT_AMBULATORY_CARE_PROVIDER_SITE_OTHER): Payer: Medicare Other | Admitting: Internal Medicine

## 2017-08-31 VITALS — BP 118/78 | HR 68 | Temp 97.3°F | Resp 16 | Ht 62.75 in | Wt 100.0 lb

## 2017-08-31 DIAGNOSIS — Z1211 Encounter for screening for malignant neoplasm of colon: Secondary | ICD-10-CM

## 2017-08-31 DIAGNOSIS — R7303 Prediabetes: Secondary | ICD-10-CM

## 2017-08-31 DIAGNOSIS — Z0000 Encounter for general adult medical examination without abnormal findings: Secondary | ICD-10-CM | POA: Diagnosis not present

## 2017-08-31 DIAGNOSIS — Z136 Encounter for screening for cardiovascular disorders: Secondary | ICD-10-CM | POA: Diagnosis not present

## 2017-08-31 DIAGNOSIS — E039 Hypothyroidism, unspecified: Secondary | ICD-10-CM

## 2017-08-31 DIAGNOSIS — E559 Vitamin D deficiency, unspecified: Secondary | ICD-10-CM | POA: Diagnosis not present

## 2017-08-31 DIAGNOSIS — E782 Mixed hyperlipidemia: Secondary | ICD-10-CM | POA: Diagnosis not present

## 2017-08-31 DIAGNOSIS — Z23 Encounter for immunization: Secondary | ICD-10-CM | POA: Diagnosis not present

## 2017-08-31 DIAGNOSIS — I10 Essential (primary) hypertension: Secondary | ICD-10-CM | POA: Diagnosis not present

## 2017-08-31 DIAGNOSIS — Z79899 Other long term (current) drug therapy: Secondary | ICD-10-CM

## 2017-08-31 DIAGNOSIS — Z0001 Encounter for general adult medical examination with abnormal findings: Secondary | ICD-10-CM

## 2017-08-31 NOTE — Patient Instructions (Signed)

## 2017-08-31 NOTE — Progress Notes (Signed)
[facility] [name], M.D.     Uvaldo Bristle. Silverio Lay, P.A.-C Liane Comber, [place] [address] [place], [place] [address] Telephone [phone] Telefax [phone] Annual Screening/Preventative Visit & Comprehensive Evaluation &  Examination     This very nice 83 y.o. DWF presents for a Screening/Preventative Visit & comprehensive evaluation and management of multiple medical co-morbidities.  Patient has been followed for HTN,  Prediabetes, Hyperlipidemia and Vitamin D Deficiency.      HTN predates since 1979. Patient's BP has been controlled at home and patient denies any cardiac symptoms as chest pain, palpitations, shortness of breath, dizziness or ankle swelling. Today's BP is at goal - 118/78.      Patient's hyperlipidemia is controlled with diet. Last lipids were at goal: Lab Results  Component Value Date   CHOL 161 07/26/2016   HDL 99 07/26/2016   LDLCALC 46 07/26/2016   TRIG 82 07/26/2016   CHOLHDL 1.6 07/26/2016      Patient has prediabetes predating (A1c 5.9% in 2013) and patient denies reactive hypoglycemic symptoms, visual blurring, diabetic polys, or paresthesias. Last A1c was almost to gaol (<5.7%): Lab Results  Component Value Date   HGBA1C 5.7 (H) 07/26/2016     Patient has been on Thyroid Replacement since July 2014.  Finally, patient has history of Vitamin D Deficiency and last Vitamin D was still very low (goal 70-100): Lab Results  Component Value Date   VD25OH 41 07/26/2016   Current Outpatient Prescriptions on File Prior to Visit  Medication Sig  . BABY ASPIRIN PO Take  daily.  . Bilberry 1000 MG CAPS Take 1 caph daily.  . Calcium 600 -Vitamin D 800 u Take 1 tab 2 x daily.   Marland Kitchen levothyroxine 50 MCG tablet Take 1 tab  daily before breakfast  . Omega-3 FISH OIL 1000 MG  Take 1 cap daily.  . tamoxifen  20 MG tablet TAKE 1 TAB  EVERY DAY   . MAXZIDE-25    37.5-25 MG  TAKE 1  TAB  EVERY DAY    Allergies  Allergen Reactions  . Penicillins    Past Medical History:  Diagnosis Date  . History of breast cancer   . History of uterine cancer 1982  . Hyperlipidemia   . Hypertension   . Osteoporosis   . Prediabetes   . Vitamin D deficiency    Health Maintenance  Topic Date Due  . DEXA SCAN  05/14/1994  . PNA vac Low Risk Adult (2 of 2 - PPSV23) 07/05/2016  . INFLUENZA VACCINE  07/05/2017  . TETANUS/TDAP  06/25/2023   Immunization History  Administered Date(s) Administered  . Influenza Split 09/11/2014  . Influenza, High Dose Seasonal PF 07/26/2016  . Influenza-Unspecified 08/05/2014, 09/25/2015  . Pneumococcal Conjugate-13 07/06/2015  . Td 06/24/2013  . Zoster 12/06/2011   Past Surgical History:  Procedure Laterality Date  . BREAST SURGERY Right 2007   lumpectomy  . CATARACT EXTRACTION Right 2010  . COSMETIC SURGERY Bilateral 1979   Breast  . TONSILLECTOMY    . TOTAL ABDOMINAL HYSTERECTOMY W/ BILATERAL SALPINGOOPHORECTOMY     Family History  Problem Relation Age of Onset  . Cancer Mother        lung  . Hypertension Father    Social History  Substance Use Topics  . Smoking status: Never Smoker  . Smokeless tobacco: Never Used  . Alcohol use No    ROS Constitutional: Denies fever, chills, weight loss/gain,  headaches, insomnia,  night sweats, and change in appetite. Does c/o fatigue. Eyes: Denies redness, blurred vision, diplopia, discharge, itchy, watery eyes.  ENT: Denies discharge, congestion, post nasal drip, epistaxis, sore throat, earache, hearing loss, dental pain, Tinnitus, Vertigo, Sinus pain, snoring.  Cardio: Denies chest pain, palpitations, irregular heartbeat, syncope, dyspnea, diaphoresis, orthopnea, PND, claudication, edema Respiratory: denies cough, dyspnea, DOE, pleurisy, hoarseness, laryngitis, wheezing.  Gastrointestinal: Denies dysphagia, heartburn, reflux, water brash, pain, cramps, nausea, vomiting, bloating, diarrhea,  constipation, hematemesis, melena, hematochezia, jaundice, hemorrhoids Genitourinary: Denies dysuria, frequency, urgency, nocturia, hesitancy, discharge, hematuria, flank pain Breast: Breast lumps, nipple discharge, bleeding.  Musculoskeletal: Denies arthralgia, myalgia, stiffness, Jt. Swelling, pain, limp, and strain/sprain. Denies falls. Skin: Denies puritis, rash, hives, warts, acne, eczema, changing in skin lesion Neuro: No weakness, tremor, incoordination, spasms, paresthesia, pain Psychiatric: Denies confusion, memory loss, sensory loss. Denies Depression. Endocrine: Denies change in weight, skin, hair change, nocturia, and paresthesia, diabetic polys, visual blurring, hyper / hypo glycemic episodes.  Heme/Lymph: No excessive bleeding, bruising, enlarged lymph nodes.  Physical Exam  BP 118/78   Pulse 68   Temp (!) 97.3 F (36.3 C)   Resp 16   Ht 5' 2.75" (1.594 m)   Wt 100 lb (45.4 kg)   BMI 17.86 kg/m   General Appearance: Well nourished, well groomed and in no apparent distress.  Eyes: PERRLA, EOMs, conjunctiva no swelling or erythema, normal fundi and vessels. Sinuses: No frontal/maxillary tenderness ENT/Mouth: EACs patent / TMs  nl. Nares clear without erythema, swelling, mucoid exudates. Oral hygiene is good. No erythema, swelling, or exudate. Tongue normal, non-obstructing. Tonsils not swollen or erythematous. Hearing normal.  Neck: Supple, thyroid normal. No bruits, nodes or JVD. Respiratory: Respiratory effort normal.  BS equal and clear bilateral without rales, rhonci, wheezing or stridor. Cardio: Heart sounds are normal with regular rate and rhythm and no murmurs, rubs or gallops. Peripheral pulses are normal and equal bilaterally without edema. No aortic or femoral bruits. Chest: symmetric with normal excursions and percussion. Breasts: Implants very hardened bilaterally. No palpable abnormalities noted.  Abdomen: Flat, soft with bowel sounds active. Nontender, no  guarding, rebound, hernias, masses, or organomegaly.  Lymphatics: Non tender without lymphadenopathy.  Genitourinary:  Musculoskeletal: Full ROM all peripheral extremities, joint stability, 5/5 strength, and normal gait. Skin: Warm and dry without rashes, lesions, cyanosis, clubbing or  ecchymosis.  Neuro: Cranial nerves intact, reflexes equal bilaterally. Normal muscle tone, no cerebellar symptoms. Sensation intact.  Pysch: Alert and oriented X 3, normal affect, Insight and Judgment appropriate.   Assessment and Plan  1. Annual Preventative Screening Examination  2. Essential hypertension  - EKG 12-Lead - Urinalysis, Routine w reflex microscopic - Microalbumin / creatinine urine ratio - CBC with Differential/Platelet - BASIC METABOLIC PANEL WITH GFR - Magnesium - TSH  3. Hyperlipidemia, mixed  - EKG 12-Lead - Hepatic function panel - TSH  4. Prediabetes  - EKG 12-Lead - Hemoglobin A1c - Insulin, random  5. Vitamin D deficiency  - VITAMIN D 25 Hydroxy  6. Colon cancer screening  - POC Hemoccult Bld/Stl   7. Screening for ischemic heart disease  - EKG 12-Lead  8. Hypothyroidism   9. Need for prophylactic vaccination against pneumococcus  - Pneumococcal polysaccharide vaccine 23  10. Need for immunization against influenza  - Flu vaccine HIGH DOSE PF (Fluzone High dose)  11. Medication management  - Urinalysis, Routine w reflex microscopic - Microalbumin / creatinine urine ratio - CBC with Differential/Platelet - BASIC METABOLIC PANEL WITH GFR -  Hepatic function panel - Magnesium - TSH - Hemoglobin A1c - Insulin, random - VITAMIN D 25 Hydroxy         Patient was counseled in prudent diet to achieve/maintain BMI less than 25 for weight control, BP monitoring, regular exercise and medications. Discussed med's effects and SE's. Screening labs and tests as requested with regular follow-up as recommended. Over 40 minutes of exam, counseling, chart review  and high complex critical decision making was performed.

## 2017-09-01 LAB — HEPATIC FUNCTION PANEL
AG Ratio: 1.3 (calc) (ref 1.0–2.5)
ALBUMIN MSPROF: 4 g/dL (ref 3.6–5.1)
ALT: 15 U/L (ref 6–29)
AST: 40 U/L — AB (ref 10–35)
Alkaline phosphatase (APISO): 46 U/L (ref 33–130)
Bilirubin, Direct: 0.1 mg/dL (ref 0.0–0.2)
GLOBULIN: 3.1 g/dL (ref 1.9–3.7)
Indirect Bilirubin: 0.3 mg/dL (calc) (ref 0.2–1.2)
Total Bilirubin: 0.4 mg/dL (ref 0.2–1.2)
Total Protein: 7.1 g/dL (ref 6.1–8.1)

## 2017-09-01 LAB — MICROALBUMIN / CREATININE URINE RATIO
Creatinine, Urine: 158 mg/dL (ref 20–275)
Microalb Creat Ratio: 59 mcg/mg creat — ABNORMAL HIGH (ref <30)
Microalb, Ur: 9.3 mg/dL

## 2017-09-01 LAB — CBC WITH DIFFERENTIAL/PLATELET
BASOS ABS: 22 {cells}/uL (ref 0–200)
Basophils Relative: 0.4 %
EOS ABS: 73 {cells}/uL (ref 15–500)
EOS PCT: 1.3 %
HCT: 35 % (ref 35.0–45.0)
HEMOGLOBIN: 11.7 g/dL (ref 11.7–15.5)
Lymphs Abs: 991 cells/uL (ref 850–3900)
MCH: 30.3 pg (ref 27.0–33.0)
MCHC: 33.4 g/dL (ref 32.0–36.0)
MCV: 90.7 fL (ref 80.0–100.0)
MONOS PCT: 7.2 %
MPV: 10.8 fL (ref 7.5–12.5)
NEUTROS PCT: 73.4 %
Neutro Abs: 4110 cells/uL (ref 1500–7800)
Platelets: 149 10*3/uL (ref 140–400)
RBC: 3.86 10*6/uL (ref 3.80–5.10)
RDW: 12.6 % (ref 11.0–15.0)
Total Lymphocyte: 17.7 %
WBC mixed population: 403 cells/uL (ref 200–950)
WBC: 5.6 10*3/uL (ref 3.8–10.8)

## 2017-09-01 LAB — BASIC METABOLIC PANEL WITH GFR
BUN/Creatinine Ratio: 10 (calc) (ref 6–22)
BUN: 14 mg/dL (ref 7–25)
CALCIUM: 9.1 mg/dL (ref 8.6–10.4)
CHLORIDE: 101 mmol/L (ref 98–110)
CO2: 28 mmol/L (ref 20–32)
Creat: 1.4 mg/dL — ABNORMAL HIGH (ref 0.60–0.88)
GFR, EST NON AFRICAN AMERICAN: 33 mL/min/{1.73_m2} — AB (ref >=60)
GFR, Est African American: 39 mL/min/{1.73_m2} — ABNORMAL LOW (ref >=60)
Glucose, Bld: 107 mg/dL — ABNORMAL HIGH (ref 65–99)
POTASSIUM: 4.1 mmol/L (ref 3.5–5.3)
Sodium: 139 mmol/L (ref 135–146)

## 2017-09-01 LAB — URINALYSIS, ROUTINE W REFLEX MICROSCOPIC
BILIRUBIN URINE: NEGATIVE
GLUCOSE, UA: NEGATIVE
Hgb urine dipstick: NEGATIVE
KETONES UR: NEGATIVE
Leukocytes, UA: NEGATIVE
Nitrite: NEGATIVE
PH: 6 (ref 5.0–8.0)
Protein, ur: NEGATIVE
Specific Gravity, Urine: 1.013 (ref 1.001–1.03)

## 2017-09-01 LAB — HEMOGLOBIN A1C
Hgb A1c MFr Bld: 5.4 % of total Hgb (ref <5.7)
MEAN PLASMA GLUCOSE: 108 (calc)
eAG (mmol/L): 6 (calc)

## 2017-09-01 LAB — INSULIN, RANDOM: INSULIN: 4.4 u[IU]/mL (ref 2.0–19.6)

## 2017-09-01 LAB — TSH: TSH: 4.63 mIU/L — ABNORMAL HIGH (ref 0.40–4.50)

## 2017-09-01 LAB — MAGNESIUM: MAGNESIUM: 1.8 mg/dL (ref 1.5–2.5)

## 2017-09-01 LAB — VITAMIN D 25 HYDROXY (VIT D DEFICIENCY, FRACTURES): VIT D 25 HYDROXY: 38 ng/mL (ref 30–100)

## 2017-09-04 ENCOUNTER — Encounter: Payer: Self-pay | Admitting: *Deleted

## 2017-09-18 ENCOUNTER — Emergency Department (HOSPITAL_COMMUNITY): Payer: Medicare Other

## 2017-09-18 ENCOUNTER — Inpatient Hospital Stay (HOSPITAL_COMMUNITY)
Admission: EM | Admit: 2017-09-18 | Discharge: 2017-09-22 | DRG: 310 | Disposition: A | Discharge status: Home Health | Payer: Medicare Other | Attending: Internal Medicine | Admitting: Internal Medicine

## 2017-09-18 ENCOUNTER — Encounter (HOSPITAL_COMMUNITY): Payer: Self-pay | Admitting: Emergency Medicine

## 2017-09-18 DIAGNOSIS — I4891 Unspecified atrial fibrillation: Secondary | ICD-10-CM

## 2017-09-18 DIAGNOSIS — Z79899 Other long term (current) drug therapy: Secondary | ICD-10-CM

## 2017-09-18 DIAGNOSIS — R55 Syncope and collapse: Secondary | ICD-10-CM | POA: Diagnosis present

## 2017-09-18 DIAGNOSIS — D696 Thrombocytopenia, unspecified: Secondary | ICD-10-CM | POA: Diagnosis present

## 2017-09-18 DIAGNOSIS — Z853 Personal history of malignant neoplasm of breast: Secondary | ICD-10-CM | POA: Diagnosis not present

## 2017-09-18 DIAGNOSIS — R531 Weakness: Secondary | ICD-10-CM | POA: Diagnosis not present

## 2017-09-18 DIAGNOSIS — S0990XA Unspecified injury of head, initial encounter: Secondary | ICD-10-CM | POA: Diagnosis not present

## 2017-09-18 DIAGNOSIS — Z8542 Personal history of malignant neoplasm of other parts of uterus: Secondary | ICD-10-CM

## 2017-09-18 DIAGNOSIS — R7303 Prediabetes: Secondary | ICD-10-CM | POA: Diagnosis present

## 2017-09-18 DIAGNOSIS — Z8673 Personal history of transient ischemic attack (TIA), and cerebral infarction without residual deficits: Secondary | ICD-10-CM | POA: Diagnosis not present

## 2017-09-18 DIAGNOSIS — I10 Essential (primary) hypertension: Secondary | ICD-10-CM | POA: Diagnosis not present

## 2017-09-18 DIAGNOSIS — E785 Hyperlipidemia, unspecified: Secondary | ICD-10-CM | POA: Diagnosis not present

## 2017-09-18 DIAGNOSIS — I480 Paroxysmal atrial fibrillation: Secondary | ICD-10-CM | POA: Diagnosis not present

## 2017-09-18 DIAGNOSIS — D649 Anemia, unspecified: Secondary | ICD-10-CM | POA: Diagnosis present

## 2017-09-18 DIAGNOSIS — Z79810 Long term (current) use of selective estrogen receptor modulators (SERMs): Secondary | ICD-10-CM | POA: Diagnosis not present

## 2017-09-18 DIAGNOSIS — E039 Hypothyroidism, unspecified: Secondary | ICD-10-CM | POA: Diagnosis not present

## 2017-09-18 DIAGNOSIS — Y92481 Parking lot as the place of occurrence of the external cause: Secondary | ICD-10-CM

## 2017-09-18 DIAGNOSIS — R404 Transient alteration of awareness: Secondary | ICD-10-CM | POA: Diagnosis not present

## 2017-09-18 DIAGNOSIS — M810 Age-related osteoporosis without current pathological fracture: Secondary | ICD-10-CM | POA: Diagnosis present

## 2017-09-18 DIAGNOSIS — Z888 Allergy status to other drugs, medicaments and biological substances status: Secondary | ICD-10-CM | POA: Diagnosis not present

## 2017-09-18 DIAGNOSIS — W010XXA Fall on same level from slipping, tripping and stumbling without subsequent striking against object, initial encounter: Secondary | ICD-10-CM | POA: Diagnosis present

## 2017-09-18 LAB — CBC WITH DIFFERENTIAL/PLATELET
Basophils Absolute: 0 10*3/uL (ref 0.0–0.1)
Basophils Relative: 0 %
Eosinophils Absolute: 0 10*3/uL (ref 0.0–0.7)
Eosinophils Relative: 0 %
HCT: 34.6 % — ABNORMAL LOW (ref 36.0–46.0)
Hemoglobin: 11.3 g/dL — ABNORMAL LOW (ref 12.0–15.0)
Lymphocytes Relative: 11 %
Lymphs Abs: 0.7 10*3/uL (ref 0.7–4.0)
MCH: 29.5 pg (ref 26.0–34.0)
MCHC: 32.7 g/dL (ref 30.0–36.0)
MCV: 90.3 fL (ref 78.0–100.0)
Monocytes Absolute: 0.4 10*3/uL (ref 0.1–1.0)
Monocytes Relative: 6 %
Neutro Abs: 5.5 10*3/uL (ref 1.7–7.7)
Neutrophils Relative %: 83 %
Platelets: 127 10*3/uL — ABNORMAL LOW (ref 150–400)
RBC: 3.83 MIL/uL — ABNORMAL LOW (ref 3.87–5.11)
RDW: 13.7 % (ref 11.5–15.5)
WBC: 6.7 10*3/uL (ref 4.0–10.5)

## 2017-09-18 LAB — URINALYSIS, ROUTINE W REFLEX MICROSCOPIC
Bilirubin Urine: NEGATIVE
Glucose, UA: NEGATIVE mg/dL
Hgb urine dipstick: NEGATIVE
Ketones, ur: NEGATIVE mg/dL
Leukocytes, UA: NEGATIVE
Nitrite: NEGATIVE
Protein, ur: 100 mg/dL — AB
Specific Gravity, Urine: 1.01 (ref 1.005–1.030)
pH: 7 (ref 5.0–8.0)

## 2017-09-18 LAB — COMPREHENSIVE METABOLIC PANEL
ALT: 9 U/L — ABNORMAL LOW (ref 14–54)
AST: 19 U/L (ref 15–41)
Albumin: 3.7 g/dL (ref 3.5–5.0)
Alkaline Phosphatase: 44 U/L (ref 38–126)
Anion gap: 10 (ref 5–15)
BUN: 17 mg/dL (ref 6–20)
CO2: 25 mmol/L (ref 22–32)
Calcium: 8.9 mg/dL (ref 8.9–10.3)
Chloride: 98 mmol/L — ABNORMAL LOW (ref 101–111)
Creatinine, Ser: 1.56 mg/dL — ABNORMAL HIGH (ref 0.44–1.00)
GFR calc Af Amer: 33 mL/min — ABNORMAL LOW (ref >60)
GFR calc non Af Amer: 29 mL/min — ABNORMAL LOW (ref >60)
Glucose, Bld: 119 mg/dL — ABNORMAL HIGH (ref 65–99)
Potassium: 3.7 mmol/L (ref 3.5–5.1)
Sodium: 133 mmol/L — ABNORMAL LOW (ref 135–145)
Total Bilirubin: 0.5 mg/dL (ref 0.3–1.2)
Total Protein: 6.6 g/dL (ref 6.5–8.1)

## 2017-09-18 LAB — I-STAT CG4 LACTIC ACID, ED: Lactic Acid, Venous: 1 mmol/L (ref 0.5–1.9)

## 2017-09-18 LAB — TROPONIN I: Troponin I: <0.03 ng/mL (ref <0.03)

## 2017-09-18 NOTE — ED Triage Notes (Signed)
Per [facility], Pt had a fall today in the parking lot. Pt denies LOC, hitting head. Pt denies pain, no obvious injury noted. Pt states she think she just sat down, but does not remember. Pt reports not eating as well today and feeling very weak. Pt has some short term memory problems per EMS. Pt alert and oriented x4, in no acute distress.

## 2017-09-18 NOTE — ED Provider Notes (Signed)
Pt presents after a probable syncopal episode.  Pt denies any chest pain or shortness of breath.  EKG with st elevation anteriorly, suspect this is related to LVH.  Doubt acute cardiac ischemia.  Pt denies any chest pain or shortness of breath right now.  7:27 PM   Will check labs, cardiac enzymes, further evaluation as to why she had a syncopal episode.  Continue to monitor.  Medical screening examination/treatment/procedure(s) were conducted as a shared visit with non-physician practitioner(s) and myself.  I personally evaluated the patient during the encounter.   EKG Interpretation  Date/Time:  Monday September 18 2017 19:12:37 EDT Ventricular Rate:  89 PR Interval:    QRS Duration: 115 QT Interval:  406 QTC Calculation: 494 R Axis:   22 Text Interpretation:  Sinus rhythm Borderline prolonged PR interval LVH with secondary repolarization abnormality Anterior ST elevation, likely related to LVH Baseline wander in lead(s) V5 no prior tracing Confirmed by Dorie Rank [phone]) on 09/18/2017 7:18:52 PM           Dorie Rank, MD 09/18/17 1928

## 2017-09-18 NOTE — ED Notes (Signed)
Pt returned from CT °

## 2017-09-18 NOTE — ED Notes (Signed)
Pt to CT via stretcher on CCM

## 2017-09-19 ENCOUNTER — Encounter (HOSPITAL_COMMUNITY): Payer: Self-pay | Admitting: Internal Medicine

## 2017-09-19 ENCOUNTER — Observation Stay (HOSPITAL_COMMUNITY): Payer: Medicare Other

## 2017-09-19 ENCOUNTER — Emergency Department (HOSPITAL_COMMUNITY): Payer: Medicare Other

## 2017-09-19 DIAGNOSIS — R55 Syncope and collapse: Secondary | ICD-10-CM | POA: Diagnosis not present

## 2017-09-19 DIAGNOSIS — S0990XA Unspecified injury of head, initial encounter: Secondary | ICD-10-CM | POA: Diagnosis not present

## 2017-09-19 DIAGNOSIS — R531 Weakness: Secondary | ICD-10-CM

## 2017-09-19 DIAGNOSIS — R7303 Prediabetes: Secondary | ICD-10-CM

## 2017-09-19 DIAGNOSIS — Z8542 Personal history of malignant neoplasm of other parts of uterus: Secondary | ICD-10-CM

## 2017-09-19 LAB — COMPREHENSIVE METABOLIC PANEL
ALK PHOS: 45 U/L (ref 38–126)
ALT: 10 U/L — AB (ref 14–54)
AST: 19 U/L (ref 15–41)
Albumin: 3.3 g/dL — ABNORMAL LOW (ref 3.5–5.0)
Anion gap: 8 (ref 5–15)
BUN: 18 mg/dL (ref 6–20)
CALCIUM: 8.6 mg/dL — AB (ref 8.9–10.3)
CO2: 26 mmol/L (ref 22–32)
CREATININE: 1.38 mg/dL — AB (ref 0.44–1.00)
Chloride: 103 mmol/L (ref 101–111)
GFR calc non Af Amer: 33 mL/min — ABNORMAL LOW (ref >60)
GFR, EST AFRICAN AMERICAN: 38 mL/min — AB (ref >60)
GLUCOSE: 98 mg/dL (ref 65–99)
Potassium: 3.7 mmol/L (ref 3.5–5.1)
SODIUM: 137 mmol/L (ref 135–145)
Total Bilirubin: 0.4 mg/dL (ref 0.3–1.2)
Total Protein: 6 g/dL — ABNORMAL LOW (ref 6.5–8.1)

## 2017-09-19 LAB — CBC WITH DIFFERENTIAL/PLATELET
Basophils Absolute: 0 10*3/uL (ref 0.0–0.1)
Basophils Relative: 0 %
EOS ABS: 0.1 10*3/uL (ref 0.0–0.7)
Eosinophils Relative: 1 %
HCT: 32.5 % — ABNORMAL LOW (ref 36.0–46.0)
HEMOGLOBIN: 10.7 g/dL — AB (ref 12.0–15.0)
LYMPHS ABS: 1.2 10*3/uL (ref 0.7–4.0)
LYMPHS PCT: 21 %
MCH: 30 pg (ref 26.0–34.0)
MCHC: 32.9 g/dL (ref 30.0–36.0)
MCV: 91 fL (ref 78.0–100.0)
Monocytes Absolute: 0.5 10*3/uL (ref 0.1–1.0)
Monocytes Relative: 9 %
NEUTROS ABS: 3.9 10*3/uL (ref 1.7–7.7)
NEUTROS PCT: 69 %
Platelets: 131 10*3/uL — ABNORMAL LOW (ref 150–400)
RBC: 3.57 MIL/uL — AB (ref 3.87–5.11)
RDW: 13.9 % (ref 11.5–15.5)
WBC: 5.7 10*3/uL (ref 4.0–10.5)

## 2017-09-19 LAB — TROPONIN I
Troponin I: <0.03 ng/mL (ref <0.03)
Troponin I: <0.03 ng/mL (ref <0.03)
Troponin I: <0.03 ng/mL (ref <0.03)

## 2017-09-19 LAB — MAGNESIUM: Magnesium: 1.7 mg/dL (ref 1.7–2.4)

## 2017-09-19 LAB — PHOSPHORUS: PHOSPHORUS: 2.8 mg/dL (ref 2.5–4.6)

## 2017-09-19 MED ORDER — HYDRALAZINE HCL 20 MG/ML IJ SOLN
10.0000 mg | INTRAMUSCULAR | Status: DC | PRN
  Administered 2017-09-20 – 2017-09-21 (×2): 10 mg via INTRAVENOUS
  Filled 2017-09-19 (×2): qty 1

## 2017-09-19 MED ORDER — SODIUM CHLORIDE 0.9 % IV SOLN
INTRAVENOUS | Status: DC
Start: 2017-09-19 — End: 2017-09-20
  Administered 2017-09-19 (×2): via INTRAVENOUS

## 2017-09-19 MED ORDER — LEVOTHYROXINE SODIUM 25 MCG PO TABS
25.0000 ug | ORAL_TABLET | Freq: Every day | ORAL | Status: DC
  Administered 2017-09-19 – 2017-09-22 (×4): 25 ug via ORAL
  Filled 2017-09-19 (×6): qty 1

## 2017-09-19 MED ORDER — STROKE: EARLY STAGES OF RECOVERY BOOK
Freq: Once | Status: AC
  Administered 2017-09-19: 13:00:00
  Filled 2017-09-19 (×2): qty 1

## 2017-09-19 MED ORDER — ACETAMINOPHEN 650 MG RE SUPP: 650.0000 mg | RECTAL | Status: DC | PRN

## 2017-09-19 MED ORDER — ACETAMINOPHEN 325 MG PO TABS: 650.0000 mg | ORAL_TABLET | ORAL | Status: DC | PRN

## 2017-09-19 MED ORDER — ACETAMINOPHEN 160 MG/5ML PO SOLN: 650.0000 mg | ORAL | Status: DC | PRN

## 2017-09-19 NOTE — ED Provider Notes (Signed)
[facility] EMERGENCY DEPARTMENT Provider Note   CSN: [id number] Arrival date & time: 09/18/17  1854     History   Chief Complaint Chief Complaint  Patient presents with  . Fall  . Weakness    HPI Brooke Conley is a 83 y.o. female.  HPI Patient presents to the emergency department with what appears to be a syncopal episode.  The patient was unable to give me a very clear history as to what happened.  She states that she was walking out of a store and felt weak, but does not recall what occurred.  Patient is very confused about some of the details throughout her day and about the event.  She states that she is unsure if she was leaving, or coming into the store. The patient denies chest pain, shortness of breath, headache,blurred vision, neck pain, fever, cough, weakness, numbness, dizziness, anorexia, edema, abdominal pain, nausea, vomiting, diarrhea, rash, back pain, dysuria.  Past Medical History:  Diagnosis Date  . History of breast cancer   . History of uterine cancer 1982  . Hyperlipidemia   . Hypertension   . Osteoporosis   . Prediabetes   . Vitamin D deficiency     Patient Active Problem List   Diagnosis Date Noted  . Syncope 09/19/2017  . Encounter for general adult medical examination with abnormal findings 07/26/2016  . Hypothyroid 07/06/2015  . Medicare annual wellness visit, initial 07/06/2015  . Medication management 06/26/2014  . Prediabetes   . Hypertension   . Hyperlipidemia   . Osteoporosis   . History of breast cancer   . Vitamin D deficiency   . History of uterine cancer     Past Surgical History:  Procedure Laterality Date  . BREAST SURGERY Right 2007   lumpectomy  . CATARACT EXTRACTION Right 2010  . COSMETIC SURGERY Bilateral 1979   Breast  . TONSILLECTOMY    . TOTAL ABDOMINAL HYSTERECTOMY W/ BILATERAL SALPINGOOPHORECTOMY      OB History    No data available       Home Medications    Prior to Admission  medications   Medication Sig Start Date End Date Taking? Authorizing Provider  BABY ASPIRIN PO Take 81 mg by mouth daily.   Yes [provider]  Bilberry 1000 MG CAPS Take 1 capsule by mouth daily.   Yes [provider]  Biotin 10 MG CAPS Take 10 mg by mouth daily.   Yes [provider]  Calcium Citrate-Vitamin D (CALCIUM + D PO) Take 1 tablet by mouth 2 (two) times daily. Takes calcium 600 mg and vitamin D 800 units BID   Yes [provider]  levothyroxine (SYNTHROID, LEVOTHROID) 50 MCG tablet Take 1 tablet by mouth  daily before breakfast Patient taking differently: 0.25 mcg. Take 1 tablet by mouth  daily before breakfast 08/21/17  Yes [name], MD  Omega-3 Fatty Acids (FISH OIL) 1000 MG CAPS Take 1 capsule by mouth daily.   Yes [provider]  tamoxifen (NOLVADEX) 20 MG tablet TAKE 1 TABLET BY MOUTH  EVERY DAY FOR OSTEOPOROSIS Patient taking differently: TAKE 20 mg TABLET BY MOUTH  EVERY DAY FOR OSTEOPOROSIS 09/17/16  Yes [name], MD  triamterene-hydrochlorothiazide (MAXZIDE-25) 37.5-25 MG tablet TAKE 1 TABLET BY MOUTH  EVERY DAY FOR BLOOD  PRESSURE AND FLUID. 09/17/16  Yes [name], MD    Family History Family History  Problem Relation Age of Onset  . Cancer Mother  lung  . Hypertension Father     Social History Social History  Substance Use Topics  . Smoking status: Never Smoker  . Smokeless tobacco: Never Used  . Alcohol use No     Allergies   Penicillins   Review of Systems Review of Systems Level V caveat applies due to confusion  Physical Exam Updated Vital Signs BP (!) 168/70   Pulse 73   Resp 20   SpO2 100%   Physical Exam  Constitutional: She is oriented to person, place, and time. She appears well-developed and well-nourished. No distress.  HENT:  Head: Normocephalic and atraumatic.  Mouth/Throat: Oropharynx is clear and moist.  Eyes: Pupils are equal, round, and reactive to  light.  Neck: Normal range of motion. Neck supple.  Cardiovascular: Normal rate, regular rhythm and normal heart sounds.  Exam reveals no gallop and no friction rub.   No murmur heard. Pulmonary/Chest: Effort normal and breath sounds normal. No respiratory distress. She has no wheezes.  Abdominal: Soft. Bowel sounds are normal. She exhibits no distension. There is no tenderness.  Neurological: She is alert and oriented to person, place, and time. She exhibits normal muscle tone. Coordination normal.  Skin: Skin is warm and dry. Capillary refill takes less than 2 seconds. No rash noted. No erythema.  Psychiatric: She has a normal mood and affect. Her behavior is normal.  Nursing note and vitals reviewed.    ED Treatments / Results  Labs (all labs ordered are listed, but only abnormal results are displayed) Labs Reviewed  COMPREHENSIVE METABOLIC PANEL - Abnormal; Notable for the following:       Result Value   Sodium 133 (*)    Chloride 98 (*)    Glucose, Bld 119 (*)    Creatinine, Ser 1.56 (*)    ALT 9 (*)    GFR calc non Af Amer 29 (*)    GFR calc Af Amer 33 (*)    All other components within normal limits  CBC WITH DIFFERENTIAL/PLATELET - Abnormal; Notable for the following:    RBC 3.83 (*)    Hemoglobin 11.3 (*)    HCT 34.6 (*)    Platelets 127 (*)    All other components within normal limits  URINALYSIS, ROUTINE W REFLEX MICROSCOPIC - Abnormal; Notable for the following:    Protein, ur 100 (*)    Bacteria, UA RARE (*)    Squamous Epithelial / LPF 0-5 (*)    All other components within normal limits  TROPONIN I  I-STAT CG4 LACTIC ACID, ED    EKG  EKG Interpretation  Date/Time:  Monday September 18 2017 19:12:37 EDT Ventricular Rate:  89 PR Interval:    QRS Duration: 115 QT Interval:  406 QTC Calculation: 494 R Axis:   22 Text Interpretation:  Sinus rhythm Borderline prolonged PR interval LVH with secondary repolarization abnormality Anterior ST elevation, likely  related to LVH Baseline wander in lead(s) V5 no prior tracing Confirmed by Dorie Rank [phone]) on 09/18/2017 7:18:52 PM       Radiology Dg Chest 2 View  Result Date: 09/18/2017 CLINICAL DATA:  83 y/o  F; syncope. EXAM: CHEST  2 VIEW COMPARISON:  08/15/2008 chest CT. FINDINGS: Normal cardiac silhouette given projection and technique. Aortic atherosclerosis with calcification. No pneumothorax or pleural effusion. Clear lungs. No acute osseous abnormality identified. Bilateral breast prostheses. IMPRESSION: No active cardiopulmonary disease.  Aortic atherosclerosis. Electronically Signed   By: Kristine Garbe M.D.   On: 09/18/2017 21:54  Ct Head Wo Contrast  Result Date: 09/18/2017 CLINICAL DATA:  Altered mental status EXAM: CT HEAD WITHOUT CONTRAST TECHNIQUE: Contiguous axial images were obtained from the base of the skull through the vertex without intravenous contrast. COMPARISON:  None. FINDINGS: Brain: No large vessel territorial infarction or hemorrhage is visualized. Moderate atrophy. Mild small vessel ischemic changes of the white matter. Ventricle size within normal limits. Questionable asymmetric hypodensity within the right cerebellar white matter. Vascular: No hyperdense vessels. Calcifications at the carotid siphons. Skull: No fracture Sinuses/Orbits: No acute finding. Other: None IMPRESSION: 1. Questionable asymmetric hypodensity/edema within the right cerebellar white matter, could further evaluate with MRI. Negative for hemorrhage. 2. Atrophy and mild small vessel ischemic changes of the white matter. Electronically Signed   By: Donavan Foil M.D.   On: 09/18/2017 22:02    Procedures Procedures (including critical care time)  Medications Ordered in ED Medications - No data to display   Initial Impression / Assessment and Plan / ED Course  I have reviewed the triage vital signs and the nursing notes.  Pertinent labs & imaging results that were available during my care  of the patient were reviewed by me and considered in my medical decision making (see chart for details).    I feel that the patient most likely had a syncopal episode. Patient is very confused and I am not sure if this is her baseline or if this is a new feature  Patient will be admitted to the hospital for further evaluation.  I advised the son and the patient of the need for admission   Final Clinical Impressions(s) / ED Diagnoses   Final diagnoses:  Syncope and collapse    New Prescriptions New Prescriptions   No medications on file     Dalia Heading, Hershal Coria 09/20/17 Warden Fillers, MD 09/20/17 [phone]

## 2017-09-19 NOTE — Procedures (Signed)
ELECTROENCEPHALOGRAM REPORT  Date of Study: 09/19/2017  Patient's Name: Brooke [facility] MRN: [id number] Date of Birth: Sep 22, 1927  Referring Provider: Dr. Gean Birchwood  Clinical History: This is an 83 year old woman with near syncope.  Medications: acetaminophen (TYLENOL) tablet 650 mg  hydrALAZINE (APRESOLINE) injection 10 mg  levothyroxine (SYNTHROID, LEVOTHROID) tablet 25 mcg   Technical Summary: A multichannel digital EEG recording measured by the international 10-20 system with electrodes applied with paste and impedances below 5000 ohms performed in our laboratory with EKG monitoring in an awake and drowsy patient.  Hyperventilation and photic stimulation were not performed.  The digital EEG was referentially recorded, reformatted, and digitally filtered in a variety of bipolar and referential montages for optimal display.    Description: The patient is awake and drowsy during the recording.  During maximal wakefulness, there is a symmetric, low voltage 10-11 Hz posterior dominant rhythm that attenuates with eye opening.  The record is symmetric.  During drowsiness, there is an increase in theta slowing of the background.  Deeper stages of sleep were not seen. Hyperventilation and photic stimulation were not performed. There were no epileptiform discharges or electrographic seizures seen.    EKG lead was unremarkable.  Impression: This awake and drowsy EEG is normal.    Clinical Correlation: A normal EEG does not exclude a clinical diagnosis of epilepsy. Clinical correlation is advised.   Ellouise Newer, M.D.

## 2017-09-19 NOTE — ED Notes (Signed)
Pt back from MRI 

## 2017-09-19 NOTE — ED Notes (Addendum)
Pt ambulatory to restroom tolerated well

## 2017-09-19 NOTE — ED Notes (Signed)
Patient transported to MRI 

## 2017-09-19 NOTE — ED Notes (Signed)
Son wants to be present when admitting is at bedside

## 2017-09-19 NOTE — Progress Notes (Signed)
EEG completed, results pending. 

## 2017-09-19 NOTE — Progress Notes (Signed)
The patient was admitted early this AM after midnight and H and P has been reviewed and I am in agreement with the current Assessment and Plan done by Dr. Hal Hope. The patient is an 83 year old Caucasian female with a PMH of HTN, Hypothyroidism, Breast and Uterine Cancer in Remission, Osteoporosis, Pre-Diabetes and other comorbids who presented to [facility] ED with a cc of Weakness. She was out shopping and went back to her car and started feeling weak and sat down on the pavement and per patient did not pass out but does not recall. EMS was called and she was brought to the ER for evaluation. Head CT was done and showed non-specific findings in the Right Cerebellum so MRI was ordered and showed no acute intracranial abnormalities identified, a small remote Right cerebellar infarct, and mild age-related cerebral atrophy. Patient under went EEG testing which showed a normal awake and drowsy EEG. ECHOCardiogram ordered and pending as well as PT/OT. Will continue with gentle IVF Rehydration with NS at 50 mL/hr. Repeat labs this AM showed improvement. Will follow on ECHOCardiogram and PT/OT Recc's.

## 2017-09-19 NOTE — ED Notes (Signed)
Contact Information Josph Macho (Son) [phone]

## 2017-09-19 NOTE — ED Notes (Signed)
Pt talking on phone with Son. Denies complaint.

## 2017-09-19 NOTE — H&P (Addendum)
History and Physical    Brooke Conley GBT:[id number] DOB: 28-Mar-1927 DOA: 09/18/2017  PCP: [name], MD  Patient coming from: home.  Chief Complaint: weakness.  HPI: Brooke Conley is a 83 y.o. female with history of hypertension, hypothyroidism breast cancer and uterine cancer in remission was brought to the ER after patient was feeling weak. Patient states she was shopping and on the way back to the car park patient started feeling weak and had to sit on the pavement. Patient states she did not pass out.There was some discrepancy on this. The store people had to call EMS and patient was brought to the ER.patient denies any chest pain shortness of breath nausea vomiting or diarrhea. No change in her medications.   ED Course: in the ER patient appeared nonfocal. CT of the head done showed nonspecific findings in the right cerebellum. MRI of the brain has been ordered. EKG was showing ST-T changes probably from LVH. Troponin was negative. Chest x-ray unremarkable. Patient admitted for further observation for possible workup for weakness/near syncope.  Review of Systems: As per HPI, rest all negative.   Past Medical History:  Diagnosis Date  . History of breast cancer   . History of uterine cancer 1982  . Hyperlipidemia   . Hypertension   . Osteoporosis   . Prediabetes   . Vitamin D deficiency     Past Surgical History:  Procedure Laterality Date  . BREAST SURGERY Right 2007   lumpectomy  . CATARACT EXTRACTION Right 2010  . COSMETIC SURGERY Bilateral 1979   Breast  . TONSILLECTOMY    . TOTAL ABDOMINAL HYSTERECTOMY W/ BILATERAL SALPINGOOPHORECTOMY       reports that she has never smoked. She has never used smokeless tobacco. She reports that she does not drink alcohol or use drugs.  Allergies  Allergen Reactions  . Penicillins Rash    Has patient had a PCN reaction causing immediate rash, facial/tongue/throat swelling, SOB or lightheadedness with hypotension:  No Has patient had a PCN reaction causing severe rash involving mucus membranes or skin necrosis: No Has patient had a PCN reaction that required hospitalization: No Has patient had a PCN reaction occurring within the last 10 years: No If all of the above answers are "NO", then may proceed with Cephalosporin use.    Family History  Problem Relation Age of Onset  . Cancer Mother        lung  . Hypertension Father     Prior to Admission medications   Medication Sig Start Date End Date Taking? Authorizing Provider  BABY ASPIRIN PO Take 81 mg by mouth daily.   Yes [provider]  Bilberry 1000 MG CAPS Take 1 capsule by mouth daily.   Yes [provider]  Biotin 10 MG CAPS Take 10 mg by mouth daily.   Yes [provider]  Calcium Citrate-Vitamin D (CALCIUM + D PO) Take 1 tablet by mouth 2 (two) times daily. Takes calcium 600 mg and vitamin D 800 units BID   Yes [provider]  levothyroxine (SYNTHROID, LEVOTHROID) 50 MCG tablet Take 1 tablet by mouth  daily before breakfast Patient taking differently: 0.25 mcg. Take 1 tablet by mouth  daily before breakfast 08/21/17  Yes [name], MD  Omega-3 Fatty Acids (FISH OIL) 1000 MG CAPS Take 1 capsule by mouth daily.   Yes [provider]  tamoxifen (NOLVADEX) 20 MG tablet TAKE 1 TABLET BY MOUTH  EVERY DAY FOR OSTEOPOROSIS Patient taking differently:  TAKE 20 mg TABLET BY MOUTH  EVERY DAY FOR OSTEOPOROSIS 09/17/16  Yes [name], MD  triamterene-hydrochlorothiazide [facility]) 37.5-25 MG tablet TAKE 1 TABLET BY MOUTH  EVERY DAY FOR BLOOD  PRESSURE AND FLUID. 09/17/16  Yes [name], MD    Physical Exam: Vitals:   09/18/17 2330 09/18/17 2345 09/19/17 0000 09/19/17 0015  BP: (!) 156/62 (!) 148/68 (!) 144/66 (!) 168/70  Pulse: 74 71 71 73  Resp:      SpO2: 100% 99% 95% 100%      Constitutional: moderately built and nourished. Vitals:   09/18/17 2330 09/18/17 2345 09/19/17  0000 09/19/17 0015  BP: (!) 156/62 (!) 148/68 (!) 144/66 (!) 168/70  Pulse: 74 71 71 73  Resp:      SpO2: 100% 99% 95% 100%   Eyes: anicteric no pallor. ENMT: no discharge from the ears eyes nose and mouth. Neck: no mass felt. No neck rigidity. No JVD appreciated. Respiratory: no rhonchi or crepitations. Cardiovascular: S1-S2 heard no murmurs appreciated. Abdomen: soft nontender bowel sounds present. Musculoskeletal: no edema. No joint effusion. Skin: no rash. Skin appears warm. Neurologic:alert awake oriented to time place and person. Moves all extremities. Psychiatric: appears normal. Normal affect.   Labs on Admission: I have personally reviewed following labs and imaging studies  CBC:  Recent Labs Lab 09/18/17 1959  WBC 6.7  NEUTROABS 5.5  HGB 11.3*  HCT 34.6*  MCV 90.3  PLT 765*   Basic Metabolic Panel:  Recent Labs Lab 09/18/17 1959  NA 133*  K 3.7  CL 98*  CO2 25  GLUCOSE 119*  BUN 17  CREATININE 1.56*  CALCIUM 8.9   GFR: CrCl cannot be calculated (Unknown ideal weight.). Liver Function Tests:  Recent Labs Lab 09/18/17 1959  AST 19  ALT 9*  ALKPHOS 44  BILITOT 0.5  PROT 6.6  ALBUMIN 3.7   No results for input(s): LIPASE, AMYLASE in the last 168 hours. No results for input(s): AMMONIA in the last 168 hours. Coagulation Profile: No results for input(s): INR, PROTIME in the last 168 hours. Cardiac Enzymes:  Recent Labs Lab 09/18/17 1959  TROPONINI <0.03   BNP (last 3 results) No results for input(s): PROBNP in the last 8760 hours. HbA1C: No results for input(s): HGBA1C in the last 72 hours. CBG: No results for input(s): GLUCAP in the last 168 hours. Lipid Profile: No results for input(s): CHOL, HDL, LDLCALC, TRIG, CHOLHDL, LDLDIRECT in the last 72 hours. Thyroid Function Tests: No results for input(s): TSH, T4TOTAL, FREET4, T3FREE, THYROIDAB in the last 72 hours. Anemia Panel: No results for input(s): VITAMINB12, FOLATE, FERRITIN,  TIBC, IRON, RETICCTPCT in the last 72 hours. Urine analysis:    Component Value Date/Time   COLORURINE YELLOW 09/18/2017 2115   APPEARANCEUR CLEAR 09/18/2017 2115   LABSPEC 1.010 09/18/2017 2115   LABSPEC 1.010 05/09/2006 1305   PHURINE 7.0 09/18/2017 2115   GLUCOSEU NEGATIVE 09/18/2017 2115   HGBUR NEGATIVE 09/18/2017 2115   BILIRUBINUR NEGATIVE 09/18/2017 2115   BILIRUBINUR Negative 05/09/2006 [place] 09/18/2017 2115   PROTEINUR 100 (A) 09/18/2017 2115   UROBILINOGEN 0.2 09/11/2014 1527   NITRITE NEGATIVE 09/18/2017 2115   LEUKOCYTESUR NEGATIVE 09/18/2017 2115   LEUKOCYTESUR Negative 05/09/2006 1305   Sepsis Labs: @LABRCNTIP (procalcitonin:4,lacticidven:4) )No results found for this or any previous visit (from the past 240 hour(s)).   Radiological Exams on Admission: Dg Chest 2 View  Result Date: 09/18/2017 CLINICAL DATA:  83 y/o  F; syncope. EXAM: CHEST  2 VIEW COMPARISON:  08/15/2008 chest CT. FINDINGS: Normal cardiac silhouette given projection and technique. Aortic atherosclerosis with calcification. No pneumothorax or pleural effusion. Clear lungs. No acute osseous abnormality identified. Bilateral breast prostheses. IMPRESSION: No active cardiopulmonary disease.  Aortic atherosclerosis. Electronically Signed   By: Kristine Garbe M.D.   On: 09/18/2017 21:54   Ct Head Wo Contrast  Result Date: 09/18/2017 CLINICAL DATA:  Altered mental status EXAM: CT HEAD WITHOUT CONTRAST TECHNIQUE: Contiguous axial images were obtained from the base of the skull through the vertex without intravenous contrast. COMPARISON:  None. FINDINGS: Brain: No large vessel territorial infarction or hemorrhage is visualized. Moderate atrophy. Mild small vessel ischemic changes of the white matter. Ventricle size within normal limits. Questionable asymmetric hypodensity within the right cerebellar white matter. Vascular: No hyperdense vessels. Calcifications at the carotid  siphons. Skull: No fracture Sinuses/Orbits: No acute finding. Other: None IMPRESSION: 1. Questionable asymmetric hypodensity/edema within the right cerebellar white matter, could further evaluate with MRI. Negative for hemorrhage. 2. Atrophy and mild small vessel ischemic changes of the white matter. Electronically Signed   By: Donavan Foil M.D.   On: 09/18/2017 22:02    EKG: Independently reviewed. Normal sinus rhythm without ST-T changes probably from LVH.  Assessment/Plan Principal Problem:   Syncope Active Problems:   Prediabetes   Hypertension   History of breast cancer   History of uterine cancer   Hypothyroid    1. Weakness/near syncope - will closely monitor in telemetry. Check orthostatics in the morning. Will hold diuretics for now. Check 2-D echo. Cycle cardiac markers.check MRI brain since patient's CT head was abnormal. Check EEG. 2. Hypertension uncontrolled - for now will keep patient on when necessary IV hydralazine. 3. Hypothyroidism on Synthroid. 4. History of breast cancer and uterine cancer in remission. 5. Anemia and thrombocytopenia - follow CBC.  I have reviewed patient's old charts and labs.   DVT prophylaxis: Lovenox. Code Status: full code.  Family Communication: discussed with patient.  Disposition Plan: home.  Consults called: none.  Admission status: observation.    Rise Patience MD [facility] Pager [phone].  If 7PM-7AM, please contact night-coverage [email or web address] Password [email or web address]  09/19/2017, 12:56 AM

## 2017-09-19 NOTE — ED Notes (Signed)
Pt to EEG.

## 2017-09-20 ENCOUNTER — Observation Stay (HOSPITAL_COMMUNITY): Payer: Medicare Other

## 2017-09-20 ENCOUNTER — Other Ambulatory Visit (HOSPITAL_COMMUNITY): Payer: Medicare Other

## 2017-09-20 DIAGNOSIS — Y92481 Parking lot as the place of occurrence of the external cause: Secondary | ICD-10-CM | POA: Diagnosis not present

## 2017-09-20 DIAGNOSIS — I4891 Unspecified atrial fibrillation: Secondary | ICD-10-CM

## 2017-09-20 DIAGNOSIS — R531 Weakness: Secondary | ICD-10-CM | POA: Diagnosis present

## 2017-09-20 DIAGNOSIS — Z888 Allergy status to other drugs, medicaments and biological substances status: Secondary | ICD-10-CM | POA: Diagnosis not present

## 2017-09-20 DIAGNOSIS — W010XXA Fall on same level from slipping, tripping and stumbling without subsequent striking against object, initial encounter: Secondary | ICD-10-CM | POA: Diagnosis present

## 2017-09-20 DIAGNOSIS — Z8673 Personal history of transient ischemic attack (TIA), and cerebral infarction without residual deficits: Secondary | ICD-10-CM | POA: Diagnosis not present

## 2017-09-20 DIAGNOSIS — I639 Cerebral infarction, unspecified: Secondary | ICD-10-CM | POA: Diagnosis not present

## 2017-09-20 DIAGNOSIS — M810 Age-related osteoporosis without current pathological fracture: Secondary | ICD-10-CM | POA: Diagnosis present

## 2017-09-20 DIAGNOSIS — E785 Hyperlipidemia, unspecified: Secondary | ICD-10-CM | POA: Diagnosis not present

## 2017-09-20 DIAGNOSIS — Z853 Personal history of malignant neoplasm of breast: Secondary | ICD-10-CM | POA: Diagnosis not present

## 2017-09-20 DIAGNOSIS — Z8542 Personal history of malignant neoplasm of other parts of uterus: Secondary | ICD-10-CM | POA: Diagnosis not present

## 2017-09-20 DIAGNOSIS — D649 Anemia, unspecified: Secondary | ICD-10-CM | POA: Diagnosis present

## 2017-09-20 DIAGNOSIS — R7309 Other abnormal glucose: Secondary | ICD-10-CM | POA: Diagnosis not present

## 2017-09-20 DIAGNOSIS — D696 Thrombocytopenia, unspecified: Secondary | ICD-10-CM | POA: Diagnosis present

## 2017-09-20 DIAGNOSIS — I10 Essential (primary) hypertension: Secondary | ICD-10-CM | POA: Diagnosis not present

## 2017-09-20 DIAGNOSIS — Z79899 Other long term (current) drug therapy: Secondary | ICD-10-CM | POA: Diagnosis not present

## 2017-09-20 DIAGNOSIS — I480 Paroxysmal atrial fibrillation: Secondary | ICD-10-CM | POA: Diagnosis not present

## 2017-09-20 DIAGNOSIS — E039 Hypothyroidism, unspecified: Secondary | ICD-10-CM | POA: Diagnosis not present

## 2017-09-20 DIAGNOSIS — Z79810 Long term (current) use of selective estrogen receptor modulators (SERMs): Secondary | ICD-10-CM | POA: Diagnosis not present

## 2017-09-20 DIAGNOSIS — R55 Syncope and collapse: Secondary | ICD-10-CM | POA: Diagnosis not present

## 2017-09-20 DIAGNOSIS — R7303 Prediabetes: Secondary | ICD-10-CM | POA: Diagnosis not present

## 2017-09-20 LAB — HEPARIN LEVEL (UNFRACTIONATED): HEPARIN UNFRACTIONATED: 0.53 [IU]/mL (ref 0.30–0.70)

## 2017-09-20 LAB — VITAMIN B12: Vitamin B-12: 297 pg/mL (ref 180–914)

## 2017-09-20 LAB — TSH: TSH: 3.548 u[IU]/mL (ref 0.350–4.500)

## 2017-09-20 LAB — MAGNESIUM: MAGNESIUM: 1.7 mg/dL (ref 1.7–2.4)

## 2017-09-20 MED ORDER — METOPROLOL TARTRATE 25 MG PO TABS: 25.0000 mg | ORAL_TABLET | Freq: Two times a day (BID) | ORAL | Status: DC

## 2017-09-20 MED ORDER — METOPROLOL SUCCINATE ER 25 MG PO TB24
25.0000 mg | ORAL_TABLET | Freq: Every day | ORAL | Status: DC
  Administered 2017-09-20 – 2017-09-21 (×2): 25 mg via ORAL
  Filled 2017-09-20 (×2): qty 1

## 2017-09-20 MED ORDER — AMIODARONE LOAD VIA INFUSION
150.0000 mg | Freq: Once | INTRAVENOUS | Status: AC
  Administered 2017-09-20: 150 mg via INTRAVENOUS
  Filled 2017-09-20: qty 83.34

## 2017-09-20 MED ORDER — HEPARIN BOLUS VIA INFUSION
3000.0000 [IU] | Freq: Once | INTRAVENOUS | Status: AC
  Administered 2017-09-20: 3000 [IU] via INTRAVENOUS
  Filled 2017-09-20: qty 3000

## 2017-09-20 MED ORDER — AMIODARONE HCL IN DEXTROSE 360-4.14 MG/200ML-% IV SOLN
60.0000 mg/h | INTRAVENOUS | Status: AC
  Administered 2017-09-20: 60 mg/h via INTRAVENOUS
  Filled 2017-09-20 (×2): qty 200

## 2017-09-20 MED ORDER — HEPARIN (PORCINE) IN NACL 100-0.45 UNIT/ML-% IJ SOLN
700.0000 [IU]/h | INTRAMUSCULAR | Status: DC
  Administered 2017-09-20: 700 [IU]/h via INTRAVENOUS
  Filled 2017-09-20: qty 250

## 2017-09-20 MED ORDER — AMIODARONE HCL IN DEXTROSE 360-4.14 MG/200ML-% IV SOLN
30.0000 mg/h | INTRAVENOUS | Status: DC
  Administered 2017-09-21: 30 mg/h via INTRAVENOUS
  Filled 2017-09-20 (×2): qty 200

## 2017-09-20 NOTE — Consult Note (Signed)
Reason for Consult: New onset A. fib with RVR Referring Physician: [facility]  Brooke Conley is an 83 y.o. female.  HPI: Patient is 83 year old female with past medical history significant for hypertension, hypothyroidism, prediabetic, history of breast and uterine cancer in the past, degenerative joint disease, was admitted yesterday because of weakness associated with dizziness. Patient denies any palpitations or syncopal episode. Denies any chest pain or shortness of breath. EKG done in the ED showed normal sinus rhythm with LVH no acute ischemic changes were noted MI was ruled out by serial enzymes and EKG this morning while walking patient went into paroxysmal atrial fibrillation with RVR. Patient denies any history of rheumatic fever in the past denies any palpitations. Denies any history of GI bleed in the past. Denies any head trauma or fall. MRI of the brain showed small remote right cerebellar infarct.  Past Medical History:  Diagnosis Date  . History of breast cancer   . History of uterine cancer 1982  . Hyperlipidemia   . Hypertension   . Osteoporosis   . Prediabetes   . Vitamin D deficiency     Past Surgical History:  Procedure Laterality Date  . BREAST SURGERY Right 2007   lumpectomy  . CATARACT EXTRACTION Right 2010  . COSMETIC SURGERY Bilateral 1979   Breast  . TONSILLECTOMY    . TOTAL ABDOMINAL HYSTERECTOMY W/ BILATERAL SALPINGOOPHORECTOMY      Family History  Problem Relation Age of Onset  . Cancer Mother        lung  . Hypertension Father     Social History:  reports that she has never smoked. She has never used smokeless tobacco. She reports that she does not drink alcohol or use drugs.  Allergies:  Allergies  Allergen Reactions  . Penicillins Rash    Has patient had a PCN reaction causing immediate rash, facial/tongue/throat swelling, SOB or lightheadedness with hypotension: No Has patient had a PCN reaction causing severe rash involving mucus  membranes or skin necrosis: No Has patient had a PCN reaction that required hospitalization: No Has patient had a PCN reaction occurring within the last 10 years: No If all of the above answers are "NO", then may proceed with Cephalosporin use.    Medications: I have reviewed the patient's current medications.  Results for orders placed or performed during the hospital encounter of 09/18/17 (from the past 48 hour(s))  Comprehensive metabolic panel     Status: Abnormal   Collection Time: 09/18/17  7:59 PM  Result Value Ref Range   Sodium 133 (L) 135 - 145 mmol/L   Potassium 3.7 3.5 - 5.1 mmol/L   Chloride 98 (L) 101 - 111 mmol/L   CO2 25 22 - 32 mmol/L   Glucose, Bld 119 (H) 65 - 99 mg/dL   BUN 17 6 - 20 mg/dL   Creatinine, Ser 1.56 (H) 0.44 - 1.00 mg/dL   Calcium 8.9 8.9 - 10.3 mg/dL   Total Protein 6.6 6.5 - 8.1 g/dL   Albumin 3.7 3.5 - 5.0 g/dL   AST 19 15 - 41 U/L   ALT 9 (L) 14 - 54 U/L   Alkaline Phosphatase 44 38 - 126 U/L   Total Bilirubin 0.5 0.3 - 1.2 mg/dL   GFR calc non Af Amer 29 (L) >60 mL/min   GFR calc Af Amer 33 (L) >60 mL/min    Comment: (NOTE) The eGFR has been calculated using the CKD EPI equation. This calculation has not been validated in  all clinical situations. eGFR's persistently <60 mL/min signify possible Chronic Kidney Disease.    Anion gap 10 5 - 15  Troponin I     Status: None   Collection Time: 09/18/17  7:59 PM  Result Value Ref Range   Troponin I <0.03 <0.03 ng/mL  CBC with Differential     Status: Abnormal   Collection Time: 09/18/17  7:59 PM  Result Value Ref Range   WBC 6.7 4.0 - 10.5 K/uL   RBC 3.83 (L) 3.87 - 5.11 MIL/uL   Hemoglobin 11.3 (L) 12.0 - 15.0 g/dL   HCT 24.1 (L) 00.1 - 53.1 %   MCV 90.3 78.0 - 100.0 fL   MCH 29.5 26.0 - 34.0 pg   MCHC 32.7 30.0 - 36.0 g/dL   RDW 09.7 06.4 - 68.9 %   Platelets 127 (L) 150 - 400 K/uL   Neutrophils Relative % 83 %   Neutro Abs 5.5 1.7 - 7.7 K/uL   Lymphocytes Relative 11 %   Lymphs  Abs 0.7 0.7 - 4.0 K/uL   Monocytes Relative 6 %   Monocytes Absolute 0.4 0.1 - 1.0 K/uL   Eosinophils Relative 0 %   Eosinophils Absolute 0.0 0.0 - 0.7 K/uL   Basophils Relative 0 %   Basophils Absolute 0.0 0.0 - 0.1 K/uL  I-Stat CG4 Lactic Acid, ED     Status: None   Collection Time: 09/18/17  8:14 PM  Result Value Ref Range   Lactic Acid, Venous 1.00 0.5 - 1.9 mmol/L  Urinalysis, Routine w reflex microscopic     Status: Abnormal   Collection Time: 09/18/17  9:15 PM  Result Value Ref Range   Color, Urine YELLOW YELLOW   APPearance CLEAR CLEAR   Specific Gravity, Urine 1.010 1.005 - 1.030   pH 7.0 5.0 - 8.0   Glucose, UA NEGATIVE NEGATIVE mg/dL   Hgb urine dipstick NEGATIVE NEGATIVE   Bilirubin Urine NEGATIVE NEGATIVE   Ketones, ur NEGATIVE NEGATIVE mg/dL   Protein, ur 402 (A) NEGATIVE mg/dL   Nitrite NEGATIVE NEGATIVE   Leukocytes, UA NEGATIVE NEGATIVE   RBC / HPF 0-5 0 - 5 RBC/hpf   WBC, UA 0-5 0 - 5 WBC/hpf   Bacteria, UA RARE (A) NONE SEEN   Squamous Epithelial / LPF 0-5 (A) NONE SEEN   Mucus PRESENT    Hyaline Casts, UA PRESENT   Troponin I (q 6hr x 3)     Status: None   Collection Time: 09/19/17  4:58 AM  Result Value Ref Range   Troponin I <0.03 <0.03 ng/mL  Troponin I (q 6hr x 3)     Status: None   Collection Time: 09/19/17 11:18 AM  Result Value Ref Range   Troponin I <0.03 <0.03 ng/mL  CBC with Differential/Platelet     Status: Abnormal   Collection Time: 09/19/17 11:18 AM  Result Value Ref Range   WBC 5.7 4.0 - 10.5 K/uL   RBC 3.57 (L) 3.87 - 5.11 MIL/uL   Hemoglobin 10.7 (L) 12.0 - 15.0 g/dL   HCT 65.7 (L) 67.9 - 64.3 %   MCV 91.0 78.0 - 100.0 fL   MCH 30.0 26.0 - 34.0 pg   MCHC 32.9 30.0 - 36.0 g/dL   RDW 55.7 55.0 - 79.9 %   Platelets 131 (L) 150 - 400 K/uL   Neutrophils Relative % 69 %   Neutro Abs 3.9 1.7 - 7.7 K/uL   Lymphocytes Relative 21 %   Lymphs Abs 1.2 0.7 -  4.0 K/uL   Monocytes Relative 9 %   Monocytes Absolute 0.5 0.1 - 1.0 K/uL    Eosinophils Relative 1 %   Eosinophils Absolute 0.1 0.0 - 0.7 K/uL   Basophils Relative 0 %   Basophils Absolute 0.0 0.0 - 0.1 K/uL  Comprehensive metabolic panel     Status: Abnormal   Collection Time: 09/19/17 11:18 AM  Result Value Ref Range   Sodium 137 135 - 145 mmol/L   Potassium 3.7 3.5 - 5.1 mmol/L   Chloride 103 101 - 111 mmol/L   CO2 26 22 - 32 mmol/L   Glucose, Bld 98 65 - 99 mg/dL   BUN 18 6 - 20 mg/dL   Creatinine, Ser 1.38 (H) 0.44 - 1.00 mg/dL   Calcium 8.6 (L) 8.9 - 10.3 mg/dL   Total Protein 6.0 (L) 6.5 - 8.1 g/dL   Albumin 3.3 (L) 3.5 - 5.0 g/dL   AST 19 15 - 41 U/L   ALT 10 (L) 14 - 54 U/L   Alkaline Phosphatase 45 38 - 126 U/L   Total Bilirubin 0.4 0.3 - 1.2 mg/dL   GFR calc non Af Amer 33 (L) >60 mL/min   GFR calc Af Amer 38 (L) >60 mL/min    Comment: (NOTE) The eGFR has been calculated using the CKD EPI equation. This calculation has not been validated in all clinical situations. eGFR's persistently <60 mL/min signify possible Chronic Kidney Disease.    Anion gap 8 5 - 15  Magnesium     Status: None   Collection Time: 09/19/17 11:18 AM  Result Value Ref Range   Magnesium 1.7 1.7 - 2.4 mg/dL  Phosphorus     Status: None   Collection Time: 09/19/17 11:18 AM  Result Value Ref Range   Phosphorus 2.8 2.5 - 4.6 mg/dL  Troponin I (q 6hr x 3)     Status: None   Collection Time: 09/19/17  4:00 PM  Result Value Ref Range   Troponin I <0.03 <0.03 ng/mL  TSH     Status: None   Collection Time: 09/20/17 10:09 AM  Result Value Ref Range   TSH 3.548 0.350 - 4.500 uIU/mL    Comment: Performed by a 3rd Generation assay with a functional sensitivity of <=0.01 uIU/mL.  Magnesium     Status: None   Collection Time: 09/20/17 10:09 AM  Result Value Ref Range   Magnesium 1.7 1.7 - 2.4 mg/dL    Dg Chest 2 View  Result Date: 09/18/2017 CLINICAL DATA:  83 y/o  F; syncope. EXAM: CHEST  2 VIEW COMPARISON:  08/15/2008 chest CT. FINDINGS: Normal cardiac silhouette  given projection and technique. Aortic atherosclerosis with calcification. No pneumothorax or pleural effusion. Clear lungs. No acute osseous abnormality identified. Bilateral breast prostheses. IMPRESSION: No active cardiopulmonary disease.  Aortic atherosclerosis. Electronically Signed   By: Kristine Garbe M.D.   On: 09/18/2017 21:54   Ct Head Wo Contrast  Result Date: 09/18/2017 CLINICAL DATA:  Altered mental status EXAM: CT HEAD WITHOUT CONTRAST TECHNIQUE: Contiguous axial images were obtained from the base of the skull through the vertex without intravenous contrast. COMPARISON:  None. FINDINGS: Brain: No large vessel territorial infarction or hemorrhage is visualized. Moderate atrophy. Mild small vessel ischemic changes of the white matter. Ventricle size within normal limits. Questionable asymmetric hypodensity within the right cerebellar white matter. Vascular: No hyperdense vessels. Calcifications at the carotid siphons. Skull: No fracture Sinuses/Orbits: No acute finding. Other: None IMPRESSION: 1. Questionable asymmetric hypodensity/edema within the  right cerebellar white matter, could further evaluate with MRI. Negative for hemorrhage. 2. Atrophy and mild small vessel ischemic changes of the white matter. Electronically Signed   By: Donavan Foil M.D.   On: 09/18/2017 22:02   Mr Brain Wo Contrast  Result Date: 09/19/2017 CLINICAL DATA:  Initial evaluation for acute altered mental status, syncope, fall. EXAM: MRI HEAD WITHOUT CONTRAST TECHNIQUE: Multiplanar, multiecho pulse sequences of the brain and surrounding structures were obtained without intravenous contrast. COMPARISON:  Prior CT from 09/18/2017. FINDINGS: Brain: Diffuse prominence of the CSF containing spaces compatible with generalized age related cerebral atrophy. Patchy and confluent T2/FLAIR hyperintensity within the periventricular and deep white matter both cerebral hemispheres most consistent with chronic small vessel  ischemic disease. Chronic microvascular ischemic changes present within the pons. Overall, findings are mild for age. Small remote right cerebellar infarct noted. No abnormal foci of restricted diffusion to suggest acute or subacute ischemia. Gray-white matter differentiation maintained. No other areas of chronic infarction. No evidence for acute intracranial hemorrhage. No mass lesion, midline shift or mass effect. No hydrocephalus. No extra-axial fluid collection. Major dural sinuses are grossly patent. Pituitary gland and suprasellar region normal. Midline structures intact and normal. Vascular: Major intracranial vascular flow voids are maintained. Skull and upper cervical spine: Degenerative thickening noted at the tectorial membrane with mild narrowing at the craniocervical junction. Visualized upper cervical spine within normal limits. Bone marrow signal intensity normal. No scalp soft tissue abnormality. Sinuses/Orbits: Globes and orbital soft tissues within normal limits. Patient status post lens extraction bilaterally. Paranasal sinuses are clear. No mastoid effusion. Inner ear structures normal. Other: None. IMPRESSION: 1. No acute intracranial abnormality identified. 2. Small remote right cerebellar infarct. 3. Mild age-related cerebral atrophy with chronic small vessel ischemic disease. Electronically Signed   By: Jeannine Boga M.D.   On: 09/19/2017 02:14    Review of Systems  Constitutional: Negative for chills and fever.  HENT: Negative for hearing loss.   Eyes: Negative for blurred vision.  Respiratory: Negative for shortness of breath.   Cardiovascular: Negative for chest pain, palpitations and claudication.  Gastrointestinal: Negative for nausea and vomiting.  Genitourinary: Negative for dysuria.  Skin: Negative for rash.  Neurological: Positive for dizziness and weakness.   Blood pressure (!) 147/85, pulse (!) 109, temperature 98 F (36.7 C), temperature source Oral, resp.  rate 18, SpO2 99 %. Physical Exam  Constitutional: She is oriented to person, place, and time.  Eyes: Conjunctivae are normal. Left eye exhibits no discharge. No scleral icterus.  Neck: Normal range of motion. Neck supple. No JVD present. No tracheal deviation present. No thyromegaly present.  Cardiovascular: Normal rate and regular rhythm.   Murmur (Soft systolic murmur noted) heard. Respiratory: Effort normal and breath sounds normal. No respiratory distress. She has no wheezes.  GI: Soft. Bowel sounds are normal. She exhibits no distension. There is no tenderness. There is no rebound.  Musculoskeletal: She exhibits no edema, tenderness or deformity.  Neurological: She is alert and oriented to person, place, and time.    Assessment/Plan: Status post paroxysmal A. fib with RVR. Chadsvasc score of 6 Hypertension Hypothyroidism Hyperlipidemia Prediabetic Remote very small right cerebellar infarct History of C hyperlipidemia he is prediabetic A of breast History of uterine CA Plan Agree with IV heparin and IV amiodarone. Check 2-D echo Will switch to by mouth amiodarone and Eliquis tomorrow Add low-dose beta blocker Charolette Forward 09/20/2017, 11:36 AM

## 2017-09-20 NOTE — Progress Notes (Signed)
[place] for heparin  Indication: atrial fibrillation  Allergies  Allergen Reactions  . Penicillins Rash    Has patient had a PCN reaction causing immediate rash, facial/tongue/throat swelling, SOB or lightheadedness with hypotension: No Has patient had a PCN reaction causing severe rash involving mucus membranes or skin necrosis: No Has patient had a PCN reaction that required hospitalization: No Has patient had a PCN reaction occurring within the last 10 years: No If all of the above answers are "NO", then may proceed with Cephalosporin use.   Patient Measurements: Wt: ~ 100 lbs per pt (~ 45 kg)  Vital Signs: Temp: 98.2 F (36.8 C) (10/17 1958) Temp Source: Oral (10/17 1958) BP: 133/53 (10/17 1958) Pulse Rate: 81 (10/17 1958)  Labs:  Recent Labs  09/18/17 1959 09/19/17 0458 09/19/17 1118 09/19/17 1600 09/20/17 2216  HGB 11.3*  --  10.7*  --   --   HCT 34.6*  --  32.5*  --   --   PLT 127*  --  131*  --   --   HEPARINUNFRC  --   --   --   --  0.53  CREATININE 1.56*  --  1.38*  --   --   TROPONINI <0.03 <0.03 <0.03 <0.03  --     Estimated Creatinine Clearance: 20 mL/min (A) (by C-G formula based on SCr of 1.38 mg/dL (H)).   Medical History: Past Medical History:  Diagnosis Date  . History of breast cancer   . History of uterine cancer 1982  . Hyperlipidemia   . Hypertension   . Osteoporosis   . Prediabetes   . Vitamin D deficiency     Medications:  Prescriptions Prior to Admission  Medication Sig Dispense Refill Last Dose  . BABY ASPIRIN PO Take 81 mg by mouth daily.   09/18/2017 at Unknown time  . Bilberry 1000 MG CAPS Take 1 capsule by mouth daily.   09/18/2017 at Unknown time  . Biotin 10 MG CAPS Take 10 mg by mouth daily.   09/18/2017 at Unknown time  . Calcium Citrate-Vitamin D (CALCIUM + D PO) Take 1 tablet by mouth 2 (two) times daily. Takes calcium 600 mg and vitamin D 800 units BID   09/18/2017 at Unknown time   . levothyroxine (SYNTHROID, LEVOTHROID) 50 MCG tablet Take 1 tablet by mouth  daily before breakfast (Patient taking differently: 0.25 mcg. Take 1 tablet by mouth  daily before breakfast) 90 tablet 1 09/18/2017 at 0900  . Omega-3 Fatty Acids (FISH OIL) 1000 MG CAPS Take 1 capsule by mouth daily.   09/18/2017 at Unknown time  . tamoxifen (NOLVADEX) 20 MG tablet TAKE 1 TABLET BY MOUTH  EVERY DAY FOR OSTEOPOROSIS (Patient taking differently: TAKE 20 mg TABLET BY MOUTH  EVERY DAY FOR OSTEOPOROSIS) 90 tablet 1 09/18/2017 at Unknown time  . triamterene-hydrochlorothiazide (MAXZIDE-25) 37.5-25 MG tablet TAKE 1 TABLET BY MOUTH  EVERY DAY FOR BLOOD  PRESSURE AND FLUID. 90 tablet 1 09/18/2017 at Unknown time   Scheduled:  . levothyroxine  25 mcg Oral QAC breakfast  . metoprolol succinate  25 mg Oral Daily    Assessment: 83 yo female with afib. Pharmacy consulted to dose heparin.   Heparin level is therapeutic at 0.53. No new CBC and no bleeding documented.   Goal of Therapy:  Heparin level 0.3-0.7 units/ml Monitor platelets by anticoagulation protocol: Yes   Plan:  Continue heparin gtt at 700 units/hr Heparin level in 8 hrs to confirm  Daily heparin level and CBC Monitor for s/s bleeding F/u long-term AC plans   Lavonda Jumbo, PharmD Clinical Pharmacist 09/20/17 11:41 PM

## 2017-09-20 NOTE — Evaluation (Signed)
Physical Therapy Evaluation Patient Details Name: Brooke Conley MRN: [id number] DOB: July 15, 1927 Today's Date: 09/20/2017   History of Present Illness  Pt is an 83 y/o female admitted secondary to weakness with ?fall/syncopal episode. PMH including but not limited to HTN, HLD and hx of breast and uterine cancer.  Clinical Impression  Pt presented supine in bed with HOB elevated, awake and willing to participate in therapy session. Prior to admission, pt reported that she is very independent with all functional mobility and ADLs. Pt lives alone in a two level condo with her bedroom/bathroom on the second floor. Pt has a son that lives nearby, but works during the day. Pt ambulated in the hallway without use of an AD with close min guard to min A required secondary to multiple LOB laterally. Pt reported that she still feels weak in general but is attributing that to laying in the bed. Feel pt would greatly benefit from [place] rehab at a SNF for further intensive therapy services to maximize her independence with functional mobility prior to returning home alone. Pt would continue to benefit from skilled physical therapy services at this time while admitted and after d/c to address the below listed limitations in order to improve overall safety and independence with functional mobility.     Follow Up Recommendations SNF;Other (comment) ([facility] rehab prior to d/c home alone)    Equipment Recommendations  None recommended by PT    Recommendations for Other Services       Precautions / Restrictions Precautions Precautions: Fall Restrictions Weight Bearing Restrictions: No      Mobility  Bed Mobility Overal bed mobility: Modified Independent             General bed mobility comments: increased time  Transfers Overall transfer level: Needs assistance Equipment used: None Transfers: Sit to/from Stand Sit to Stand: Min guard         General transfer comment: increased time, close min  guard for safety  Ambulation/Gait Ambulation/Gait assistance: Min assist Ambulation Distance (Feet): 75 Feet (75' x3 with standing breaks for balance) Assistive device: None Gait Pattern/deviations: Step-through pattern;Decreased step length - right;Decreased step length - left;Decreased stride length;Drifts right/left Gait velocity: decreased Gait velocity interpretation: <1.8 ft/sec, indicative of risk for recurrent falls General Gait Details: modest instability with LOB x3 during ambulation requiring min A to maintain upright position. pt with no reports of dizziness throughout. pt's HR tachy during ambulation 140-150's, pt asymptomatic.  Stairs            Wheelchair Mobility    Modified Rankin (Stroke Patients Only)       Balance Overall balance assessment: Needs assistance Sitting-balance support: Feet supported Sitting balance-Leahy Scale: Good Sitting balance - Comments: pt able to sit EOB with supervision   Standing balance support: During functional activity;No upper extremity supported Standing balance-Leahy Scale: Fair Standing balance comment: static standing is fair, dynamic is poor and with LOB x3 during ambulation                             Pertinent Vitals/Pain Pain Assessment: No/denies pain    Home Living Family/patient expects to be discharged to:: Private residence Living Arrangements: Alone Available Help at Discharge: Family;Friend(s);Available PRN/intermittently Type of Home: Other(Comment) (condo) Home Access: Level entry     Home Layout: Two level Home Equipment: None      Prior Function Level of Independence: Independent  Comments: drives     Hand Dominance   Dominant Hand: Right    Extremity/Trunk Assessment   Upper Extremity Assessment Upper Extremity Assessment: Generalized weakness    Lower Extremity Assessment Lower Extremity Assessment: Generalized weakness    Cervical / Trunk  Assessment Cervical / Trunk Assessment: Kyphotic  Communication   Communication: HOH  Cognition Arousal/Alertness: Awake/alert Behavior During Therapy: WFL for tasks assessed/performed Overall Cognitive Status: No family/caregiver present to determine baseline cognitive functioning Area of Impairment: Problem solving                             Problem Solving: Slow processing;Decreased initiation        General Comments      Exercises     Assessment/Plan    PT Assessment Patient needs continued PT services  PT Problem List Decreased strength;Decreased activity tolerance;Decreased balance;Decreased mobility;Decreased coordination;Decreased knowledge of use of DME;Decreased safety awareness;Decreased knowledge of precautions       PT Treatment Interventions DME instruction;Gait training;Stair training;Functional mobility training;Therapeutic activities;Neuromuscular re-education;Therapeutic exercise;Balance training;Patient/family education    PT Goals (Current goals can be found in the Care Plan section)  Acute Rehab PT Goals Patient Stated Goal: return to independence PT Goal Formulation: With patient Time For Goal Achievement: 10/04/17 Potential to Achieve Goals: Good    Frequency Min 3X/week   Barriers to discharge Decreased caregiver support      Co-evaluation               AM-PAC PT "6 Clicks" Daily Activity  Outcome Measure Difficulty turning over in bed (including adjusting bedclothes, sheets and blankets)?: None Difficulty moving from lying on back to sitting on the side of the bed? : None Difficulty sitting down on and standing up from a chair with arms (e.g., wheelchair, bedside commode, etc,.)?: A Little Help needed moving to and from a bed to chair (including a wheelchair)?: A Little Help needed walking in hospital room?: A Little Help needed climbing 3-5 steps with a railing? : A Lot 6 Click Score: 19    End of Session Equipment  Utilized During Treatment: Gait belt Activity Tolerance: Patient tolerated treatment well Patient left: in chair;with call bell/phone within reach;with chair alarm set Nurse Communication: Mobility status PT Visit Diagnosis: Unsteadiness on feet (R26.81);Other abnormalities of gait and mobility (R26.89)    Time: 1194-1740 PT Time Calculation (min) (ACUTE ONLY): 25 min   Charges:   PT Evaluation $PT Eval Moderate Complexity: 1 Mod PT Treatments $Gait Training: 8-22 mins   PT G Codes:   PT G-Codes **NOT FOR INPATIENT CLASS** Functional Assessment Tool Used: AM-PAC 6 Clicks Basic Mobility;Clinical judgement Functional Limitation: Mobility: Walking and moving around Mobility: Walking and Moving Around Current Status (C1448): At least 20 percent but less than 40 percent impaired, limited or restricted Mobility: Walking and Moving Around Goal Status [phone]): 0 percent impaired, limited or restricted    [facility], PT, DPT [place] 09/20/2017, 9:47 AM

## 2017-09-20 NOTE — Progress Notes (Signed)
Patient's son was notified of patient's move to a new unit and room number

## 2017-09-20 NOTE — Progress Notes (Signed)
[place] for heparin  Indication: atrial fibrillation  Allergies  Allergen Reactions  . Penicillins Rash    Has patient had a PCN reaction causing immediate rash, facial/tongue/throat swelling, SOB or lightheadedness with hypotension: No Has patient had a PCN reaction causing severe rash involving mucus membranes or skin necrosis: No Has patient had a PCN reaction that required hospitalization: No Has patient had a PCN reaction occurring within the last 10 years: No If all of the above answers are "NO", then may proceed with Cephalosporin use.    Patient Measurements: Wt: ~ 100 lbs per pt (~ 45 kg) Vital Signs: Temp: 98 F (36.7 C) (10/17 0900) Temp Source: Oral (10/17 0900) BP: 147/85 (10/17 0900) Pulse Rate: 109 (10/17 0900)  Labs:  Recent Labs  09/18/17 1959 09/19/17 0458 09/19/17 1118 09/19/17 1600  HGB 11.3*  --  10.7*  --   HCT 34.6*  --  32.5*  --   PLT 127*  --  131*  --   CREATININE 1.56*  --  1.38*  --   TROPONINI <0.03 <0.03 <0.03 <0.03    CrCl cannot be calculated (Unknown ideal weight.).   Medical History: Past Medical History:  Diagnosis Date  . History of breast cancer   . History of uterine cancer 1982  . Hyperlipidemia   . Hypertension   . Osteoporosis   . Prediabetes   . Vitamin D deficiency     Medications:  Prescriptions Prior to Admission  Medication Sig Dispense Refill Last Dose  . BABY ASPIRIN PO Take 81 mg by mouth daily.   09/18/2017 at Unknown time  . Bilberry 1000 MG CAPS Take 1 capsule by mouth daily.   09/18/2017 at Unknown time  . Biotin 10 MG CAPS Take 10 mg by mouth daily.   09/18/2017 at Unknown time  . Calcium Citrate-Vitamin D (CALCIUM + D PO) Take 1 tablet by mouth 2 (two) times daily. Takes calcium 600 mg and vitamin D 800 units BID   09/18/2017 at Unknown time  . levothyroxine (SYNTHROID, LEVOTHROID) 50 MCG tablet Take 1 tablet by mouth  daily before breakfast (Patient taking  differently: 0.25 mcg. Take 1 tablet by mouth  daily before breakfast) 90 tablet 1 09/18/2017 at 0900  . Omega-3 Fatty Acids (FISH OIL) 1000 MG CAPS Take 1 capsule by mouth daily.   09/18/2017 at Unknown time  . tamoxifen (NOLVADEX) 20 MG tablet TAKE 1 TABLET BY MOUTH  EVERY DAY FOR OSTEOPOROSIS (Patient taking differently: TAKE 20 mg TABLET BY MOUTH  EVERY DAY FOR OSTEOPOROSIS) 90 tablet 1 09/18/2017 at Unknown time  . triamterene-hydrochlorothiazide (MAXZIDE-25) 37.5-25 MG tablet TAKE 1 TABLET BY MOUTH  EVERY DAY FOR BLOOD  PRESSURE AND FLUID. 90 tablet 1 09/18/2017 at Unknown time   Scheduled:  . amiodarone  150 mg Intravenous Once  . levothyroxine  25 mcg Oral QAC breakfast    Assessment: 83 yo female with afib (CHADSVASC= 4; HTN, age, female, remote CVA?). Pharmacy consulted to dose heparin. No anticoagulants noted PTA -Hg= 10.7, plt= 131 (history low low plt)   Goal of Therapy:  Heparin level 0.3-0.7 units/ml Monitor platelets by anticoagulation protocol: Yes   Plan:  -Heparin bolus 3000 units IV followed by 700 units/hr (~ 14 units/kg/hr) -Heparin level in 8 hours and daily wth CBC daily  Hildred Laser, Pharm D 09/20/2017 11:13 AM

## 2017-09-20 NOTE — Progress Notes (Signed)
PROGRESS NOTE    Brooke Conley  IWP:[id number] DOB: Oct 05, 1927 DOA: 09/18/2017 PCP: [name], MD   Outpatient Specialists:     Brief Narrative:  Brooke Conley is a 83 y.o. female with history of hypertension, hypothyroidism breast cancer and uterine cancer in remission was brought to the ER after patient was feeling weak. Patient states she was shopping and on the way back to the car park patient started feeling weak and had to sit on the pavement. Patient states she did not pass out.There was some discrepancy on this. The store people had to call EMS and patient was brought to the ER.patient denies any chest pain shortness of breath nausea vomiting or diarrhea. No change in her medications.   ED Course: in the ER patient appeared nonfocal. CT of the head done showed nonspecific findings in the right cerebellum. MRI of the brain has been ordered. EKG was showing ST-T changes probably from LVH. Troponin was negative. Chest x-ray unremarkable. Patient admitted for further observation for possible workup for weakness/near syncope.   Assessment & Plan:   Principal Problem:   Syncope Active Problems:   Prediabetes   Hypertension   History of breast cancer   History of uterine cancer   Hypothyroid   Atrial fibrillation with RVR ([place])   New onset a fib -echo pending -cardiology consult appreciated -IV amio/heparin-- transition to PO in the AM  Weakness/near syncope -? From a fib -MRI with remote CVA -EEG normal  Hypertension -PRN  Hypothyroidism -check TSH -on synthroid  History of breast cancer and uterine cancer in remission.  Anemia and thrombocytopenia - follow CBC.   DVT prophylaxis:  Fully anticoagulated   Code Status: Full Code   Family Communication: son  Disposition Plan:  PT eval   Consultants:   cards   Subjective: Was weak when walking with PT  Objective: Vitals:   09/20/17 0051 09/20/17 0533 09/20/17 0900 09/20/17 1313    BP: (!) 152/72 (!) 197/73 (!) 147/85 134/61  Pulse: 71 75 (!) 109 (!) 106  Resp: 18 18 18    Temp: 98.1 F (36.7 C) 98.4 F (36.9 C) 98 F (36.7 C) 98.9 F (37.2 C)  TempSrc: Oral Oral Oral Oral  SpO2: 100% 99% 99% 100%  Weight:    45 kg (99 lb 3.3 oz)  Height:    5' 2.75" (1.594 m)    Intake/Output Summary (Last 24 hours) at 09/20/17 1335 Last data filed at 09/20/17 0900  Gross per 24 hour  Intake             1170 ml  Output                0 ml  Net             1170 ml   Filed Weights   09/20/17 1313  Weight: 45 kg (99 lb 3.3 oz)    Examination:  General exam: Appears calm and comfortable  Respiratory system: Clear to auscultation. Respiratory effort normal. Cardiovascular system: irregular and fast Gastrointestinal system: Abdomen is nondistended, soft and nontender. No organomegaly or masses felt. Normal bowel sounds heard. Central nervous system: Alert and oriented. No focal neurological deficits. Extremities: Symmetric 5 x 5 power. Skin: No rashes, lesions or ulcers Psychiatry: Judgement and insight appear normal. Mood & affect appropriate.     Data Reviewed: I have personally reviewed following labs and imaging studies  CBC:  Recent Labs Lab 09/18/17 1959 09/19/17 1118  WBC 6.7 5.7  NEUTROABS 5.5 3.9  HGB 11.3* 10.7*  HCT 34.6* 32.5*  MCV 90.3 91.0  PLT 127* 220*   Basic Metabolic Panel:  Recent Labs Lab 09/18/17 1959 09/19/17 1118 09/20/17 1009  NA 133* 137  --   K 3.7 3.7  --   CL 98* 103  --   CO2 25 26  --   GLUCOSE 119* 98  --   BUN 17 18  --   CREATININE 1.56* 1.38*  --   CALCIUM 8.9 8.6*  --   MG  --  1.7 1.7  PHOS  --  2.8  --    GFR: Estimated Creatinine Clearance: 20 mL/min (A) (by C-G formula based on SCr of 1.38 mg/dL (H)). Liver Function Tests:  Recent Labs Lab 09/18/17 1959 09/19/17 1118  AST 19 19  ALT 9* 10*  ALKPHOS 44 45  BILITOT 0.5 0.4  PROT 6.6 6.0*  ALBUMIN 3.7 3.3*   No results for input(s): LIPASE,  AMYLASE in the last 168 hours. No results for input(s): AMMONIA in the last 168 hours. Coagulation Profile: No results for input(s): INR, PROTIME in the last 168 hours. Cardiac Enzymes:  Recent Labs Lab 09/18/17 1959 09/19/17 0458 09/19/17 1118 09/19/17 1600  TROPONINI <0.03 <0.03 <0.03 <0.03   BNP (last 3 results) No results for input(s): PROBNP in the last 8760 hours. HbA1C: No results for input(s): HGBA1C in the last 72 hours. CBG: No results for input(s): GLUCAP in the last 168 hours. Lipid Profile: No results for input(s): CHOL, HDL, LDLCALC, TRIG, CHOLHDL, LDLDIRECT in the last 72 hours. Thyroid Function Tests:  Recent Labs  09/20/17 1009  TSH 3.548   Anemia Panel:  Recent Labs  09/20/17 1009  VITAMINB12 297   Urine analysis:    Component Value Date/Time   COLORURINE YELLOW 09/18/2017 2115   APPEARANCEUR CLEAR 09/18/2017 2115   LABSPEC 1.010 09/18/2017 2115   LABSPEC 1.010 05/09/2006 1305   PHURINE 7.0 09/18/2017 2115   GLUCOSEU NEGATIVE 09/18/2017 2115   HGBUR NEGATIVE 09/18/2017 2115   BILIRUBINUR NEGATIVE 09/18/2017 2115   BILIRUBINUR Negative 05/09/2006 [place] 09/18/2017 2115   PROTEINUR 100 (A) 09/18/2017 2115   UROBILINOGEN 0.2 09/11/2014 1527   NITRITE NEGATIVE 09/18/2017 2115   LEUKOCYTESUR NEGATIVE 09/18/2017 2115   LEUKOCYTESUR Negative 05/09/2006 1305     )No results found for this or any previous visit (from the past 240 hour(s)).    Anti-infectives    None       Radiology Studies: Dg Chest 2 View  Result Date: 09/18/2017 CLINICAL DATA:  83 y/o  F; syncope. EXAM: CHEST  2 VIEW COMPARISON:  08/15/2008 chest CT. FINDINGS: Normal cardiac silhouette given projection and technique. Aortic atherosclerosis with calcification. No pneumothorax or pleural effusion. Clear lungs. No acute osseous abnormality identified. Bilateral breast prostheses. IMPRESSION: No active cardiopulmonary disease.  Aortic atherosclerosis.  Electronically Signed   By: Kristine Garbe M.D.   On: 09/18/2017 21:54   Ct Head Wo Contrast  Result Date: 09/18/2017 CLINICAL DATA:  Altered mental status EXAM: CT HEAD WITHOUT CONTRAST TECHNIQUE: Contiguous axial images were obtained from the base of the skull through the vertex without intravenous contrast. COMPARISON:  None. FINDINGS: Brain: No large vessel territorial infarction or hemorrhage is visualized. Moderate atrophy. Mild small vessel ischemic changes of the white matter. Ventricle size within normal limits. Questionable asymmetric hypodensity within the right cerebellar white matter. Vascular: No hyperdense vessels. Calcifications at the carotid siphons. Skull: No fracture Sinuses/Orbits: No  acute finding. Other: None IMPRESSION: 1. Questionable asymmetric hypodensity/edema within the right cerebellar white matter, could further evaluate with MRI. Negative for hemorrhage. 2. Atrophy and mild small vessel ischemic changes of the white matter. Electronically Signed   By: Donavan Foil M.D.   On: 09/18/2017 22:02   Mr Brain Wo Contrast  Result Date: 09/19/2017 CLINICAL DATA:  Initial evaluation for acute altered mental status, syncope, fall. EXAM: MRI HEAD WITHOUT CONTRAST TECHNIQUE: Multiplanar, multiecho pulse sequences of the brain and surrounding structures were obtained without intravenous contrast. COMPARISON:  Prior CT from 09/18/2017. FINDINGS: Brain: Diffuse prominence of the CSF containing spaces compatible with generalized age related cerebral atrophy. Patchy and confluent T2/FLAIR hyperintensity within the periventricular and deep white matter both cerebral hemispheres most consistent with chronic small vessel ischemic disease. Chronic microvascular ischemic changes present within the pons. Overall, findings are mild for age. Small remote right cerebellar infarct noted. No abnormal foci of restricted diffusion to suggest acute or subacute ischemia. Gray-white matter  differentiation maintained. No other areas of chronic infarction. No evidence for acute intracranial hemorrhage. No mass lesion, midline shift or mass effect. No hydrocephalus. No extra-axial fluid collection. Major dural sinuses are grossly patent. Pituitary gland and suprasellar region normal. Midline structures intact and normal. Vascular: Major intracranial vascular flow voids are maintained. Skull and upper cervical spine: Degenerative thickening noted at the tectorial membrane with mild narrowing at the craniocervical junction. Visualized upper cervical spine within normal limits. Bone marrow signal intensity normal. No scalp soft tissue abnormality. Sinuses/Orbits: Globes and orbital soft tissues within normal limits. Patient status post lens extraction bilaterally. Paranasal sinuses are clear. No mastoid effusion. Inner ear structures normal. Other: None. IMPRESSION: 1. No acute intracranial abnormality identified. 2. Small remote right cerebellar infarct. 3. Mild age-related cerebral atrophy with chronic small vessel ischemic disease. Electronically Signed   By: Jeannine Boga M.D.   On: 09/19/2017 02:14        Scheduled Meds: . amiodarone  150 mg Intravenous Once  . heparin  3,000 Units Intravenous Once  . levothyroxine  25 mcg Oral QAC breakfast  . metoprolol succinate  25 mg Oral Daily   Continuous Infusions: . amiodarone     Followed by  . amiodarone    . heparin       LOS: 0 days    Time spent: 35 min    [place], DO [facility] Pager [phone]  If 7PM-7AM, please contact night-coverage [email or web address] Password [email or web address] 09/20/2017, 1:35 PM

## 2017-09-21 ENCOUNTER — Inpatient Hospital Stay (HOSPITAL_COMMUNITY): Payer: Medicare Other

## 2017-09-21 DIAGNOSIS — I4891 Unspecified atrial fibrillation: Secondary | ICD-10-CM

## 2017-09-21 LAB — ECHOCARDIOGRAM COMPLETE
Ao-asc: 24 cm
CHL CUP PV REG GRAD DIAS: 6 mmHg
CHL CUP REG VEL DIAS: 118 cm/s
E decel time: 201 msec
EERAT: 17
FS: 26 % — AB (ref 28–44)
Height: 62.75 in
IVS/LV PW RATIO, ED: 1.03
LA ID, A-P, ES: 33 mm
LA diam end sys: 33 mm
LA diam index: 2.35 cm/m2
LA vol A4C: 33 ml
LA vol index: 24.7 mL/m2
LAVOL: 34.6 mL
LV TDI E'MEDIAL: 5.66
LVEEAVG: 17
LVEEMED: 17
LVELAT: 6.53 cm/s
LVOT area: 2.84 cm2
LVOTD: 19 mm
MV Dec: 201
MVPG: 5 mmHg
MVPKAVEL: 96.5 m/s
MVPKEVEL: 111 m/s
PW: 11.9 mm — AB (ref 0.6–1.1)
RV LATERAL S' VELOCITY: 12.1 cm/s
RV TAPSE: 26.9 mm
Reg peak vel: 279 cm/s
TDI e' lateral: 6.53
TR max vel: 279 cm/s
Weight: 1587.2 oz

## 2017-09-21 LAB — CBC
HEMATOCRIT: 33.1 % — AB (ref 36.0–46.0)
HEMOGLOBIN: 10.8 g/dL — AB (ref 12.0–15.0)
MCH: 30 pg (ref 26.0–34.0)
MCHC: 32.6 g/dL (ref 30.0–36.0)
MCV: 91.9 fL (ref 78.0–100.0)
Platelets: 140 10*3/uL — ABNORMAL LOW (ref 150–400)
RBC: 3.6 MIL/uL — ABNORMAL LOW (ref 3.87–5.11)
RDW: 13.9 % (ref 11.5–15.5)
WBC: 6.7 10*3/uL (ref 4.0–10.5)

## 2017-09-21 LAB — HEPARIN LEVEL (UNFRACTIONATED): Heparin Unfractionated: 0.78 IU/mL — ABNORMAL HIGH (ref 0.30–0.70)

## 2017-09-21 MED ORDER — MAGNESIUM SULFATE 2 GM/50ML IV SOLN
2.0000 g | Freq: Once | INTRAVENOUS | Status: AC
  Administered 2017-09-21: 2 g via INTRAVENOUS
  Filled 2017-09-21: qty 50

## 2017-09-21 MED ORDER — METOPROLOL SUCCINATE ER 50 MG PO TB24
50.0000 mg | ORAL_TABLET | Freq: Every day | ORAL | Status: DC
  Administered 2017-09-22: 50 mg via ORAL
  Filled 2017-09-21: qty 1

## 2017-09-21 MED ORDER — APIXABAN 2.5 MG PO TABS
2.5000 mg | ORAL_TABLET | Freq: Two times a day (BID) | ORAL | Status: DC
  Administered 2017-09-21 – 2017-09-22 (×3): 2.5 mg via ORAL
  Filled 2017-09-21 (×3): qty 1

## 2017-09-21 MED ORDER — AMIODARONE HCL 200 MG PO TABS
200.0000 mg | ORAL_TABLET | Freq: Two times a day (BID) | ORAL | Status: DC
  Administered 2017-09-21 – 2017-09-22 (×3): 200 mg via ORAL
  Filled 2017-09-21 (×3): qty 1

## 2017-09-21 MED ORDER — VITAMIN B-12 100 MCG PO TABS
500.0000 ug | ORAL_TABLET | Freq: Every day | ORAL | Status: DC
  Administered 2017-09-21 – 2017-09-22 (×2): 500 ug via ORAL
  Filled 2017-09-21 (×2): qty 5

## 2017-09-21 NOTE — Progress Notes (Signed)
ANTICOAGULATION CONSULT NOTE - Initial Consult  Pharmacy Consult for Eliquis Indication: atrial fibrillation  Allergies  Allergen Reactions  . Penicillins Rash    Has patient had a PCN reaction causing immediate rash, facial/tongue/throat swelling, SOB or lightheadedness with hypotension: No Has patient had a PCN reaction causing severe rash involving mucus membranes or skin necrosis: No Has patient had a PCN reaction that required hospitalization: No Has patient had a PCN reaction occurring within the last 10 years: No If all of the above answers are "NO", then may proceed with Cephalosporin use.    Patient Measurements: Height: 5' 2.75" (159.4 cm) Weight: 99 lb 3.2 oz (45 kg) IBW/kg (Calculated) : 51.83  Vital Signs: Temp: 97.7 F (36.5 C) (10/18 0816) Temp Source: Oral (10/18 0816) BP: 133/60 (10/18 0816) Pulse Rate: 83 (10/18 0857)  Labs:  Recent Labs  09/18/17 1959 09/19/17 0458 09/19/17 1118 09/19/17 1600 09/20/17 2216 09/21/17 0611  HGB 11.3*  --  10.7*  --   --  10.8*  HCT 34.6*  --  32.5*  --   --  33.1*  PLT 127*  --  131*  --   --  140*  HEPARINUNFRC  --   --   --   --  0.53 0.78*  CREATININE 1.56*  --  1.38*  --   --   --   TROPONINI <0.03 <0.03 <0.03 <0.03  --   --     Estimated Creatinine Clearance: 20 mL/min (A) (by C-G formula based on SCr of 1.38 mg/dL (H)).   Medical History: Past Medical History:  Diagnosis Date  . History of breast cancer   . History of uterine cancer 1982  . Hyperlipidemia   . Hypertension   . Osteoporosis   . Prediabetes   . Vitamin D deficiency     Assessment:  Anticoag: afib. CHADS2VASC=8 (unknown HF). No anticoagulants noted PTA -Hg= 10.8, plt= 140 stable (history low low plt)   Goal of Therapy:  Therapeutic oral anticoagulation    Plan:  -d/c IV heparin - Eliquis 2.5mg  BID  Cadience Bradfield S. Alford Highland, PharmD, BCPS Clinical Staff Pharmacist Pager [phone]  Eilene Ghazi Stillinger 09/21/2017,10:29  AM

## 2017-09-21 NOTE — Progress Notes (Signed)
Subjective:  Doing well denies any chest pain or shortness of breath. Remains in sinus rhythm. No further episodes of A. Fib with RVR.  Objective:  Vital Signs in the last 24 hours: Temp:  [97.7 F (36.5 C)-98.9 F (37.2 C)] 97.7 F (36.5 C) (10/18 0816) Pulse Rate:  [75-106] 83 (10/18 0857) Resp:  [16-18] 16 (10/18 0415) BP: (108-161)/(52-87) 133/60 (10/18 0816) SpO2:  [99 %-100 %] 100 % (10/18 0016) Weight:  [45 kg (99 lb 3.2 oz)-45 kg (99 lb 3.3 oz)] 45 kg (99 lb 3.2 oz) (10/18 0415)  Intake/Output from previous day: 10/17 0701 - 10/18 0700 In: 683.1 [P.O.:240; I.V.:443.1] Out: -  Intake/Output from this shift: No intake/output data recorded.  Physical Exam: Neck: no adenopathy, no carotid bruit, no JVD and supple, symmetrical, trachea midline Lungs: clear to auscultation bilaterally Heart: regular rate and rhythm, S1, S2 normal and soft systolic murmur noted no S3 gallop Abdomen: soft, non-tender; bowel sounds normal; no masses,  no organomegaly Extremities: extremities normal, atraumatic, no cyanosis or edema  Lab Results:  Recent Labs  09/19/17 1118 09/21/17 0611  WBC 5.7 6.7  HGB 10.7* 10.8*  PLT 131* 140*    Recent Labs  09/18/17 1959 09/19/17 1118  NA 133* 137  K 3.7 3.7  CL 98* 103  CO2 25 26  GLUCOSE 119* 98  BUN 17 18  CREATININE 1.56* 1.38*    Recent Labs  09/19/17 1118 09/19/17 1600  TROPONINI <0.03 <0.03   Hepatic Function Panel  Recent Labs  09/19/17 1118  PROT 6.0*  ALBUMIN 3.3*  AST 19  ALT 10*  ALKPHOS 45  BILITOT 0.4   No results for input(s): CHOL in the last 72 hours. No results for input(s): PROTIME in the last 72 hours.  Imaging: Imaging results have been reviewed and No results found.  Cardiac Studies:  Assessment/Plan:  Status post paroxysmal A. fib with RVR. Chadsvasc score of 6 Hypertension Hypothyroidism Hyperlipidemia Prediabetic Remote very small right cerebellar infarct History of carcinoma of  breast History of uterine carcinoma Plan Switch IV amiodarone to by mouth as per orders Increase metoprolol as per orders Check 2-D echo   LOS: 1 day    Charolette Forward 09/21/2017, 9:32 AM

## 2017-09-21 NOTE — Progress Notes (Signed)
  Echocardiogram 2D Echocardiogram has been performed.  Karrigan Messamore G Jesusita Jocelyn 09/21/2017, 11:18 AM

## 2017-09-21 NOTE — Care Management Note (Addendum)
Case Management Note  Patient Details  Name: Brooke Conley MRN: [id number] Date of Birth: Jul 27, 1927  Subjective/Objective: Pt presented for Syncope and fall. Per pt she is from home alone- independent and still drives. Pt states she has support of a son that lives in [place]. Plan to be d/c on Eliquis. Benefits Check in process.                    Action/Plan: CM will make pt aware of cost once completed. CM will continue to monitor for disposition needs.    Expected Discharge Date:                  Expected Discharge Plan:  [place]  In-House Referral:  Clinical Social Work  Discharge planning Services  CM Consult  Post Acute Care Choice:   N/A Choice offered to:   N/A  DME Arranged:   N/A DME Agency:   N/A  HH Arranged:   N/A HH Agency:   N/A  Status of Service:  Completed.   If discussed at [place] of Stay Meetings, dates discussed:    Additional Comments: 1423 09-22-17 Jacqlyn Krauss, RN,BSN [phone] Pt is still declining HH Services-Patient and son is aware that they can contact PCP with [facility] Needs if they need in the future. No further needs from CM at this time.    1558 09-21-17 Jacqlyn Krauss, RN,BSN [phone] Benefits Check Completed: Pt copy will be $45. Prior Josem Kaufmann is not required. CM will provide pt with 30 day free card. CM to monitor for additional disposition needs. Recommendations now for [facility] Services.  Bethena Roys, RN 09/21/2017, 2:04 PM

## 2017-09-21 NOTE — Progress Notes (Signed)
PROGRESS NOTE    Brooke Conley  JYN:[id number] DOB: [birth year] DOA: 09/18/2017 PCP: [name], MD   Outpatient Specialists:     Brief Narrative:  Brooke Conley is a 83 y.o. female with history of hypertension, hypothyroidism breast cancer and uterine cancer in remission was brought to the ER after patient was feeling weak. Patient states she was shopping and on the way back to the car park patient started feeling weak and had to sit on the pavement. Patient states she did not pass out.There was some discrepancy on this. The store people had to call EMS and patient was brought to the ER.patient denies any chest pain shortness of breath nausea vomiting or diarrhea. No change in her medications.  While in the hospital patient went into a fib with RVR, no history of this prior.  Cardiology consulted and patient given amio/heparin and converted back to sinus rhythm.     Assessment & Plan:   Principal Problem:   Syncope Active Problems:   Prediabetes   Hypertension   History of breast cancer   History of uterine cancer   Hypothyroid   Atrial fibrillation with RVR (HCC)   New onset a fib- rapid rate-- converted back to sinus -echo pending still -cardiology consult appreciated: transition IV amio/heparin to PO medications and increase BB  Weakness/near syncope -? From a fib -MRI with remote CVA -EEG normal -PT re-eval  Hypertension -on BB  Hypothyroidism -TSH normal -on synthroid  History of breast cancer and uterine cancer in remission.  Anemia and thrombocytopenia - follow CBC. -heme test stools  DVT prophylaxis:  Fully anticoagulated   Code Status: Full Code   Family Communication: Son on phone 10/17  Disposition Plan:  PT eval   Consultants:   cards   Subjective: No overnight events-- did not feel her heart racing when she was in a fib yesterday  Objective: Vitals:   09/21/17 0415 09/21/17 0520 09/21/17 0816 09/21/17 0857  BP: (!)  161/66 108/87 133/60   Pulse:    83  Resp: 16     Temp: 98.6 F (37 C)  97.7 F (36.5 C)   TempSrc: Oral  Oral   SpO2:      Weight: 45 kg (99 lb 3.2 oz)     Height:        Intake/Output Summary (Last 24 hours) at 09/21/17 1104 Last data filed at 09/21/17 0600  Gross per 24 hour  Intake           443.14 ml  Output                0 ml  Net           443.14 ml   Filed Weights   09/20/17 1313 09/21/17 0415  Weight: 45 kg (99 lb 3.3 oz) 45 kg (99 lb 3.2 oz)    Examination:  General exam: in bed, face flushed but in NAD Respiratory system: clear Cardiovascular system: rrr Gastrointestinal system: +BS, soft Central nervous system: Alert  Skin: no rash Psychiatry: normal mood, mildly hard of hearing    Data Reviewed: I have personally reviewed following labs and imaging studies  CBC:  Recent Labs Lab 09/18/17 1959 09/19/17 1118 09/21/17 0611  WBC 6.7 5.7 6.7  NEUTROABS 5.5 3.9  --   HGB 11.3* 10.7* 10.8*  HCT 34.6* 32.5* 33.1*  MCV 90.3 91.0 91.9  PLT 127* 131* 865*   Basic Metabolic Panel:  Recent Labs Lab 09/18/17 1959 09/19/17 1118  09/20/17 1009  NA 133* 137  --   K 3.7 3.7  --   CL 98* 103  --   CO2 25 26  --   GLUCOSE 119* 98  --   BUN 17 18  --   CREATININE 1.56* 1.38*  --   CALCIUM 8.9 8.6*  --   MG  --  1.7 1.7  PHOS  --  2.8  --    GFR: Estimated Creatinine Clearance: 20 mL/min (A) (by C-G formula based on SCr of 1.38 mg/dL (H)). Liver Function Tests:  Recent Labs Lab 09/18/17 1959 09/19/17 1118  AST 19 19  ALT 9* 10*  ALKPHOS 44 45  BILITOT 0.5 0.4  PROT 6.6 6.0*  ALBUMIN 3.7 3.3*   No results for input(s): LIPASE, AMYLASE in the last 168 hours. No results for input(s): AMMONIA in the last 168 hours. Coagulation Profile: No results for input(s): INR, PROTIME in the last 168 hours. Cardiac Enzymes:  Recent Labs Lab 09/18/17 1959 09/19/17 0458 09/19/17 1118 09/19/17 1600  TROPONINI <0.03 <0.03 <0.03 <0.03   BNP (last 3  results) No results for input(s): PROBNP in the last 8760 hours. HbA1C: No results for input(s): HGBA1C in the last 72 hours. CBG: No results for input(s): GLUCAP in the last 168 hours. Lipid Profile: No results for input(s): CHOL, HDL, LDLCALC, TRIG, CHOLHDL, LDLDIRECT in the last 72 hours. Thyroid Function Tests:  Recent Labs  09/20/17 1009  TSH 3.548   Anemia Panel:  Recent Labs  09/20/17 1009  VITAMINB12 297   Urine analysis:    Component Value Date/Time   COLORURINE YELLOW 09/18/2017 2115   APPEARANCEUR CLEAR 09/18/2017 2115   LABSPEC 1.010 09/18/2017 2115   LABSPEC 1.010 05/09/2006 1305   PHURINE 7.0 09/18/2017 2115   GLUCOSEU NEGATIVE 09/18/2017 2115   HGBUR NEGATIVE 09/18/2017 2115   BILIRUBINUR NEGATIVE 09/18/2017 2115   BILIRUBINUR Negative 05/09/2006 [place] 09/18/2017 2115   PROTEINUR 100 (A) 09/18/2017 2115   UROBILINOGEN 0.2 09/11/2014 1527   NITRITE NEGATIVE 09/18/2017 2115   LEUKOCYTESUR NEGATIVE 09/18/2017 2115   LEUKOCYTESUR Negative 05/09/2006 1305     )No results found for this or any previous visit (from the past 240 hour(s)).    Anti-infectives    None       Radiology Studies: No results found.      Scheduled Meds: . amiodarone  200 mg Oral BID  . apixaban  2.5 mg Oral BID  . levothyroxine  25 mcg Oral QAC breakfast  . [START ON 09/22/2017] metoprolol succinate  50 mg Oral Daily  . vitamin B-12  500 mcg Oral Daily   Continuous Infusions: . magnesium sulfate 1 - 4 g bolus IVPB 2 g (09/21/17 1045)     LOS: 1 day    Time spent: 50 min    [place], DO [facility] Pager [phone]  If 7PM-7AM, please contact night-coverage [email or web address] Password [email or web address] 09/21/2017, 11:04 AM

## 2017-09-21 NOTE — Progress Notes (Signed)
Physical Therapy Treatment Patient Details Name: Brooke Conley MRN: [id number] DOB: 06-21-1927 Today's Date: 09/21/2017    History of Present Illness Pt is an 83 y/o female admitted secondary to weakness with ?fall/syncopal episode. PMH including but not limited to HTN, HLD and hx of breast and uterine cancer.    PT Comments    Pt demo good progress with mobility. She only required supervision for transfers and ambulation 300 feet. D/C recommendations updated to HHPT for home safety assessment (due to pt living alone).  Follow Up Recommendations  Home health PT (for safety assessment)     Equipment Recommendations  None recommended by PT    Recommendations for Other Services       Precautions / Restrictions Precautions Precautions: Fall Restrictions Weight Bearing Restrictions: No    Mobility  Bed Mobility Overal bed mobility: Modified Independent             General bed mobility comments: increased time  Transfers   Equipment used: None   Sit to Stand: Supervision         General transfer comment: increased time, supervision for safety  Ambulation/Gait Ambulation/Gait assistance: Supervision Ambulation Distance (Feet): 300 Feet Assistive device: None Gait Pattern/deviations: Step-through pattern;Decreased stride length Gait velocity: decreased   General Gait Details: no LOB, no physical assist, HR remained in the 80s. No c/o dizziness.   Stairs            Wheelchair Mobility    Modified Rankin (Stroke Patients Only)       Balance   Sitting-balance support: Feet supported;No upper extremity supported Sitting balance-Leahy Scale: Good     Standing balance support: During functional activity;No upper extremity supported Standing balance-Leahy Scale: Fair                              Cognition Arousal/Alertness: Awake/alert Behavior During Therapy: WFL for tasks assessed/performed Overall Cognitive Status: No  family/caregiver present to determine baseline cognitive functioning                               Problem Solving: Slow processing        Exercises      General Comments        Pertinent Vitals/Pain Pain Assessment: No/denies pain    Home Living                      Prior Function            PT Goals (current goals can now be found in the care plan section) Acute Rehab PT Goals Patient Stated Goal: return to independence PT Goal Formulation: With patient Time For Goal Achievement: 10/04/17 Potential to Achieve Goals: Good Progress towards PT goals: Progressing toward goals    Frequency    Min 3X/week      PT Plan Discharge plan needs to be updated    Co-evaluation              AM-PAC PT "6 Clicks" Daily Activity  Outcome Measure  Difficulty turning over in bed (including adjusting bedclothes, sheets and blankets)?: None Difficulty moving from lying on back to sitting on the side of the bed? : None Difficulty sitting down on and standing up from a chair with arms (e.g., wheelchair, bedside commode, etc,.)?: None Help needed moving to and from a bed to chair (including a wheelchair)?:  None Help needed walking in hospital room?: None Help needed climbing 3-5 steps with a railing? : A Little 6 Click Score: 23    End of Session Equipment Utilized During Treatment: Gait belt Activity Tolerance: Patient tolerated treatment well Patient left: in bed;with call bell/phone within reach;with bed alarm set Nurse Communication: Mobility status PT Visit Diagnosis: Unsteadiness on feet (R26.81);Other abnormalities of gait and mobility (R26.89)     Time: 6720-9470 PT Time Calculation (min) (ACUTE ONLY): 24 min  Charges:  $Gait Training: 23-37 mins                    G Codes:       Brooke Conley, PT  Office # [phone] Pager [phone]    Brooke Conley 09/21/2017, 12:34 PM

## 2017-09-22 ENCOUNTER — Other Ambulatory Visit: Payer: Self-pay | Admitting: Internal Medicine

## 2017-09-22 DIAGNOSIS — I10 Essential (primary) hypertension: Secondary | ICD-10-CM

## 2017-09-22 DIAGNOSIS — I4891 Unspecified atrial fibrillation: Secondary | ICD-10-CM

## 2017-09-22 DIAGNOSIS — Z853 Personal history of malignant neoplasm of breast: Secondary | ICD-10-CM

## 2017-09-22 DIAGNOSIS — E039 Hypothyroidism, unspecified: Secondary | ICD-10-CM

## 2017-09-22 MED ORDER — APIXABAN 2.5 MG PO TABS: 2.5000 mg | ORAL_TABLET | Freq: Two times a day (BID) | ORAL | 0 refills | Status: DC

## 2017-09-22 MED ORDER — CYANOCOBALAMIN 500 MCG PO TABS
500.0000 ug | ORAL_TABLET | Freq: Every day | ORAL | 0 refills | Status: AC
Start: 2017-09-23 — End: 2017-10-08

## 2017-09-22 MED ORDER — AMIODARONE HCL 200 MG PO TABS: 200.0000 mg | ORAL_TABLET | Freq: Two times a day (BID) | ORAL | 0 refills | Status: DC

## 2017-09-22 MED ORDER — METOPROLOL SUCCINATE ER 50 MG PO TB24: 50.0000 mg | ORAL_TABLET | Freq: Every day | ORAL | 0 refills | Status: DC

## 2017-09-22 NOTE — Consult Note (Signed)
[facility] CM Primary Care Navigator  09/22/2017  Brooke Conley Dec 19, 1926 [id number]    Met with patient at the bedside to identify possible discharge needs.  Patient reports having a fall at a store parking lot that had led to this admission.  Patient endorses Dr. [name] with [facility] asherprimary care provider.   [facility] [address] [facility] [employer] [facility] without any problem. Patient reports managing her ownmedications at home with use of "pill box" system filled weekly.   According to patient, shewas stilldriving prior to admission but son(Fred) can providetransportation to her doctors' appointments after discharge if needed.  Patient reports that she lives alone and independent with care prior to admission. Patient states that son (lives in [place]) will be theprimary caregiver for her as needed. Patient mentioned that she can ask her son to stay with her for few days as she recovers.  Anticipated discharge plan is home according to patient. Per Inpatient CM, patient still declining home health services per therapy recommendation.  Notified RN that was unable to convince patient to have home health services at home. RN states will talk to son about it when he comes in.  Reinforced with patient that they can contact primary care provider with home health needs if she feels the need at home later or in the future.  Patientexpressed understanding to callprimary care provider's officewhen she returnshome for a post discharge follow-up appointment within a week or sooner if needed. Patient letter (with PCP's contact number) was provided as a reminder.  Explained to patient about [facility] CM services available for health management at home but she deniesany needs or concerns at this time. Patient states "I can manage fine by myself."  Patient  was encouraged and had verbally agreed/ opted for EMMI calls to follow-up with her recovery at home. Referral made to [place] calls after discharge.  Patientvoiced understanding to seek referral from primary care provider to [facility] care management if necessary and appropriate for services in the future.  [facility] care management information provided for future needs that she may have.  Attempt to call primary care provider's office to notify of patient's discharge disposition and need for post hospital follow-up and transition of care but office is already close for the day.   For additional questions please contact:  Edwena Felty A. Verlyn Lambert, BSN, RN-BC [facility] PRIMARY CARE Navigator Cell: [phone]

## 2017-09-22 NOTE — Progress Notes (Signed)
Subjective:  Doing well.  Denies any chest pain or shortness of breath.  Denies further episodes of dizziness or palpitations.  remains in sinus rhythm.  Objective:  Vital Signs in the last 24 hours: Temp:  [98.3 F (36.8 C)-99.2 F (37.3 C)] 98.7 F (37.1 C) (10/19 0700) Pulse Rate:  [70-83] 75 (10/19 0300) Resp:  [20] 20 (10/19 0700) BP: (118-142)/(49-71) 142/71 (10/19 0300) SpO2:  [97 %-99 %] 97 % (10/19 0700) Weight:  [45.4 kg (100 lb 1.6 oz)] 45.4 kg (100 lb 1.6 oz) (10/19 0300)  Intake/Output from previous day: 10/18 0701 - 10/19 0700 In: 268.5 [P.O.:125; I.V.:93.5; IV Piggyback:50] Out: -  Intake/Output from this shift: No intake/output data recorded.  Physical Exam: Neck: no adenopathy, no carotid bruit, no JVD and supple, symmetrical, trachea midline Lungs: clear to auscultation bilaterally Heart: regular rate and rhythm, S1, S2 normal and soft systolic murmur noted Abdomen: soft, non-tender; bowel sounds normal; no masses,  no organomegaly Extremities: extremities normal, atraumatic, no cyanosis or edema  Lab Results:  Recent Labs  09/19/17 1118 09/21/17 0611  WBC 5.7 6.7  HGB 10.7* 10.8*  PLT 131* 140*    Recent Labs  09/19/17 1118  NA 137  K 3.7  CL 103  CO2 26  GLUCOSE 98  BUN 18  CREATININE 1.38*    Recent Labs  09/19/17 1118 09/19/17 1600  TROPONINI <0.03 <0.03   Hepatic Function Panel  Recent Labs  09/19/17 1118  PROT 6.0*  ALBUMIN 3.3*  AST 19  ALT 10*  ALKPHOS 45  BILITOT 0.4   No results for input(s): CHOL in the last 72 hours. No results for input(s): PROTIME in the last 72 hours.  Imaging: Imaging results have been reviewed and No results found.  Cardiac Studies:  Assessment/Plan:  Status post paroxysmal A. fib with RVR. Chadsvascscore of 6 Hypertension Hypothyroidism Hyperlipidemia Prediabetic Remote very small right cerebellar infarct History of carcinoma of breast History of uterine carcinoma Plan Reduce  amiodarone to 200 mg daily. Okay to discharge from cardiac point of view. Follow-up with me in one week  LOS: 2 days    Brooke Conley 09/22/2017, 8:44 AM

## 2017-09-22 NOTE — Discharge Summary (Signed)
Physician Discharge Summary  Brooke Conley JKK:[id number] DOB: [birth year] DOA: 09/18/2017  PCP: [name], MD  Admit date: 09/18/2017 Discharge date: 09/22/2017  Admitted From: home Disposition:  Home  Recommendations for Outpatient Follow-up:  1. Follow up with PCP in 1- week 2. Patient placed on metoprolol and amiodarone 3. Anticoagulation with apixaban 4. Discontinue triamterene hydorchlorothiazide  Home Health: Yes  Equipment/Devices:    Discharge Condition: Stable CODE STATUS: Full  Diet recommendation: Heart healthy  Brief/Interim Summary: 83 yo female who presented with the chief complain of weakness. Patient has the past medical history of hypertension, hypothyroidism, breast and uterine cancer. She felt severe weakness while ambulating, to the pint that she had to seat on the floor. On her initial physical examination blood pressure 156/62, HR 74 and oxygen saturation 100%. Moist mucous membranes, lungs were clear to auscultation, heart with S1 and S2 present, abdomen soft and nontender, no lower extremity edema and neurologically non focal. Her serum cr was 1,38 with K at 3,7 and Na at 137. Wbc 5,7 with hb 10,7 and hct 32, plt 131. ekg with first degree av block with left axis deviation and poor R wave progression. Head CT negative for acute changes.   Patient admitted to the hospital with the working diagnosis of weakness to to rule our near syncope.  1. Paroxysmal atrial fibrillation. Patient was admitted to the medical unit, placed on a remote telemetry unit. She did developed atrial fibrillation, she was placed on heparin and metoprolol and amiodarone.  Patient converted to sinus rhythm, was transitioned to apixaban. Echocardiography with preserved LV systolic function ejection fraction 50 to 55%. Follow outpatient with cardiology.   2. Hypothyroid. Will continue levothyroxine po per home regimen.  3. HTN. Traimterene -HZTC was discontinued and patient will be  continued with metoprolol for rate control.  4. History of breast cancer. On remission. Continue on tamoxifen.  5. History of CVA. MRI with remote small cerebellar infarct, will continue anticoagulation with apixaban and will hold on aspirin for now.   Discharge Diagnoses:  Principal Problem:   Syncope Active Problems:   Prediabetes   Hypertension   History of breast cancer   History of uterine cancer   Hypothyroid   Atrial fibrillation with RVR [facility])    Discharge Instructions  Discharge Instructions    Diet - low sodium heart healthy    Complete by:  As directed    Discharge instructions    Complete by:  As directed    Please follow up with primary care in 7 days   Face-to-face encounter (required for Medicare/Medicaid patients)    Complete by:  As directed    I Jimmy Picket Arrien certify that this patient is under my care and that I, or a nurse practitioner or physician's assistant working with me, had a face-to-face encounter that meets the physician face-to-face encounter requirements with this patient on 09/22/2017. The encounter with the patient was in whole, or in part for the following medical condition(s) which is the primary reason for home health care (List medical condition): Atrial fibrillation   The encounter with the patient was in whole, or in part, for the following medical condition, which is the primary reason for home health care:  atrial fibrillatin   I certify that, based on my findings, the following services are medically necessary home health services:   Nursing Physical therapy     Reason for Medically Necessary Home Health Services:   Skilled Nursing- Teaching of Disease Process/Symptom Management  Therapy- Therapeutic Exercises to Increase Strength and Endurance     My clinical findings support the need for the above services:  Unable to leave home safely without assistance and/or assistive device   Further, I certify that my clinical findings  support that this patient is homebound due to:  Unable to leave home safely without assistance   Home Health    Complete by:  As directed    To provide the following care/treatments:   PT OT Home Health Aide     Increase activity slowly    Complete by:  As directed      Allergies as of 09/22/2017      Reactions   Penicillins Rash   Has patient had a PCN reaction causing immediate rash, facial/tongue/throat swelling, SOB or lightheadedness with hypotension: No Has patient had a PCN reaction causing severe rash involving mucus membranes or skin necrosis: No Has patient had a PCN reaction that required hospitalization: No Has patient had a PCN reaction occurring within the last 10 years: No If all of the above answers are "NO", then may proceed with Cephalosporin use.      Medication List    STOP taking these medications   BABY ASPIRIN PO   triamterene-hydrochlorothiazide 37.5-25 MG tablet Commonly known as:  MAXZIDE-25     TAKE these medications   amiodarone 200 MG tablet Commonly known as:  PACERONE Take 1 tablet (200 mg total) by mouth 2 (two) times daily.   apixaban 2.5 MG Tabs tablet Commonly known as:  ELIQUIS Take 1 tablet (2.5 mg total) by mouth 2 (two) times daily.   Bilberry 1000 MG Caps Take 1 capsule by mouth daily.   Biotin 10 MG Caps Take 10 mg by mouth daily.   CALCIUM + D PO Take 1 tablet by mouth 2 (two) times daily. Takes calcium 600 mg and vitamin D 800 units BID   cyanocobalamin 500 MCG tablet Take 1 tablet (500 mcg total) by mouth daily.   Fish Oil 1000 MG Caps Take 1 capsule by mouth daily.   levothyroxine 50 MCG tablet Commonly known as:  SYNTHROID, LEVOTHROID Take 1 tablet by mouth  daily before breakfast What changed:  how much to take  additional instructions   metoprolol succinate 50 MG 24 hr tablet Commonly known as:  TOPROL-XL Take 1 tablet (50 mg total) by mouth daily. Take with or immediately following a meal.   tamoxifen  20 MG tablet Commonly known as:  NOLVADEX TAKE 1 TABLET BY MOUTH  EVERY DAY FOR OSTEOPOROSIS What changed:  See the new instructions.       Allergies  Allergen Reactions  . Penicillins Rash    Has patient had a PCN reaction causing immediate rash, facial/tongue/throat swelling, SOB or lightheadedness with hypotension: No Has patient had a PCN reaction causing severe rash involving mucus membranes or skin necrosis: No Has patient had a PCN reaction that required hospitalization: No Has patient had a PCN reaction occurring within the last 10 years: No If all of the above answers are "NO", then may proceed with Cephalosporin use.    Consultations:  Cardiology    Procedures/Studies: Dg Chest 2 View  Result Date: 09/18/2017 CLINICAL DATA:  83 y/o  F; syncope. EXAM: CHEST  2 VIEW COMPARISON:  08/15/2008 chest CT. FINDINGS: Normal cardiac silhouette given projection and technique. Aortic atherosclerosis with calcification. No pneumothorax or pleural effusion. Clear lungs. No acute osseous abnormality identified. Bilateral breast prostheses. IMPRESSION: No active cardiopulmonary disease.  Aortic  atherosclerosis. Electronically Signed   By: Kristine Garbe M.D.   On: 09/18/2017 21:54   Ct Head Wo Contrast  Result Date: 09/18/2017 CLINICAL DATA:  Altered mental status EXAM: CT HEAD WITHOUT CONTRAST TECHNIQUE: Contiguous axial images were obtained from the base of the skull through the vertex without intravenous contrast. COMPARISON:  None. FINDINGS: Brain: No large vessel territorial infarction or hemorrhage is visualized. Moderate atrophy. Mild small vessel ischemic changes of the white matter. Ventricle size within normal limits. Questionable asymmetric hypodensity within the right cerebellar white matter. Vascular: No hyperdense vessels. Calcifications at the carotid siphons. Skull: No fracture Sinuses/Orbits: No acute finding. Other: None IMPRESSION: 1. Questionable asymmetric  hypodensity/edema within the right cerebellar white matter, could further evaluate with MRI. Negative for hemorrhage. 2. Atrophy and mild small vessel ischemic changes of the white matter. Electronically Signed   By: Donavan Foil M.D.   On: 09/18/2017 22:02   Mr Brain Wo Contrast  Result Date: 09/19/2017 CLINICAL DATA:  Initial evaluation for acute altered mental status, syncope, fall. EXAM: MRI HEAD WITHOUT CONTRAST TECHNIQUE: Multiplanar, multiecho pulse sequences of the brain and surrounding structures were obtained without intravenous contrast. COMPARISON:  Prior CT from 09/18/2017. FINDINGS: Brain: Diffuse prominence of the CSF containing spaces compatible with generalized age related cerebral atrophy. Patchy and confluent T2/FLAIR hyperintensity within the periventricular and deep white matter both cerebral hemispheres most consistent with chronic small vessel ischemic disease. Chronic microvascular ischemic changes present within the pons. Overall, findings are mild for age. Small remote right cerebellar infarct noted. No abnormal foci of restricted diffusion to suggest acute or subacute ischemia. Gray-white matter differentiation maintained. No other areas of chronic infarction. No evidence for acute intracranial hemorrhage. No mass lesion, midline shift or mass effect. No hydrocephalus. No extra-axial fluid collection. Major dural sinuses are grossly patent. Pituitary gland and suprasellar region normal. Midline structures intact and normal. Vascular: Major intracranial vascular flow voids are maintained. Skull and upper cervical spine: Degenerative thickening noted at the tectorial membrane with mild narrowing at the craniocervical junction. Visualized upper cervical spine within normal limits. Bone marrow signal intensity normal. No scalp soft tissue abnormality. Sinuses/Orbits: Globes and orbital soft tissues within normal limits. Patient status post lens extraction bilaterally. Paranasal sinuses  are clear. No mastoid effusion. Inner ear structures normal. Other: None. IMPRESSION: 1. No acute intracranial abnormality identified. 2. Small remote right cerebellar infarct. 3. Mild age-related cerebral atrophy with chronic small vessel ischemic disease. Electronically Signed   By: Jeannine Boga M.D.   On: 09/19/2017 02:14       Subjective: Patient feeling better, no nausea or vomiting, out of bed and ambulating well.   Discharge Exam: Vitals:   09/22/17 1100 09/22/17 1150  BP:  129/60  Pulse:    Resp:    Temp: 98.6 F (37 C)   SpO2: 97%    Vitals:   09/22/17 0700 09/22/17 1017 09/22/17 1100 09/22/17 1150  BP:  (!) 129/46  129/60  Pulse:  72    Resp: 20 16    Temp: 98.7 F (37.1 C)  98.6 F (37 C)   TempSrc: Oral  Oral   SpO2: 97%  97%   Weight:      Height:        General: Pt is alert, awake, not in acute distress E ENT. No pallor or icterus, oral mucosa moist. Cardiovascular: RRR, S1/S2 +, no rubs, no gallops Respiratory: CTA bilaterally, no wheezing, no rhonchi Abdominal: Soft, NT, ND, bowel sounds +  Extremities: no edema, no cyanosis    The results of significant diagnostics from this hospitalization (including imaging, microbiology, ancillary and laboratory) are listed below for reference.     Microbiology: No results found for this or any previous visit (from the past 240 hour(s)).   Labs: BNP (last 3 results) No results for input(s): BNP in the last 8760 hours. Basic Metabolic Panel:  Recent Labs Lab 09/18/17 1959 09/19/17 1118 09/20/17 1009  NA 133* 137  --   K 3.7 3.7  --   CL 98* 103  --   CO2 25 26  --   GLUCOSE 119* 98  --   BUN 17 18  --   CREATININE 1.56* 1.38*  --   CALCIUM 8.9 8.6*  --   MG  --  1.7 1.7  PHOS  --  2.8  --    Liver Function Tests:  Recent Labs Lab 09/18/17 1959 09/19/17 1118  AST 19 19  ALT 9* 10*  ALKPHOS 44 45  BILITOT 0.5 0.4  PROT 6.6 6.0*  ALBUMIN 3.7 3.3*   No results for input(s):  LIPASE, AMYLASE in the last 168 hours. No results for input(s): AMMONIA in the last 168 hours. CBC:  Recent Labs Lab 09/18/17 1959 09/19/17 1118 09/21/17 0611  WBC 6.7 5.7 6.7  NEUTROABS 5.5 3.9  --   HGB 11.3* 10.7* 10.8*  HCT 34.6* 32.5* 33.1*  MCV 90.3 91.0 91.9  PLT 127* 131* 140*   Cardiac Enzymes:  Recent Labs Lab 09/18/17 1959 09/19/17 0458 09/19/17 1118 09/19/17 1600  TROPONINI <0.03 <0.03 <0.03 <0.03   BNP: Invalid input(s): POCBNP CBG: No results for input(s): GLUCAP in the last 168 hours. D-Dimer No results for input(s): DDIMER in the last 72 hours. Hgb A1c No results for input(s): HGBA1C in the last 72 hours. Lipid Profile No results for input(s): CHOL, HDL, LDLCALC, TRIG, CHOLHDL, LDLDIRECT in the last 72 hours. Thyroid function studies  Recent Labs  09/20/17 1009  TSH 3.548   Anemia work up  Recent Labs  09/20/17 1009  VITAMINB12 297   Urinalysis    Component Value Date/Time   COLORURINE YELLOW 09/18/2017 2115   APPEARANCEUR CLEAR 09/18/2017 2115   LABSPEC 1.010 09/18/2017 2115   LABSPEC 1.010 05/09/2006 1305   PHURINE 7.0 09/18/2017 2115   GLUCOSEU NEGATIVE 09/18/2017 2115   HGBUR NEGATIVE 09/18/2017 2115   BILIRUBINUR NEGATIVE 09/18/2017 2115   BILIRUBINUR Negative 05/09/2006 [place] 09/18/2017 2115   PROTEINUR 100 (A) 09/18/2017 2115   UROBILINOGEN 0.2 09/11/2014 1527   NITRITE NEGATIVE 09/18/2017 2115   LEUKOCYTESUR NEGATIVE 09/18/2017 2115   LEUKOCYTESUR Negative 05/09/2006 1305   Sepsis Labs Invalid input(s): PROCALCITONIN,  WBC,  LACTICIDVEN Microbiology No results found for this or any previous visit (from the past 240 hour(s)).   Time coordinating discharge: 45 minutes  SIGNED:   Tawni Millers, MD  [facility] 09/22/2017, 1:49 PM Pager [phone]  If 7PM-7AM, please contact night-coverage [email or web address] Password [email or web address]

## 2017-09-22 NOTE — Progress Notes (Signed)
Patient for discharge home accompanied by her son with no apparent distress noted. Medications and discharge instructions explained to the patient and her son. They verbalized understanding. Copies given to the patient. IV saline lock and tele pack removed. CCMD notified.

## 2017-09-27 ENCOUNTER — Other Ambulatory Visit: Payer: Self-pay

## 2017-09-27 NOTE — Patient Outreach (Signed)
[facility]) Care Management  09/27/2017  Brooke Conley 01/19/1927 [id number]   EMMI: General Discharge Referral Date: 09/27/17 Referral Source: EMMI Referral Reason: Scheduled Follow up-no Day # 1   Outreach attempt # 1.  No answer and unable to leave a message.   Plan: RN CM will attempt patient again within one business day.   [name], RN, MSN [facility] Care Management Care Management Coordinator Direct Line [phone] Toll Free: [phone]  Fax: [phone]

## 2017-09-28 ENCOUNTER — Other Ambulatory Visit: Payer: Self-pay

## 2017-09-28 NOTE — Patient Outreach (Signed)
[facility]) Care Management  09/28/2017  Brooke Conley November 11, 1927 [id number]   EMMI: General Discharge Referral Date: 09/27/17 Referral Source: EMMI Referral Reason: Scheduled Follow up-no Day # 1   Outreach attempt # 1.  No answer and unable to leave a message.   Plan: RN CM will send letter to attempt outreach. If no response within 10 business days.  Will proceed with case closure.    [name], RN, MSN [facility] Care Management Care Management Coordinator Direct Line [phone] Toll Free: [phone]  Fax: [phone]

## 2017-10-02 ENCOUNTER — Telehealth: Payer: Self-pay | Admitting: *Deleted

## 2017-10-02 NOTE — Telephone Encounter (Signed)
Tried calling patient last week & again today to schedule a hosp f/u there is no answer & no machine =sb

## 2017-10-09 ENCOUNTER — Other Ambulatory Visit: Payer: Self-pay | Admitting: Internal Medicine

## 2017-10-12 ENCOUNTER — Other Ambulatory Visit: Payer: Self-pay

## 2017-10-12 NOTE — Patient Outreach (Signed)
[place] [facility]) Care Management  10/12/2017  [name] 20-Mar-1927 [id number]   Multiple attempts to establish contact with patient without success. No response from letter mailed to patient. Case is being closed at this time.   Plan: RN CM will notify [facility] administrative assistant of case status.  [name], RN, MSN [facility] Care Management Care Management Coordinator Direct Line [phone] Toll Free: [phone]  Fax: [phone]

## 2017-11-29 ENCOUNTER — Other Ambulatory Visit: Payer: Self-pay | Admitting: *Deleted

## 2017-11-29 MED ORDER — AMIODARONE HCL 200 MG PO TABS: 200.0000 mg | ORAL_TABLET | Freq: Two times a day (BID) | ORAL | 0 refills | Status: DC

## 2017-11-29 MED ORDER — METOPROLOL SUCCINATE ER 50 MG PO TB24: 50.0000 mg | ORAL_TABLET | Freq: Every day | ORAL | 0 refills | Status: DC

## 2017-11-29 MED ORDER — APIXABAN 2.5 MG PO TABS: 2.5000 mg | ORAL_TABLET | Freq: Two times a day (BID) | ORAL | 0 refills | Status: DC

## 2017-12-04 ENCOUNTER — Ambulatory Visit: Payer: Self-pay | Admitting: Physician Assistant

## 2017-12-04 NOTE — Progress Notes (Deleted)
MEDICARE ANNUAL WELLNESS VISIT AND FOLLOW UP  Assessment:    Over 40 minutes of exam, counseling, chart review and critical decision making was performed Future Appointments  Date Time Provider [place]  12/04/2017  2:45 PM Vicie Mutters, PA-C [facility] None  10/01/2018  2:00 PM [name], MD [facility] None     Plan:   During the course of the visit the patient was educated and counseled about appropriate screening and preventive services including:    Pneumococcal vaccine   Prevnar 13  Influenza vaccine  Td vaccine  Screening electrocardiogram  Bone densitometry screening  Colorectal cancer screening  Diabetes screening  Glaucoma screening  Nutrition counseling   Advanced directives: requested   Subjective:  Brooke Conley is a 83 y.o. female who presents for Medicare Annual Wellness Visit and 3 month follow up.   She has had elevated blood pressure for *** years. Her blood pressure {HAS HAS NOT:18834} been controlled at home, today their BP is   She {DOES_DOES KKX:38182} workout. She denies chest pain, shortness of breath, dizziness.  She {ACTION; IS/IS [id number]} on cholesterol medication and denies myalgias. Her cholesterol {ACTION; IS/IS FYB:[id number]} at goal. The cholesterol last visit was:   Lab Results  Component Value Date   CHOL 161 07/26/2016   HDL 99 07/26/2016   LDLCALC 46 07/26/2016   TRIG 82 07/26/2016   CHOLHDL 1.6 07/26/2016   She has had diabetes for *** years. She {Has/has not:18111} been working on diet and exercise for ***prediabetes, and denies {Symptoms; diabetes w/o none:19199}. Last A1C in the office was:  Lab Results  Component Value Date   HGBA1C 5.4 08/31/2017   Last GFR:   Lab Results  Component Value Date   GFRNONAA 33 (L) 09/19/2017   Lab Results  Component Value Date   GFRAA 38 (L) 09/19/2017   Patient is on Vitamin D supplement.   Lab Results  Component Value Date   VD25OH 38 08/31/2017       Medication Review: Current Outpatient Medications on File Prior to Visit  Medication Sig Dispense Refill  . amiodarone (PACERONE) 200 MG tablet Take 1 tablet (200 mg total) by mouth 2 (two) times daily. 60 tablet 0  . apixaban (ELIQUIS) 2.5 MG TABS tablet Take 1 tablet (2.5 mg total) by mouth 2 (two) times daily. 60 tablet 0  . Bilberry 1000 MG CAPS Take 1 capsule by mouth daily.    . Biotin 10 MG CAPS Take 10 mg by mouth daily.    . Calcium Citrate-Vitamin D (CALCIUM + D PO) Take 1 tablet by mouth 2 (two) times daily. Takes calcium 600 mg and vitamin D 800 units BID    . levothyroxine (SYNTHROID, LEVOTHROID) 50 MCG tablet Take 1 tablet by mouth  daily before breakfast (Patient taking differently: 0.25 mcg. Take 1 tablet by mouth  daily before breakfast) 90 tablet 1  . metoprolol succinate (TOPROL-XL) 50 MG 24 hr tablet Take 1 tablet (50 mg total) by mouth daily. Take with or immediately following a meal. 30 tablet 0  . Omega-3 Fatty Acids (FISH OIL) 1000 MG CAPS Take 1 capsule by mouth daily.    . tamoxifen (NOLVADEX) 20 MG tablet TAKE 1 TABLET BY MOUTH  EVERY DAY FOR OSTEOPOROSIS (Patient taking differently: TAKE 20 mg TABLET BY MOUTH  EVERY DAY FOR OSTEOPOROSIS) 90 tablet 1  . tamoxifen (NOLVADEX) 20 MG tablet TAKE 1 TABLET BY MOUTH  EVERY DAY FOR OSTEOPOROSIS 90 tablet 1   No current facility-administered  medications on file prior to visit.     Allergies  Allergen Reactions  . Penicillins Rash    Current Problems (verified) Patient Active Problem List   Diagnosis Date Noted  . Atrial fibrillation with RVR ([place]) 09/20/2017  . Syncope 09/19/2017  . Encounter for general adult medical examination with abnormal findings 07/26/2016  . Hypothyroid 07/06/2015  . Medicare annual wellness visit, initial 07/06/2015  . Medication management 06/26/2014  . Prediabetes   . Hypertension   . Hyperlipidemia   . Osteoporosis   . History of breast cancer   . Vitamin D deficiency   .  History of uterine cancer     Screening Tests Immunization History  Administered Date(s) Administered  . Influenza Split 09/11/2014  . Influenza, High Dose Seasonal PF 07/26/2016, 08/31/2017  . Influenza-Unspecified 08/05/2014, 09/25/2015  . Pneumococcal Conjugate-13 07/06/2015  . Pneumococcal Polysaccharide-23 08/31/2017  . Td 06/24/2013  . Zoster 12/06/2011   Preventative care: Last colonoscopy: *** Last mammogram: *** Last pap smear/pelvic exam: ***   DEXA:***  Prior vaccinations: TD or Tdap: ***  Influenza: ***  Pneumococcal: *** Prevnar13:  Shingles/Zostavax: **  Names of Other Physician/Practitioners you currently use: 1. [facility] here for primary care 2. ***, eye doctor, last visit *** 3. ***, dentist, last visit *** Patient Care Team: [name], MD as PCP - General (Internal Medicine) Magrinat, Virgie Dad, MD as Consulting Physician (Oncology) Franchot Gallo, MD as Consulting Physician (Urology) Lavonna Monarch, MD as Consulting Physician (Dermatology)  SURGICAL HISTORY She  has a past surgical history that includes Cosmetic surgery (Bilateral, 1979); Breast surgery (Right, 2007); Tonsillectomy; Cataract extraction (Right, 2010); and Total abdominal hysterectomy w/ bilateral salpingoophorectomy. FAMILY HISTORY Her family history includes Cancer in her mother; Hypertension in her father. SOCIAL HISTORY She  reports that  has never smoked. she has never used smokeless tobacco. She reports that she does not drink alcohol or use drugs.   MEDICARE WELLNESS OBJECTIVES: Physical activity:   Cardiac risk factors:   Depression/mood screen:   Depression screen [facility] 2/9 08/31/2017  Decreased Interest 0  Down, Depressed, Hopeless 0  PHQ - 2 Score 0    ADLs:  In your present state of health, do you have any difficulty performing the following activities: 09/19/2017 08/31/2017  Hearing? Y N  Vision? N N  Difficulty  concentrating or making decisions? N N  Walking or climbing stairs? N N  Dressing or bathing? N N  Doing errands, shopping? N N  Some recent data might be hidden     Cognitive Testing  Alert? Yes  Normal Appearance?Yes  Oriented to person? Yes  Place? Yes   Time? Yes  Recall of three objects?  Yes  Can perform simple calculations? Yes  Displays appropriate judgment?Yes  Can read the correct time from a watch face?Yes  EOL planning:    ROS   Objective:     There were no vitals filed for this visit. There is no height or weight on file to calculate BMI.  General appearance: alert, no distress, WD/WN, female HEENT: normocephalic, sclerae anicteric, TMs pearly, nares patent, no discharge or erythema, pharynx normal Oral cavity: MMM, no lesions Neck: supple, no lymphadenopathy, no thyromegaly, no masses Heart: RRR, normal S1, S2, no murmurs Lungs: CTA bilaterally, no wheezes, rhonchi, or rales Abdomen: +bs, soft, non tender, non distended, no masses, no hepatomegaly, no splenomegaly Musculoskeletal: nontender, no swelling, no obvious deformity Extremities: no edema, no cyanosis, no clubbing Pulses: 2+ symmetric,  upper and lower extremities, normal cap refill Neurological: alert, oriented x 3, CN2-12 intact, strength normal upper extremities and lower extremities, sensation normal throughout, DTRs 2+ throughout, no cerebellar signs, gait normal Psychiatric: normal affect, behavior normal, pleasant   Medicare Attestation I have personally reviewed: The patient's medical and social history Their use of alcohol, tobacco or illicit drugs Their current medications and supplements The patient's functional ability including ADLs,fall risks, home safety risks, cognitive, and hearing and visual impairment Diet and physical activities Evidence for depression or mood disorders  The patient's weight, height, BMI, and visual acuity have been recorded in the chart.  I have made referrals,  counseling, and provided education to the patient based on review of the above and I have provided the patient with a written personalized care plan for preventive services.     Vicie Mutters, PA-C   12/04/2017

## 2018-02-06 ENCOUNTER — Other Ambulatory Visit: Payer: Self-pay | Admitting: *Deleted

## 2018-02-06 MED ORDER — AMIODARONE HCL 200 MG PO TABS: 200.0000 mg | ORAL_TABLET | Freq: Two times a day (BID) | ORAL | 0 refills | Status: DC

## 2018-02-06 MED ORDER — METOPROLOL SUCCINATE ER 50 MG PO TB24: 50.0000 mg | ORAL_TABLET | Freq: Every day | ORAL | 0 refills | Status: AC

## 2018-02-08 ENCOUNTER — Other Ambulatory Visit: Payer: Self-pay | Admitting: *Deleted

## 2018-02-08 MED ORDER — APIXABAN 2.5 MG PO TABS: 2.5000 mg | ORAL_TABLET | Freq: Two times a day (BID) | ORAL | 0 refills | Status: AC

## 2018-02-14 ENCOUNTER — Ambulatory Visit: Payer: Self-pay | Admitting: Physician Assistant

## 2018-02-14 ENCOUNTER — Ambulatory Visit: Payer: Self-pay | Admitting: Internal Medicine

## 2018-02-24 ENCOUNTER — Other Ambulatory Visit: Payer: Self-pay | Admitting: Internal Medicine

## 2018-03-03 ENCOUNTER — Emergency Department (HOSPITAL_COMMUNITY): Payer: Medicare Other

## 2018-03-03 ENCOUNTER — Inpatient Hospital Stay (HOSPITAL_COMMUNITY): Payer: Medicare Other

## 2018-03-03 ENCOUNTER — Encounter (HOSPITAL_COMMUNITY): Payer: Self-pay | Admitting: Emergency Medicine

## 2018-03-03 ENCOUNTER — Inpatient Hospital Stay (HOSPITAL_COMMUNITY)
Admission: EM | Admit: 2018-03-03 | Discharge: 2018-04-04 | DRG: 199 | Disposition: E | Payer: Medicare Other | Attending: Family Medicine | Admitting: Family Medicine

## 2018-03-03 DIAGNOSIS — R627 Adult failure to thrive: Secondary | ICD-10-CM | POA: Diagnosis not present

## 2018-03-03 DIAGNOSIS — S42032A Displaced fracture of lateral end of left clavicle, initial encounter for closed fracture: Secondary | ICD-10-CM | POA: Diagnosis not present

## 2018-03-03 DIAGNOSIS — N183 Chronic kidney disease, stage 3 unspecified: Secondary | ICD-10-CM

## 2018-03-03 DIAGNOSIS — Z9181 History of falling: Secondary | ICD-10-CM

## 2018-03-03 DIAGNOSIS — S42002A Fracture of unspecified part of left clavicle, initial encounter for closed fracture: Secondary | ICD-10-CM

## 2018-03-03 DIAGNOSIS — T82598A Other mechanical complication of other cardiac and vascular devices and implants, initial encounter: Secondary | ICD-10-CM

## 2018-03-03 DIAGNOSIS — R946 Abnormal results of thyroid function studies: Secondary | ICD-10-CM | POA: Diagnosis not present

## 2018-03-03 DIAGNOSIS — F4321 Adjustment disorder with depressed mood: Secondary | ICD-10-CM

## 2018-03-03 DIAGNOSIS — E43 Unspecified severe protein-calorie malnutrition: Secondary | ICD-10-CM | POA: Diagnosis not present

## 2018-03-03 DIAGNOSIS — E039 Hypothyroidism, unspecified: Secondary | ICD-10-CM | POA: Diagnosis present

## 2018-03-03 DIAGNOSIS — I10 Essential (primary) hypertension: Secondary | ICD-10-CM | POA: Diagnosis not present

## 2018-03-03 DIAGNOSIS — J440 Chronic obstructive pulmonary disease with acute lower respiratory infection: Secondary | ICD-10-CM | POA: Diagnosis present

## 2018-03-03 DIAGNOSIS — S270XXA Traumatic pneumothorax, initial encounter: Secondary | ICD-10-CM | POA: Diagnosis not present

## 2018-03-03 DIAGNOSIS — Z66 Do not resuscitate: Secondary | ICD-10-CM

## 2018-03-03 DIAGNOSIS — Z515 Encounter for palliative care: Secondary | ICD-10-CM

## 2018-03-03 DIAGNOSIS — S0990XA Unspecified injury of head, initial encounter: Secondary | ICD-10-CM | POA: Diagnosis not present

## 2018-03-03 DIAGNOSIS — E785 Hyperlipidemia, unspecified: Secondary | ICD-10-CM | POA: Diagnosis not present

## 2018-03-03 DIAGNOSIS — S299XXA Unspecified injury of thorax, initial encounter: Secondary | ICD-10-CM | POA: Diagnosis not present

## 2018-03-03 DIAGNOSIS — Z8542 Personal history of malignant neoplasm of other parts of uterus: Secondary | ICD-10-CM

## 2018-03-03 DIAGNOSIS — R7303 Prediabetes: Secondary | ICD-10-CM | POA: Diagnosis present

## 2018-03-03 DIAGNOSIS — Z8249 Family history of ischemic heart disease and other diseases of the circulatory system: Secondary | ICD-10-CM

## 2018-03-03 DIAGNOSIS — G9341 Metabolic encephalopathy: Secondary | ICD-10-CM | POA: Diagnosis not present

## 2018-03-03 DIAGNOSIS — Z79899 Other long term (current) drug therapy: Secondary | ICD-10-CM

## 2018-03-03 DIAGNOSIS — T17908A Unspecified foreign body in respiratory tract, part unspecified causing other injury, initial encounter: Secondary | ICD-10-CM

## 2018-03-03 DIAGNOSIS — S27329A Contusion of lung, unspecified, initial encounter: Secondary | ICD-10-CM | POA: Diagnosis not present

## 2018-03-03 DIAGNOSIS — S2232XA Fracture of one rib, left side, initial encounter for closed fracture: Secondary | ICD-10-CM | POA: Diagnosis not present

## 2018-03-03 DIAGNOSIS — Z853 Personal history of malignant neoplasm of breast: Secondary | ICD-10-CM

## 2018-03-03 DIAGNOSIS — M810 Age-related osteoporosis without current pathological fracture: Secondary | ICD-10-CM | POA: Diagnosis present

## 2018-03-03 DIAGNOSIS — W19XXXA Unspecified fall, initial encounter: Secondary | ICD-10-CM | POA: Diagnosis present

## 2018-03-03 DIAGNOSIS — E559 Vitamin D deficiency, unspecified: Secondary | ICD-10-CM | POA: Diagnosis present

## 2018-03-03 DIAGNOSIS — R0602 Shortness of breath: Secondary | ICD-10-CM | POA: Diagnosis not present

## 2018-03-03 DIAGNOSIS — S2242XA Multiple fractures of ribs, left side, initial encounter for closed fracture: Secondary | ICD-10-CM | POA: Diagnosis not present

## 2018-03-03 DIAGNOSIS — R1310 Dysphagia, unspecified: Secondary | ICD-10-CM | POA: Diagnosis present

## 2018-03-03 DIAGNOSIS — Z681 Body mass index (BMI) 19 or less, adult: Secondary | ICD-10-CM | POA: Diagnosis not present

## 2018-03-03 DIAGNOSIS — J9601 Acute respiratory failure with hypoxia: Secondary | ICD-10-CM | POA: Diagnosis not present

## 2018-03-03 DIAGNOSIS — I480 Paroxysmal atrial fibrillation: Secondary | ICD-10-CM | POA: Diagnosis present

## 2018-03-03 DIAGNOSIS — R0603 Acute respiratory distress: Secondary | ICD-10-CM | POA: Diagnosis not present

## 2018-03-03 DIAGNOSIS — R339 Retention of urine, unspecified: Secondary | ICD-10-CM | POA: Diagnosis not present

## 2018-03-03 DIAGNOSIS — Z7901 Long term (current) use of anticoagulants: Secondary | ICD-10-CM

## 2018-03-03 DIAGNOSIS — Z7189 Other specified counseling: Secondary | ICD-10-CM | POA: Diagnosis not present

## 2018-03-03 DIAGNOSIS — J90 Pleural effusion, not elsewhere classified: Secondary | ICD-10-CM | POA: Diagnosis not present

## 2018-03-03 DIAGNOSIS — R0989 Other specified symptoms and signs involving the circulatory and respiratory systems: Secondary | ICD-10-CM

## 2018-03-03 DIAGNOSIS — Z79810 Long term (current) use of selective estrogen receptor modulators (SERMs): Secondary | ICD-10-CM

## 2018-03-03 DIAGNOSIS — Z880 Allergy status to penicillin: Secondary | ICD-10-CM

## 2018-03-03 DIAGNOSIS — R55 Syncope and collapse: Secondary | ICD-10-CM

## 2018-03-03 DIAGNOSIS — Z90710 Acquired absence of both cervix and uterus: Secondary | ICD-10-CM

## 2018-03-03 DIAGNOSIS — I1310 Hypertensive heart and chronic kidney disease without heart failure, with stage 1 through stage 4 chronic kidney disease, or unspecified chronic kidney disease: Secondary | ICD-10-CM | POA: Diagnosis present

## 2018-03-03 DIAGNOSIS — Z452 Encounter for adjustment and management of vascular access device: Secondary | ICD-10-CM | POA: Diagnosis not present

## 2018-03-03 DIAGNOSIS — R4020 Unspecified coma: Secondary | ICD-10-CM | POA: Diagnosis not present

## 2018-03-03 DIAGNOSIS — R404 Transient alteration of awareness: Secondary | ICD-10-CM | POA: Diagnosis not present

## 2018-03-03 DIAGNOSIS — T82898A Other specified complication of vascular prosthetic devices, implants and grafts, initial encounter: Secondary | ICD-10-CM | POA: Diagnosis not present

## 2018-03-03 DIAGNOSIS — J189 Pneumonia, unspecified organism: Secondary | ICD-10-CM | POA: Diagnosis present

## 2018-03-03 DIAGNOSIS — J9811 Atelectasis: Secondary | ICD-10-CM | POA: Diagnosis not present

## 2018-03-03 DIAGNOSIS — Z801 Family history of malignant neoplasm of trachea, bronchus and lung: Secondary | ICD-10-CM

## 2018-03-03 DIAGNOSIS — Z79890 Hormone replacement therapy (postmenopausal): Secondary | ICD-10-CM

## 2018-03-03 DIAGNOSIS — J939 Pneumothorax, unspecified: Secondary | ICD-10-CM | POA: Diagnosis present

## 2018-03-03 DIAGNOSIS — I4891 Unspecified atrial fibrillation: Secondary | ICD-10-CM | POA: Diagnosis not present

## 2018-03-03 LAB — BASIC METABOLIC PANEL WITH GFR
Anion gap: 13 (ref 5–15)
BUN: 26 mg/dL — ABNORMAL HIGH (ref 6–20)
CO2: 24 mmol/L (ref 22–32)
Calcium: 9.2 mg/dL (ref 8.9–10.3)
Chloride: 99 mmol/L — ABNORMAL LOW (ref 101–111)
Creatinine, Ser: 1.7 mg/dL — ABNORMAL HIGH (ref 0.44–1.00)
GFR calc Af Amer: 30 mL/min — ABNORMAL LOW
GFR calc non Af Amer: 26 mL/min — ABNORMAL LOW
Glucose, Bld: 161 mg/dL — ABNORMAL HIGH (ref 65–99)
Potassium: 4.1 mmol/L (ref 3.5–5.1)
Sodium: 136 mmol/L (ref 135–145)

## 2018-03-03 LAB — CBC
HCT: 37.1 % (ref 36.0–46.0)
Hemoglobin: 12.2 g/dL (ref 12.0–15.0)
MCH: 31.2 pg (ref 26.0–34.0)
MCHC: 32.9 g/dL (ref 30.0–36.0)
MCV: 94.9 fL (ref 78.0–100.0)
PLATELETS: 146 10*3/uL — AB (ref 150–400)
RBC: 3.91 MIL/uL (ref 3.87–5.11)
RDW: 14.9 % (ref 11.5–15.5)
WBC: 10.7 10*3/uL — AB (ref 4.0–10.5)

## 2018-03-03 LAB — CBG MONITORING, ED: Glucose-Capillary: 178 mg/dL — ABNORMAL HIGH (ref 65–99)

## 2018-03-03 LAB — MAGNESIUM: MAGNESIUM: 2 mg/dL (ref 1.7–2.4)

## 2018-03-03 LAB — I-STAT TROPONIN, ED: TROPONIN I, POC: 0.02 ng/mL (ref 0.00–0.08)

## 2018-03-03 MED ORDER — HYDRALAZINE HCL 20 MG/ML IJ SOLN
10.0000 mg | Freq: Four times a day (QID) | INTRAMUSCULAR | Status: DC | PRN
  Administered 2018-03-04 – 2018-03-11 (×12): 10 mg via INTRAVENOUS
  Filled 2018-03-03 (×12): qty 1

## 2018-03-03 MED ORDER — FENTANYL CITRATE (PF) 100 MCG/2ML IJ SOLN
50.0000 ug | Freq: Once | INTRAMUSCULAR | Status: AC
  Administered 2018-03-03: 50 ug via INTRAVENOUS

## 2018-03-03 MED ORDER — LIDOCAINE HCL (PF) 1 % IJ SOLN
10.0000 mL | Freq: Once | INTRAMUSCULAR | Status: AC
  Administered 2018-03-03: 10 mL
  Filled 2018-03-03: qty 10

## 2018-03-03 MED ORDER — FENTANYL CITRATE (PF) 100 MCG/2ML IJ SOLN
INTRAMUSCULAR | Status: AC
  Administered 2018-03-03: 50 ug via INTRAVENOUS
  Filled 2018-03-03: qty 2

## 2018-03-03 MED ORDER — LABETALOL HCL 5 MG/ML IV SOLN
10.0000 mg | Freq: Once | INTRAVENOUS | Status: DC
  Filled 2018-03-03: qty 4

## 2018-03-03 MED ORDER — HYDROCODONE-ACETAMINOPHEN 5-325 MG PO TABS
1.0000 | ORAL_TABLET | Freq: Once | ORAL | Status: AC
  Administered 2018-03-03: 1 via ORAL
  Filled 2018-03-03: qty 1

## 2018-03-03 NOTE — Consult Note (Signed)
Reason for Consult:ptx Referring Physician: dr Servando Snare Allmendinger is an 84 y.o. female.  HPI: 51yof who does not remember any of event (I think this is baseline) fell today. She has left shoulder pain.  She has history of fall recently requiring admission. She is on eliquis for afib.  She is repetitive during questioning and is not able to give good history. Her son is able to give some history. She underwent evaluation by the er and was found to have left rib fx, left clavicle fx and then a left ptx.  I was asked to see her for ptx.   Past Medical History:  Diagnosis Date  . History of breast cancer   . History of uterine cancer 1982  . Hyperlipidemia   . Hypertension   . Osteoporosis   . Prediabetes   . Vitamin D deficiency     Past Surgical History:  Procedure Laterality Date  . BREAST SURGERY Right 2007   lumpectomy  . CATARACT EXTRACTION Right 2010  . COSMETIC SURGERY Bilateral 1979   Breast  . TONSILLECTOMY    . TOTAL ABDOMINAL HYSTERECTOMY W/ BILATERAL SALPINGOOPHORECTOMY      Family History  Problem Relation Age of Onset  . Cancer Mother        lung  . Hypertension Father     Social History:  reports that she has never smoked. She has never used smokeless tobacco. She reports that she does not drink alcohol or use drugs.  Allergies:  Allergies  Allergen Reactions  . Penicillins Rash    Medications: I have reviewed the patient's current medications.  Results for orders placed or performed during the hospital encounter of 02/13/2018 (from the past 48 hour(s))  CBG monitoring, ED     Status: Abnormal   Collection Time: 02/05/2018  8:16 PM  Result Value Ref Range   Glucose-Capillary 178 (H) 65 - 99 mg/dL  Basic metabolic panel     Status: Abnormal   Collection Time: 02/12/2018  8:41 PM  Result Value Ref Range   Sodium 136 135 - 145 mmol/L   Potassium 4.1 3.5 - 5.1 mmol/L   Chloride 99 (L) 101 - 111 mmol/L   CO2 24 22 - 32 mmol/L   Glucose, Bld 161 (H) 65 -  99 mg/dL   BUN 26 (H) 6 - 20 mg/dL   Creatinine, Ser 1.70 (H) 0.44 - 1.00 mg/dL   Calcium 9.2 8.9 - 10.3 mg/dL   GFR calc non Af Amer 26 (L) >60 mL/min   GFR calc Af Amer 30 (L) >60 mL/min    Comment: (NOTE) The eGFR has been calculated using the CKD EPI equation. This calculation has not been validated in all clinical situations. eGFR's persistently <60 mL/min signify possible Chronic Kidney Disease.    Anion gap 13 5 - 15    Comment: Performed at [facility] [address] [place], [place] [address]  CBC     Status: Abnormal   Collection Time: 02/26/2018  8:41 PM  Result Value Ref Range   WBC 10.7 (H) 4.0 - 10.5 K/uL   RBC 3.91 3.87 - 5.11 MIL/uL   Hemoglobin 12.2 12.0 - 15.0 g/dL   HCT 37.1 36.0 - 46.0 %   MCV 94.9 78.0 - 100.0 fL   MCH 31.2 26.0 - 34.0 pg   MCHC 32.9 30.0 - 36.0 g/dL   RDW 14.9 11.5 - 15.5 %   Platelets 146 (L) 150 - 400 K/uL  Comment: Performed at [facility], [place] [address] [place], [place] [address]  I-Stat Troponin, ED (not at [facility])     Status: None   Collection Time: 02/02/2018  8:46 PM  Result Value Ref Range   Troponin i, poc 0.02 0.00 - 0.08 ng/mL   Comment 3            Comment: Due to the release kinetics of cTnI, a negative result within the first hours of the onset of symptoms does not rule out myocardial infarction with certainty. If myocardial infarction is still suspected, repeat the test at appropriate intervals.   Magnesium     Status: None   Collection Time: 02/10/2018  9:45 PM  Result Value Ref Range   Magnesium 2.0 1.7 - 2.4 mg/dL    Comment: Performed at [facility], [place] [address] [place], [place] [address]    Dg Clavicle Left  Addendum Date: 02/08/2018   ADDENDUM REPORT: 02/09/2018 23:15 ADDENDUM: These results were called by telephone at the time of interpretation on 03/04/2018 at 9:48 p.m. to Dr. Elnora Morrison , who verbally acknowledged these results. Electronically Signed   By: Fidela Salisbury M.D.    On: 02/28/2018 23:15   Result Date: 02/20/2018 CLINICAL DATA:  Status post fall with left shoulder pain. EXAM: LEFT CLAVICLE - 2+ VIEWS COMPARISON:  None. FINDINGS: Comminuted mildly inferiorly angulated fracture of the distal clavicle. Mild widening of the [facility] joint measuring 5 mm. No evidence of humeral or glenoid fracture. The glenohumeral joint is normally located. Displaced left fourth and fifth lateral rib fractures. Soft tissue pleural/subpleural thickening underneath the rib fractures may represent pulmonary contusion. Small pneumothorax cannot be excluded. Soft tissue emphysema along the left lateral chest wall. IMPRESSION: Comminuted mildly angulated distal clavicular fracture with widening of the AC joint. Displaced left fourth and fifth lateral rib fractures, with pleural subpleural contusion. Suspected small left pneumothorax. Dedicated chest radiograph, right-side-down may be considered for confirmation. Soft tissue emphysema along the left lateral chest wall. Electronically Signed: By: Fidela Salisbury M.D. On: 03/04/2018 21:27   Ct Head Wo Contrast  Result Date: 02/09/2018 CLINICAL DATA:  Head trauma while on Eliquis. EXAM: CT HEAD WITHOUT CONTRAST TECHNIQUE: Contiguous axial images were obtained from the base of the skull through the vertex without intravenous contrast. COMPARISON:  Brain MRI 09/19/2017 FINDINGS: Brain: No mass lesion, intraparenchymal hemorrhage or extra-axial collection. No evidence of acute cortical infarct. Minimal white matter hypoattenuation. 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 Vascular: No hyperdense vessel or unexpected vascular calcification. Skull: Normal visualized skull base, calvarium and extracranial soft tissues. Sinuses/Orbits: No sinus fluid levels or advanced mucosal thickening. No mastoid effusion. Normal orbits. IMPRESSION: Mild chronic small vessel disease without acute abnormality. Old right cerebellar infarct. Electronically Signed   By: Ulyses Jarred M.D.   On: 02/23/2018 20:33   Ct Chest Wo Contrast  Result Date: 02/28/2018 CLINICAL DATA:  Status post fall.  Chest pain. EXAM: CT CHEST WITHOUT CONTRAST TECHNIQUE: Multidetector CT imaging of the chest was performed following the standard protocol without IV contrast. COMPARISON:  Chest radiograph from the same date. FINDINGS: Cardiovascular: The mediastinal structures are displaced to the right. Mildly enlarged heart. Calcific atherosclerotic disease of the coronary arteries and aorta. Mediastinum/Nodes: No mediastinal lymph nodes. Lungs/Pleura: There is a large left pneumothorax, occupying approximately 40% of the volume of the left hemithorax, with evidence of tension effect. Pulmonary contusion is seen adjacent to the displaced fourth and fifth left lateral  rib fractures. There is a soft tissue thickening in the left apex and left lung base, measuring respectively 0.9 and 1.8 cm. More confluent subpleural soft tissue thickening with pleural calcifications is seen in the right lung apex measuring 1.7 x 5.1 cm. Upper Abdomen: No acute abnormality. Musculoskeletal: Displaced impacted left fourth and fifth lateral rib fractures, likely the cause of the left tension pneumothorax. Nondisplaced left third lateral rib fracture. Small soft tissue emphysema along the lateral left chest wall. Bilateral calcified breast implants. Comminuted left distal clavicular fracture with inferior angulation of the distal fracture fragment. IMPRESSION: Large left tension pneumothorax occupying approximately 40% of the volume of the left hemithorax. Pulmonary contusion underneath displaced fourth and fifth left lateral rib fractures. Additional nondisplaced left lateral third rib fracture. Small amount of left lateral chest wall emphysema. Confluent subpleural soft tissue thickening in the right lung apex. Patient has a history of treated right breast cancer, and these findings may represent post radiation changes.  Pulmonary malignancy cannot be entirely excluded. Non-contrast chest CT at 3-6 months is recommended. If the nodules are stable at time of repeat CT, then future CT at 18-24 months (from today's scan) is considered optional for low-risk patients, but is recommended for high-risk patients. This recommendation follows the consensus statement: Guidelines for Management of Incidental Pulmonary Nodules Detected on CT Images: From the Fleischner Society 2017; Radiology 2017; 284:228-243. Two areas of subpleural soft tissue thickening in the left lung apex and left lung base. Attention on future follow-up. Mildly enlarged heart. Calcific atherosclerotic disease of the coronary arteries and aorta. Comminuted slightly angulated distal left clavicular fracture. Critical Value/emergent results were called by telephone at the time of interpretation on 02/24/2018 at 10:50 pm to Dr. Elnora Morrison , who verbally acknowledged these results. Aortic Atherosclerosis (ICD10-I70.0). Electronically Signed   By: Fidela Salisbury M.D.   On: 02/13/2018 23:13   Dg Chest Portable 1 View  Result Date: 02/13/2018 CLINICAL DATA:  84 year old female with fall and left rib pain. EXAM: PORTABLE CHEST 1 VIEW COMPARISON:  Chest radiograph dated 09/18/2017 FINDINGS: The lungs are clear. There is no pleural effusion or pneumothorax. Biapical pleural thickening/scarring. The cardiac silhouette is within normal limits. Bilateral breast implants. There is osteopenia. There is apparent discontinuity of the left third and fourth ribs. A cardiac monitor lead overlies this area. Dedicated left rib series may provide better evaluation. There is a small subcutaneous air in the left chest wall. IMPRESSION: Fractures of the left third and fourth ribs. Electronically Signed   By: Anner Crete M.D.   On: 02/24/2018 21:24   Dg Shoulder Left Portable  Addendum Date: 02/08/2018   ADDENDUM REPORT: 02/24/2018 21:28 ADDENDUM: Displaced left fourth and fifth  lateral rib fractures, with pleural subpleural contusion. Suspected small left pneumothorax. Dedicated chest radiograph, right-side-down may be considered for confirmation. Soft tissue emphysema along the left lateral chest wall. Electronically Signed   By: Fidela Salisbury M.D.   On: 03/01/2018 21:28   Result Date: 02/16/2018 CLINICAL DATA:  Fall with left rib and shoulder pain. EXAM: LEFT SHOULDER - 1 VIEW COMPARISON:  None. FINDINGS: Comminuted mildly inferiorly angulated fracture of the distal clavicle. Mild widening of the [facility] joint measuring 5 mm. No evidence of humeral or glenoid fracture. The glenohumeral joint is normally located. IMPRESSION: Comminuted mildly angulated distal clavicular fracture with widening of the AC joint. Electronically Signed: By: Fidela Salisbury M.D. On: 02/16/2018 21:22    Review of Systems  Unable to perform ROS: Dementia  Gastrointestinal: Positive for heartburn.   Blood pressure (!) 191/85, pulse 78, temperature 98.2 F (36.8 C), temperature source Oral, resp. rate (!) 21, height '5\' 3"'$  (1.6 m), weight 47.6 kg (105 lb), SpO2 100 %. Physical Exam  Nursing note and vitals reviewed. Constitutional: She appears cachectic.  HENT:  Head: Normocephalic and atraumatic.  Right Ear: External ear normal.  Left Ear: External ear normal.  Mouth/Throat: Oropharynx is clear and moist.  Eyes: Pupils are equal, round, and reactive to light. EOM are normal. No scleral icterus.  Neck: Neck supple. No spinous process tenderness and no muscular tenderness present.  Cardiovascular: Normal rate, regular rhythm, normal heart sounds and intact distal pulses.  Respiratory: Effort normal. She exhibits tenderness (left anterior and left lateral chest).  GI: Soft. There is no tenderness.  Musculoskeletal:  Tender over left clavicle with ecchymosis   Lymphadenopathy:    She has no cervical adenopathy.  Neurological: She is alert. She is disoriented. GCS eye subscore is 4. GCS  verbal subscore is 5. GCS motor subscore is 6.  Skin: Skin is warm and dry.    Assessment/Plan: Fall (with history of falls)  Left ptx/rib fractures- pigtail placed, will follow with medicine, pain control for rib fx Clavicle fx- ortho consult pending (sling overnight, [place] to see in am) History of falls/medical issues- trh has admitted recently and appreciate them seeing her again, her major issues are medical and with disposition  Rolm Bookbinder 02/12/2018, 11:41 PM

## 2018-03-03 NOTE — ED Provider Notes (Signed)
[facility]ENC[facility] DEPARTMENT Provider Note   CSN: [id number] Arrival date & time: 02/17/2018  1854     History   Chief Complaint Chief Complaint  Patient presents with  . Fall  . Loss of Consciousness    HPI Brooke Conley is a 84 y.o. female.  The history is provided by the patient.  Loss of Consciousness   This is a new problem. The current episode started 6 to 12 hours ago. The problem occurs constantly. The problem has been resolved. Pertinent negatives include abdominal pain, back pain, chest pain, fever, headaches, nausea, palpitations and vomiting.  -Patient states she was in the kitchen.  Something she may have been preparing lunch.  She had no prodromal symptoms denies any chest pain or palpitations.  She woke up on the floor.  She was on the floor for 2 or 3 hours.  The son found her laying in the kitchen floor.  She complains of left shoulder pain primarily in left-sided rib pain.  She is unsure if she hit her head.  She denies any neck or back pain or any arm or leg pain.  Past Medical History:  Diagnosis Date  . History of breast cancer   . History of uterine cancer 1982  . Hyperlipidemia   . Hypertension   . Osteoporosis   . Prediabetes   . Vitamin D deficiency     Patient Active Problem List   Diagnosis Date Noted  . Pneumothorax 02/12/2018  . CKD (chronic kidney disease) stage 3, GFR 30-59 ml/min (HCC) 02/05/2018  . Atrial fibrillation with RVR ([place]) 09/20/2017  . Syncope 09/19/2017  . Encounter for general adult medical examination with abnormal findings 07/26/2016  . Hypothyroid 07/06/2015  . Medicare annual wellness visit, initial 07/06/2015  . Medication management 06/26/2014  . Prediabetes   . Hypertension   . Hyperlipidemia   . Osteoporosis   . History of breast cancer   . Vitamin D deficiency   . History of uterine cancer     Past Surgical History:  Procedure Laterality Date  . BREAST SURGERY Right 2007   lumpectomy   . CATARACT EXTRACTION Right 2010  . COSMETIC SURGERY Bilateral 1979   Breast  . TONSILLECTOMY    . TOTAL ABDOMINAL HYSTERECTOMY W/ BILATERAL SALPINGOOPHORECTOMY       OB History   None      Home Medications    Prior to Admission medications   Medication Sig Start Date End Date Taking? Authorizing Provider  acetaminophen (TYLENOL) 500 MG tablet Take 1,000 mg by mouth every 6 (six) hours as needed.   Yes [provider]  amiodarone (PACERONE) 200 MG tablet TAKE 1 TABLET BY MOUTH TWO  TIMES DAILY Patient taking differently: TAKE 200 mg TABLET BY MOUTH TWO  TIMES DAILY 02/24/18  Yes [name], MD  apixaban (ELIQUIS) 2.5 MG TABS tablet Take 1 tablet (2.5 mg total) by mouth 2 (two) times daily. 02/08/18 03/10/18 Yes [name], MD  levothyroxine (SYNTHROID, LEVOTHROID) 50 MCG tablet Take 1 tablet by mouth  daily before breakfast Patient taking differently: 0.25 mcg. Take 1 tablet by mouth  daily before breakfast 08/21/17  Yes [name], MD  metoprolol succinate (TOPROL-XL) 50 MG 24 hr tablet Take 1 tablet (50 mg total) by mouth daily. Take with or immediately following a meal. 02/06/18 03/08/18 Yes [name], MD  tamoxifen (NOLVADEX) 20 MG tablet TAKE 1 TABLET BY MOUTH  EVERY DAY FOR OSTEOPOROSIS Patient taking differently: TAKE 20  mg TABLET BY MOUTH  EVERY DAY FOR OSTEOPOROSIS 09/17/16  Yes [name], MD  Bilberry 1000 MG CAPS Take 1 capsule by mouth daily.    [provider]  Biotin 10 MG CAPS Take 10 mg by mouth daily.    [provider]  Calcium Citrate-Vitamin D (CALCIUM + D PO) Take 1 tablet by mouth 2 (two) times daily. Takes calcium 600 mg and vitamin D 800 units BID    [provider]  Omega-3 Fatty Acids (FISH OIL) 1000 MG CAPS Take 1 capsule by mouth daily.    [provider]  tamoxifen (NOLVADEX) 20 MG tablet TAKE 1 TABLET BY MOUTH  EVERY DAY FOR OSTEOPOROSIS Patient not taking: Reported on 02/11/2018 10/09/17    [name], MD    Family History Family History  Problem Relation Age of Onset  . Cancer Mother        lung  . Hypertension Father     Social History Social History   Tobacco Use  . Smoking status: Never Smoker  . Smokeless tobacco: Never Used  Substance Use Topics  . Alcohol use: No  . Drug use: No     Allergies   Penicillins   Review of Systems Review of Systems  Constitutional: Negative for chills and fever.  HENT: Negative for ear pain and sore throat.   Eyes: Negative for pain and visual disturbance.  Respiratory: Negative for cough and shortness of breath.   Cardiovascular: Positive for syncope. Negative for chest pain and palpitations.  Gastrointestinal: Negative for abdominal pain, nausea and vomiting.  Genitourinary: Negative for dysuria.  Musculoskeletal: Negative for back pain and neck pain.       L shoulder pain  Skin: Negative for color change and rash.  Neurological: Positive for syncope. Negative for headaches.  All other systems reviewed and are negative.    Physical Exam Updated Vital Signs BP (!) 191/85   Pulse 78   Temp 98.2 F (36.8 C) (Oral)   Resp (!) 21   Ht 5\' 3"  (1.6 m)   Wt 47.6 kg (105 lb)   SpO2 100%   BMI 18.60 kg/m   Physical Exam  Constitutional: She is oriented to person, place, and time. She appears well-developed and well-nourished. No distress.  HENT:  Head: Normocephalic and atraumatic.  Eyes: Pupils are equal, round, and reactive to light. Conjunctivae and EOM are normal.  Neck: Neck supple.  Cardiovascular: Normal rate and regular rhythm.  No murmur heard. Pulmonary/Chest: Effort normal and breath sounds normal. No respiratory distress. She has no wheezes. She has no rales.    Abdominal: Soft. She exhibits no distension. There is no tenderness. There is no guarding.  Musculoskeletal: She exhibits no edema.       Left shoulder: She exhibits tenderness, bony tenderness, swelling and pain.       Cervical  back: She exhibits no tenderness.       Thoracic back: She exhibits no tenderness.       Lumbar back: She exhibits no tenderness.  Neurological: She is alert and oriented to person, place, and time. She has normal strength. No sensory deficit. GCS eye subscore is 4. GCS verbal subscore is 5. GCS motor subscore is 6.  Skin: Skin is warm and dry.  Psychiatric: She has a normal mood and affect.  Nursing note and vitals reviewed.    ED Treatments / Results  Labs (all labs ordered are listed, but only abnormal results are displayed) Labs Reviewed  BASIC  METABOLIC PANEL - Abnormal; Notable for the following components:      Result Value   Chloride 99 (*)    Glucose, Bld 161 (*)    BUN 26 (*)    Creatinine, Ser 1.70 (*)    GFR calc non Af Amer 26 (*)    GFR calc Af Amer 30 (*)    All other components within normal limits  CBC - Abnormal; Notable for the following components:   WBC 10.7 (*)    Platelets 146 (*)    All other components within normal limits  CBG MONITORING, ED - Abnormal; Notable for the following components:   Glucose-Capillary 178 (*)    All other components within normal limits  MAGNESIUM  URINALYSIS, ROUTINE W REFLEX MICROSCOPIC  I-STAT TROPONIN, ED    EKG EKG Interpretation  Date/Time:  Saturday March 03 2018 19:00:54 EDT Ventricular Rate:  84 PR Interval:    QRS Duration: 121 QT Interval:  410 QTC Calculation: 485 R Axis:   -34 Text Interpretation:  Sinus rhythm Prolonged PR interval Left bundle branch block similar to previous, artifact noted Confirmed by Elnora Morrison [phone]) on 02/23/2018 7:13:24 PM   Radiology Dg Clavicle Left  Addendum Date: 02/27/2018   ADDENDUM REPORT: 02/10/2018 23:15 ADDENDUM: These results were called by telephone at the time of interpretation on 02/25/2018 at 9:48 p.m. to Dr. Elnora Morrison , who verbally acknowledged these results. Electronically Signed   By: Fidela Salisbury M.D.   On: 02/23/2018 23:15   Result Date:  02/20/2018 CLINICAL DATA:  Status post fall with left shoulder pain. EXAM: LEFT CLAVICLE - 2+ VIEWS COMPARISON:  None. FINDINGS: Comminuted mildly inferiorly angulated fracture of the distal clavicle. Mild widening of the [facility] joint measuring 5 mm. No evidence of humeral or glenoid fracture. The glenohumeral joint is normally located. Displaced left fourth and fifth lateral rib fractures. Soft tissue pleural/subpleural thickening underneath the rib fractures may represent pulmonary contusion. Small pneumothorax cannot be excluded. Soft tissue emphysema along the left lateral chest wall. IMPRESSION: Comminuted mildly angulated distal clavicular fracture with widening of the AC joint. Displaced left fourth and fifth lateral rib fractures, with pleural subpleural contusion. Suspected small left pneumothorax. Dedicated chest radiograph, right-side-down may be considered for confirmation. Soft tissue emphysema along the left lateral chest wall. Electronically Signed: By: Fidela Salisbury M.D. On: 02/19/2018 21:27   Ct Head Wo Contrast  Result Date: 02/11/2018 CLINICAL DATA:  Head trauma while on Eliquis. EXAM: CT HEAD WITHOUT CONTRAST TECHNIQUE: Contiguous axial images were obtained from the base of the skull through the vertex without intravenous contrast. COMPARISON:  Brain MRI 09/19/2017 FINDINGS: Brain: No mass lesion, intraparenchymal hemorrhage or extra-axial collection. No evidence of acute cortical infarct. Minimal white matter hypoattenuation. 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 Vascular: No hyperdense vessel or unexpected vascular calcification. Skull: Normal visualized skull base, calvarium and extracranial soft tissues. Sinuses/Orbits: No sinus fluid levels or advanced mucosal thickening. No mastoid effusion. Normal orbits. IMPRESSION: Mild chronic small vessel disease without acute abnormality. Old right cerebellar infarct. Electronically Signed   By: Ulyses Jarred M.D.   On: 02/12/2018 20:33    Ct Chest Wo Contrast  Result Date: 02/23/2018 CLINICAL DATA:  Status post fall.  Chest pain. EXAM: CT CHEST WITHOUT CONTRAST TECHNIQUE: Multidetector CT imaging of the chest was performed following the standard protocol without IV contrast. COMPARISON:  Chest radiograph from the same date. FINDINGS: Cardiovascular: The mediastinal structures are displaced to the right. Mildly enlarged heart. Calcific  atherosclerotic disease of the coronary arteries and aorta. Mediastinum/Nodes: No mediastinal lymph nodes. Lungs/Pleura: There is a large left pneumothorax, occupying approximately 40% of the volume of the left hemithorax, with evidence of tension effect. Pulmonary contusion is seen adjacent to the displaced fourth and fifth left lateral rib fractures. There is a soft tissue thickening in the left apex and left lung base, measuring respectively 0.9 and 1.8 cm. More confluent subpleural soft tissue thickening with pleural calcifications is seen in the right lung apex measuring 1.7 x 5.1 cm. Upper Abdomen: No acute abnormality. Musculoskeletal: Displaced impacted left fourth and fifth lateral rib fractures, likely the cause of the left tension pneumothorax. Nondisplaced left third lateral rib fracture. Small soft tissue emphysema along the lateral left chest wall. Bilateral calcified breast implants. Comminuted left distal clavicular fracture with inferior angulation of the distal fracture fragment. IMPRESSION: Large left tension pneumothorax occupying approximately 40% of the volume of the left hemithorax. Pulmonary contusion underneath displaced fourth and fifth left lateral rib fractures. Additional nondisplaced left lateral third rib fracture. Small amount of left lateral chest wall emphysema. Confluent subpleural soft tissue thickening in the right lung apex. Patient has a history of treated right breast cancer, and these findings may represent post radiation changes. Pulmonary malignancy cannot be entirely  excluded. Non-contrast chest CT at 3-6 months is recommended. If the nodules are stable at time of repeat CT, then future CT at 18-24 months (from today's scan) is considered optional for low-risk patients, but is recommended for high-risk patients. This recommendation follows the consensus statement: Guidelines for Management of Incidental Pulmonary Nodules Detected on CT Images: From the Fleischner Society 2017; Radiology 2017; 284:228-243. Two areas of subpleural soft tissue thickening in the left lung apex and left lung base. Attention on future follow-up. Mildly enlarged heart. Calcific atherosclerotic disease of the coronary arteries and aorta. Comminuted slightly angulated distal left clavicular fracture. Critical Value/emergent results were called by telephone at the time of interpretation on 02/09/2018 at 10:50 pm to Dr. Elnora Morrison , who verbally acknowledged these results. Aortic Atherosclerosis (ICD10-I70.0). Electronically Signed   By: Fidela Salisbury M.D.   On: 03/01/2018 23:13   Dg Chest Portable 1 View  Result Date: 03/02/2018 CLINICAL DATA:  84 year old female with fall and left rib pain. EXAM: PORTABLE CHEST 1 VIEW COMPARISON:  Chest radiograph dated 09/18/2017 FINDINGS: The lungs are clear. There is no pleural effusion or pneumothorax. Biapical pleural thickening/scarring. The cardiac silhouette is within normal limits. Bilateral breast implants. There is osteopenia. There is apparent discontinuity of the left third and fourth ribs. A cardiac monitor lead overlies this area. Dedicated left rib series may provide better evaluation. There is a small subcutaneous air in the left chest wall. IMPRESSION: Fractures of the left third and fourth ribs. Electronically Signed   By: Anner Crete M.D.   On: 02/20/2018 21:24   Dg Shoulder Left Portable  Addendum Date: 03/01/2018   ADDENDUM REPORT: 03/04/2018 21:28 ADDENDUM: Displaced left fourth and fifth lateral rib fractures, with pleural  subpleural contusion. Suspected small left pneumothorax. Dedicated chest radiograph, right-side-down may be considered for confirmation. Soft tissue emphysema along the left lateral chest wall. Electronically Signed   By: Fidela Salisbury M.D.   On: 02/11/2018 21:28   Result Date: 02/26/2018 CLINICAL DATA:  Fall with left rib and shoulder pain. EXAM: LEFT SHOULDER - 1 VIEW COMPARISON:  None. FINDINGS: Comminuted mildly inferiorly angulated fracture of the distal clavicle. Mild widening of the [facility] joint measuring 5 mm. No  evidence of humeral or glenoid fracture. The glenohumeral joint is normally located. IMPRESSION: Comminuted mildly angulated distal clavicular fracture with widening of the AC joint. Electronically Signed: By: Fidela Salisbury M.D. On: 02/14/2018 21:22    Procedures Procedures (including critical care time)  Medications Ordered in ED Medications  lidocaine (PF) (XYLOCAINE) 1 % injection 10 mL (has no administration in time range)  fentaNYL (SUBLIMAZE) injection 50 mcg (has no administration in time range)  HYDROcodone-acetaminophen (NORCO/VICODIN) 5-325 MG per tablet 1 tablet (1 tablet Oral Given 02/09/2018 2035)  fentaNYL (SUBLIMAZE) 100 MCG/2ML injection (50 mcg  Given 02/11/2018 2339)     Initial Impression / Assessment and Plan / ED Course  I have reviewed the triage vital signs and the nursing notes.  Pertinent labs & imaging results that were available during my care of the patient were reviewed by me and considered in my medical decision making (see chart for details).     Patient is a 84 year old female with history of CKD stage III, atrial fibrillation on Eliquis, syncope, prediabetes, hypertension, hyperlipidemia who presents after syncopal episode at home where she fell to the left side.  She has left shoulder pain and left rib pain.  She is unsure if she had her head.    On exam she is in no acute acute distress but is noted to be hypertensive in the low 762G  systolic.  She is on 2 L of nasal cannula satting 100%.  She is afebrile.  She denies having any prodromal symptoms related to the syncope.  EKG here shows normal sinus rhythm without any signs of ischemia.  First troponin is negative.  Chest x-ray obtained which shows multiple rib fractures and possible pneumothorax.  CT chest was ordered which shows a large pneumothorax on the left with possible tension physiology.  She was never hypotensive or any distress did not have any tracheal deviation on physical exam this is not clinically present.  Left shoulder x-ray and clavicle x-ray shows a comminuted distal left clavicle fracture as well with AC joint widening.  Trauma surgery consulted who placed a chest tube here in the ED and will manage her chest tube and traumatic injuries.  Orthopedics is consulted for the clavicle fracture and they will see her in the morning.  They are recommending a sling for now.  Medicine is consulted for admission given her medical comorbidities and syncope workup.  Final Clinical Impressions(s) / ED Diagnoses   Final diagnoses:  Syncope, unspecified syncope type  Fracture of unspecified part of left clavicle, initial encounter for closed fracture  Closed fracture of multiple ribs of left side, initial encounter  Pneumothorax on left    ED Discharge Orders    None       Tobie Poet, DO 02/18/2018 2353    Elnora Morrison, MD 03/04/18 3151    Elnora Morrison, MD 03/20/18 1723

## 2018-03-03 NOTE — ED Notes (Signed)
Label sent to main lab for mag blood test.

## 2018-03-03 NOTE — ED Notes (Signed)
Patient transported to CT 

## 2018-03-03 NOTE — ED Notes (Signed)
Purewick applied to Pt at 22:20. Pt had difficulty understanding how it worked, but gave consent for it to be applied and used to collect urine from her.

## 2018-03-03 NOTE — H&P (Addendum)
TRH H&P   Patient Demographics:    Brooke Conley, is a 84 y.o. female  MRN: [id number]   DOB - 01/05/1927  Admit Date - 02/20/2018  Outpatient Primary MD for the patient is [name], MD  Referring MD/NP/PA:  DR. Nyoka Lint  Outpatient Specialists:   Patient coming from: home  Chief Complaint  Patient presents with  . Fall  . Loss of Consciousness      HPI:    Brooke Conley  is a 84 y.o. female, w h/o breast/ uterine cancer, hypertension, hyperlipidemia who presents with c/o fall and ? Syncope. Pt denies presyncopal symptoms such as cp, palp, sob, n/v, diarrhea, brbpr, focal weakness, numbness, tingling. pt was brought to ED for  Evaluation of fall.   In ED,  CT brain IMPRESSION: Mild chronic small vessel disease without acute abnormality. Old right cerebellar infarct.  CXR IMPRESSION: Fractures of the left third and fourth ribs. ADDENDUM: Displaced left fourth and fifth lateral rib fractures, with pleural subpleural contusion. Suspected small left pneumothorax. Dedicated chest radiograph, right-side-down may be considered for confirmation.  CT chest IMPRESSION: Large left tension pneumothorax occupying approximately 40% of the volume of the left hemithorax.  Pulmonary contusion underneath displaced fourth and fifth left lateral rib fractures. Additional nondisplaced left lateral third rib fracture. Small amount of left lateral chest wall emphysema  Trauma consulted by ED to evaluate for pneumothorax, and left clavicular fracture.   Pt will be admitted for fall, syncope, and pneumothorax and rib fractures and clavicular fracture (left)   Review of systems:    In addition to the HPI above,  No Fever-chills, No Headache, No changes with Vision or hearing, No problems swallowing food or Liquids, No Chest pain, Cough or Shortness of Breath, No Abdominal  pain, No Nausea or Vommitting, Bowel movements are regular, No Blood in stool or Urine, No dysuria, No new skin rashes or bruises, No new joints pains-aches,  No new weakness, tingling, numbness in any extremity, No recent weight gain or loss, No polyuria, polydypsia or polyphagia, No significant Mental Stressors.  A full 10 point Review of Systems was done, except as stated above, all other Review of Systems were negative.   With Past History of the following :    Past Medical History:  Diagnosis Date  . History of breast cancer   . History of uterine cancer 1982  . Hyperlipidemia   . Hypertension   . Osteoporosis   . Prediabetes   . Vitamin D deficiency       Past Surgical History:  Procedure Laterality Date  . BREAST SURGERY Right 2007   lumpectomy  . CATARACT EXTRACTION Right 2010  . COSMETIC SURGERY Bilateral 1979   Breast  . TONSILLECTOMY    . TOTAL ABDOMINAL HYSTERECTOMY W/ BILATERAL SALPINGOOPHORECTOMY        Social History:     Social History  Tobacco Use  . Smoking status: Never Smoker  . Smokeless tobacco: Never Used  Substance Use Topics  . Alcohol use: No     Lives -  At home Mobility -  Walks by self    Family History :     Family History  Problem Relation Age of Onset  . Cancer Mother        lung  . Hypertension Father       Home Medications:   Prior to Admission medications   Medication Sig Start Date End Date Taking? Authorizing Provider  acetaminophen (TYLENOL) 500 MG tablet Take 1,000 mg by mouth every 6 (six) hours as needed.   Yes [provider]  amiodarone (PACERONE) 200 MG tablet TAKE 1 TABLET BY MOUTH TWO  TIMES DAILY Patient taking differently: TAKE 200 mg TABLET BY MOUTH TWO  TIMES DAILY 02/24/18  Yes [name], MD  apixaban (ELIQUIS) 2.5 MG TABS tablet Take 1 tablet (2.5 mg total) by mouth 2 (two) times daily. 02/08/18 03/10/18 Yes [name], MD  levothyroxine (SYNTHROID, LEVOTHROID) 50 MCG  tablet Take 1 tablet by mouth  daily before breakfast Patient taking differently: 0.25 mcg. Take 1 tablet by mouth  daily before breakfast 08/21/17  Yes [name], MD  metoprolol succinate (TOPROL-XL) 50 MG 24 hr tablet Take 1 tablet (50 mg total) by mouth daily. Take with or immediately following a meal. 02/06/18 03/08/18 Yes [name], MD  tamoxifen (NOLVADEX) 20 MG tablet TAKE 1 TABLET BY MOUTH  EVERY DAY FOR OSTEOPOROSIS Patient taking differently: TAKE 20 mg TABLET BY MOUTH  EVERY DAY FOR OSTEOPOROSIS 09/17/16  Yes [name], MD  Bilberry 1000 MG CAPS Take 1 capsule by mouth daily.    [provider]  Biotin 10 MG CAPS Take 10 mg by mouth daily.    [provider]  Calcium Citrate-Vitamin D (CALCIUM + D PO) Take 1 tablet by mouth 2 (two) times daily. Takes calcium 600 mg and vitamin D 800 units BID    [provider]  Omega-3 Fatty Acids (FISH OIL) 1000 MG CAPS Take 1 capsule by mouth daily.    [provider]  tamoxifen (NOLVADEX) 20 MG tablet TAKE 1 TABLET BY MOUTH  EVERY DAY FOR OSTEOPOROSIS Patient not taking: Reported on 02/22/2018 10/09/17   [name], MD     Allergies:     Allergies  Allergen Reactions  . Penicillins Rash     Physical Exam:   Vitals  Blood pressure (!) 191/85, pulse 78, temperature 98.2 F (36.8 C), temperature source Oral, resp. rate (!) 21, height 5\' 3"  (1.6 m), weight 47.6 kg (105 lb), SpO2 100 %.   1. General  lying in bed in NAD,   2. Normal affect and insight, Not Suicidal or Homicidal, Awake Alert, Oriented X 3.  3. No F.N deficits, ALL C.Nerves Intact, Strength 5/5 all 4 extremities, Sensation intact all 4 extremities, Plantars down going.  4. Ears and Eyes appear Normal, Conjunctivae clear, PERRLA. Moist Oral Mucosa.  5. Supple Neck, No JVD, No cervical lymphadenopathy appriciated, No Carotid Bruits.  6. Symmetrical Chest wall movement, Good air movement bilaterally, decrease bs left  side  7. RRR, No Gallops, Rubs or Murmurs, No Parasternal Heave.  8. Positive Bowel Sounds, Abdomen Soft, No tenderness, No organomegaly appriciated,No rebound -guarding or rigidity.  9.  No Cyanosis, Normal Skin Turgor, No Skin Rash or Bruise.  10. Good muscle tone,  joints appear normal , no effusions, Normal ROM.  11. No Palpable Lymph Nodes in Neck or Axillae  Chest tube on left side of chest   Data Review:    CBC Recent Labs  Lab 02/16/2018 2041  WBC 10.7*  HGB 12.2  HCT 37.1  PLT 146*  MCV 94.9  MCH 31.2  MCHC 32.9  RDW 14.9   ------------------------------------------------------------------------------------------------------------------  Chemistries  Recent Labs  Lab 02/02/2018 2041 02/12/2018 2145  NA 136  --   K 4.1  --   CL 99*  --   CO2 24  --   GLUCOSE 161*  --   BUN 26*  --   CREATININE 1.70*  --   CALCIUM 9.2  --   MG  --  2.0   ------------------------------------------------------------------------------------------------------------------ estimated creatinine clearance is 17.2 mL/min (A) (by C-G formula based on SCr of 1.7 mg/dL (H)). ------------------------------------------------------------------------------------------------------------------ No results for input(s): TSH, T4TOTAL, T3FREE, THYROIDAB in the last 72 hours.  Invalid input(s): FREET3  Coagulation profile No results for input(s): INR, PROTIME in the last 168 hours. ------------------------------------------------------------------------------------------------------------------- No results for input(s): DDIMER in the last 72 hours. -------------------------------------------------------------------------------------------------------------------  Cardiac Enzymes No results for input(s): CKMB, TROPONINI, MYOGLOBIN in the last 168 hours.  Invalid input(s): CK ------------------------------------------------------------------------------------------------------------------ No  results found for: BNP   ---------------------------------------------------------------------------------------------------------------  Urinalysis    Component Value Date/Time   COLORURINE YELLOW 09/18/2017 2115   APPEARANCEUR CLEAR 09/18/2017 2115   LABSPEC 1.010 09/18/2017 2115   LABSPEC 1.010 05/09/2006 1305   PHURINE 7.0 09/18/2017 2115   GLUCOSEU NEGATIVE 09/18/2017 2115   HGBUR NEGATIVE 09/18/2017 2115   BILIRUBINUR NEGATIVE 09/18/2017 2115   BILIRUBINUR Negative 05/09/2006 [place] 09/18/2017 2115   PROTEINUR 100 (A) 09/18/2017 2115   UROBILINOGEN 0.2 09/11/2014 1527   NITRITE NEGATIVE 09/18/2017 2115   LEUKOCYTESUR NEGATIVE 09/18/2017 2115   LEUKOCYTESUR Negative 05/09/2006 1305    ----------------------------------------------------------------------------------------------------------------   Imaging Results:    Dg Clavicle Left  Addendum Date: 02/11/2018   ADDENDUM REPORT: 02/11/2018 23:15 ADDENDUM: These results were called by telephone at the time of interpretation on 02/25/2018 at 9:48 p.m. to Dr. Elnora Morrison , who verbally acknowledged these results. Electronically Signed   By: Fidela Salisbury M.D.   On: 02/02/2018 23:15   Result Date: 02/02/2018 CLINICAL DATA:  Status post fall with left shoulder pain. EXAM: LEFT CLAVICLE - 2+ VIEWS COMPARISON:  None. FINDINGS: Comminuted mildly inferiorly angulated fracture of the distal clavicle. Mild widening of the [facility] joint measuring 5 mm. No evidence of humeral or glenoid fracture. The glenohumeral joint is normally located. Displaced left fourth and fifth lateral rib fractures. Soft tissue pleural/subpleural thickening underneath the rib fractures may represent pulmonary contusion. Small pneumothorax cannot be excluded. Soft tissue emphysema along the left lateral chest wall. IMPRESSION: Comminuted mildly angulated distal clavicular fracture with widening of the AC joint. Displaced left fourth and fifth  lateral rib fractures, with pleural subpleural contusion. Suspected small left pneumothorax. Dedicated chest radiograph, right-side-down may be considered for confirmation. Soft tissue emphysema along the left lateral chest wall. Electronically Signed: By: Fidela Salisbury M.D. On: 02/20/2018 21:27   Ct Head Wo Contrast  Result Date: 02/10/2018 CLINICAL DATA:  Head trauma while on Eliquis. EXAM: CT HEAD WITHOUT CONTRAST TECHNIQUE: Contiguous axial images were obtained from the base of the skull through the vertex without intravenous contrast. COMPARISON:  Brain MRI 09/19/2017 FINDINGS: Brain: No mass lesion, intraparenchymal hemorrhage or extra-axial collection. No evidence of acute cortical infarct. Minimal white matter hypoattenuation. There is an old right cerebellar  infarct. Vascular: No hyperdense vessel or unexpected vascular calcification. Skull: Normal visualized skull base, calvarium and extracranial soft tissues. Sinuses/Orbits: No sinus fluid levels or advanced mucosal thickening. No mastoid effusion. Normal orbits. IMPRESSION: Mild chronic small vessel disease without acute abnormality. Old right cerebellar infarct. Electronically Signed   By: Ulyses Jarred M.D.   On: 02/25/2018 20:33   Ct Chest Wo Contrast  Result Date: 02/19/2018 CLINICAL DATA:  Status post fall.  Chest pain. EXAM: CT CHEST WITHOUT CONTRAST TECHNIQUE: Multidetector CT imaging of the chest was performed following the standard protocol without IV contrast. COMPARISON:  Chest radiograph from the same date. FINDINGS: Cardiovascular: The mediastinal structures are displaced to the right. Mildly enlarged heart. Calcific atherosclerotic disease of the coronary arteries and aorta. Mediastinum/Nodes: No mediastinal lymph nodes. Lungs/Pleura: There is a large left pneumothorax, occupying approximately 40% of the volume of the left hemithorax, with evidence of tension effect. Pulmonary contusion is seen adjacent to the displaced fourth  and fifth left lateral rib fractures. There is a soft tissue thickening in the left apex and left lung base, measuring respectively 0.9 and 1.8 cm. More confluent subpleural soft tissue thickening with pleural calcifications is seen in the right lung apex measuring 1.7 x 5.1 cm. Upper Abdomen: No acute abnormality. Musculoskeletal: Displaced impacted left fourth and fifth lateral rib fractures, likely the cause of the left tension pneumothorax. Nondisplaced left third lateral rib fracture. Small soft tissue emphysema along the lateral left chest wall. Bilateral calcified breast implants. Comminuted left distal clavicular fracture with inferior angulation of the distal fracture fragment. IMPRESSION: Large left tension pneumothorax occupying approximately 40% of the volume of the left hemithorax. Pulmonary contusion underneath displaced fourth and fifth left lateral rib fractures. Additional nondisplaced left lateral third rib fracture. Small amount of left lateral chest wall emphysema. Confluent subpleural soft tissue thickening in the right lung apex. Patient has a history of treated right breast cancer, and these findings may represent post radiation changes. Pulmonary malignancy cannot be entirely excluded. Non-contrast chest CT at 3-6 months is recommended. If the nodules are stable at time of repeat CT, then future CT at 18-24 months (from today's scan) is considered optional for low-risk patients, but is recommended for high-risk patients. This recommendation follows the consensus statement: Guidelines for Management of Incidental Pulmonary Nodules Detected on CT Images: From the Fleischner Society 2017; Radiology 2017; 284:228-243. Two areas of subpleural soft tissue thickening in the left lung apex and left lung base. Attention on future follow-up. Mildly enlarged heart. Calcific atherosclerotic disease of the coronary arteries and aorta. Comminuted slightly angulated distal left clavicular fracture. Critical  Value/emergent results were called by telephone at the time of interpretation on 03/04/2018 at 10:50 pm to Dr. Elnora Morrison , who verbally acknowledged these results. Aortic Atherosclerosis (ICD10-I70.0). Electronically Signed   By: Fidela Salisbury M.D.   On: 02/19/2018 23:13   Dg Chest Portable 1 View  Result Date: 02/23/2018 CLINICAL DATA:  84 year old female with fall and left rib pain. EXAM: PORTABLE CHEST 1 VIEW COMPARISON:  Chest radiograph dated 09/18/2017 FINDINGS: The lungs are clear. There is no pleural effusion or pneumothorax. Biapical pleural thickening/scarring. The cardiac silhouette is within normal limits. Bilateral breast implants. There is osteopenia. There is apparent discontinuity of the left third and fourth ribs. A cardiac monitor lead overlies this area. Dedicated left rib series may provide better evaluation. There is a small subcutaneous air in the left chest wall. IMPRESSION: Fractures of the left third and fourth ribs. Electronically  Signed   By: Anner Crete M.D.   On: 03/02/2018 21:24   Dg Shoulder Left Portable  Addendum Date: 03/02/2018   ADDENDUM REPORT: 02/20/2018 21:28 ADDENDUM: Displaced left fourth and fifth lateral rib fractures, with pleural subpleural contusion. Suspected small left pneumothorax. Dedicated chest radiograph, right-side-down may be considered for confirmation. Soft tissue emphysema along the left lateral chest wall. Electronically Signed   By: Fidela Salisbury M.D.   On: 03/02/2018 21:28   Result Date: 02/02/2018 CLINICAL DATA:  Fall with left rib and shoulder pain. EXAM: LEFT SHOULDER - 1 VIEW COMPARISON:  None. FINDINGS: Comminuted mildly inferiorly angulated fracture of the distal clavicle. Mild widening of the [facility] joint measuring 5 mm. No evidence of humeral or glenoid fracture. The glenohumeral joint is normally located. IMPRESSION: Comminuted mildly angulated distal clavicular fracture with widening of the AC joint. Electronically  Signed: By: Fidela Salisbury M.D. On: 02/09/2018 21:22       Assessment & Plan:    Principal Problem:   Pneumothorax Active Problems:   Hypertension   Hypothyroid   Syncope   CKD (chronic kidney disease) stage 3, GFR 30-59 ml/min (HCC)    Fall, Syncope Tele Trop I q6h x3 Carotid ultrasound Cardiac echo Cpk, mb  Pneumothorax/ left distal clavicular fracture, rib fracture Will need chest tube Trauma surgery consulted by ED, appreciate  Input Outpatient bone density testing please  Hypertension uncontrolled Hydralazine 10mg  iv q6h prn Cont metoprolol xl 50mg  po qday  Pafib Cont Amiodarone Cont Metoprolol XL 50mg  po qday Cont Eliquis 2.5mg  po bid, may need discussion on whether to continue due to fall risk, will continue for now  Hypothyroidism Cont Levothyroxine  H/o breast cancer Cont Tamoxifen  Lung nodules Recommendation on CT to repeat CT chest noncontrast in 3-6 months    DVT Prophylaxis eliquis- SCDs   AM Labs Ordered, also please review Full Orders  Family Communication: Admission, patients condition and plan of care including tests being ordered have been discussed with the patient  who indicate understanding and agree with the plan and Code Status.  Code Status FULL CODE  Likely DC to  home  Condition GUARDED   Consults called: trauma surgery by ED  Admission status: inpatient  Time spent in minutes : 45   Jani Gravel M.D on 03/01/2018 at 11:42 PM  Between 7am to 7pm - Pager - [phone]  . After 7pm go to [email or web address] - password [facility]  [facility] - Office  [phone]

## 2018-03-03 NOTE — ED Triage Notes (Addendum)
Per EMS- pt had a syncopal fall, c.o. Left shoulder pain. Hypertensive at triage. Alert and oriented X 4. PIV placed by EMS, no meds for pain given. Pt is on Eliquis. Pt also has pain to posterior head. Pt in afib, hx of same. CBG 165. Denies chest pain. Pt does endorse generalized weakness.

## 2018-03-03 NOTE — Procedures (Signed)
Chest Tube Insertion Procedure Note  Indications:  Clinically significant Pneumothorax  Pre-operative Diagnosis: Pneumothorax  Post-operative Diagnosis: Pneumothorax  Procedure Details  Informed consent was obtained for the procedure, including sedation.  Risks of lung perforation, hemorrhage, arrhythmia, and adverse drug reaction were discussed.   After sterile skin prep, using standard technique, a pigtail catheter was placed after administering local anesthetic.  The chest was accessed with a needle and confirmed with air.  I then placed the wire. This was dilated and the catheter placed. This was secured with silk suture.  cxr pending.   Findings: air  Estimated Blood Loss:  Minimal         Specimens:  None              Complications:  None; patient tolerated the procedure well.         Disposition: floor         Condition: stable  Attending Attestation: I performed the procedure.

## 2018-03-03 NOTE — ED Notes (Signed)
Family at bedside. 

## 2018-03-03 NOTE — ED Notes (Signed)
Pt assisted to use bed pan.

## 2018-03-03 NOTE — ED Notes (Signed)
CBG was 178 at 20:17.

## 2018-03-04 ENCOUNTER — Other Ambulatory Visit: Payer: Self-pay

## 2018-03-04 ENCOUNTER — Inpatient Hospital Stay (HOSPITAL_COMMUNITY): Payer: Medicare Other

## 2018-03-04 DIAGNOSIS — R55 Syncope and collapse: Secondary | ICD-10-CM

## 2018-03-04 LAB — CBC
HEMATOCRIT: 37.8 % (ref 36.0–46.0)
Hemoglobin: 12.5 g/dL (ref 12.0–15.0)
MCH: 31.3 pg (ref 26.0–34.0)
MCHC: 33.1 g/dL (ref 30.0–36.0)
MCV: 94.5 fL (ref 78.0–100.0)
PLATELETS: 152 10*3/uL (ref 150–400)
RBC: 4 MIL/uL (ref 3.87–5.11)
RDW: 14.9 % (ref 11.5–15.5)
WBC: 7.8 10*3/uL (ref 4.0–10.5)

## 2018-03-04 LAB — COMPREHENSIVE METABOLIC PANEL
ALT: 23 U/L (ref 14–54)
AST: 31 U/L (ref 15–41)
Albumin: 3.4 g/dL — ABNORMAL LOW (ref 3.5–5.0)
Alkaline Phosphatase: 63 U/L (ref 38–126)
Anion gap: 13 (ref 5–15)
BILIRUBIN TOTAL: 1 mg/dL (ref 0.3–1.2)
BUN: 23 mg/dL — AB (ref 6–20)
CO2: 23 mmol/L (ref 22–32)
Calcium: 8.8 mg/dL — ABNORMAL LOW (ref 8.9–10.3)
Chloride: 99 mmol/L — ABNORMAL LOW (ref 101–111)
Creatinine, Ser: 1.59 mg/dL — ABNORMAL HIGH (ref 0.44–1.00)
GFR calc Af Amer: 32 mL/min — ABNORMAL LOW (ref >60)
GFR, EST NON AFRICAN AMERICAN: 28 mL/min — AB (ref >60)
Glucose, Bld: 116 mg/dL — ABNORMAL HIGH (ref 65–99)
POTASSIUM: 3.5 mmol/L (ref 3.5–5.1)
Sodium: 135 mmol/L (ref 135–145)
TOTAL PROTEIN: 6.7 g/dL (ref 6.5–8.1)

## 2018-03-04 LAB — URINALYSIS, ROUTINE W REFLEX MICROSCOPIC
Bacteria, UA: NONE SEEN
Bilirubin Urine: NEGATIVE
GLUCOSE, UA: 50 mg/dL — AB
Ketones, ur: 20 mg/dL — AB
Leukocytes, UA: NEGATIVE
NITRITE: NEGATIVE
Protein, ur: 30 mg/dL — AB
SPECIFIC GRAVITY, URINE: 1.018 (ref 1.005–1.030)
Squamous Epithelial / LPF: NONE SEEN
pH: 6 (ref 5.0–8.0)

## 2018-03-04 LAB — CK TOTAL AND CKMB (NOT AT ARMC)
CK TOTAL: 149 U/L (ref 38–234)
CK, MB: 2.2 ng/mL (ref 0.5–5.0)
Relative Index: 1.5 (ref 0.0–2.5)

## 2018-03-04 LAB — MRSA PCR SCREENING: MRSA by PCR: NEGATIVE

## 2018-03-04 LAB — ECHOCARDIOGRAM LIMITED
HEIGHTINCHES: 63 in
WEIGHTICAEL: 1680 [oz_av]

## 2018-03-04 LAB — TROPONIN I
TROPONIN I: 0.03 ng/mL — AB (ref <0.03)
Troponin I: 0.03 ng/mL (ref <0.03)

## 2018-03-04 LAB — TSH: TSH: 41.947 u[IU]/mL — AB (ref 0.350–4.500)

## 2018-03-04 MED ORDER — MORPHINE SULFATE (PF) 4 MG/ML IV SOLN
0.5000 mg | INTRAVENOUS | Status: DC | PRN
  Administered 2018-03-04 (×2): 0.52 mg via INTRAVENOUS
  Filled 2018-03-04 (×2): qty 1

## 2018-03-04 MED ORDER — MORPHINE SULFATE (PF) 4 MG/ML IV SOLN
1.0000 mg | INTRAVENOUS | Status: DC | PRN
  Administered 2018-03-04 – 2018-03-05 (×7): 1 mg via INTRAVENOUS
  Filled 2018-03-04 (×8): qty 1

## 2018-03-04 MED ORDER — HYDROCODONE-ACETAMINOPHEN 5-325 MG PO TABS: 1.0000 | ORAL_TABLET | ORAL | Status: DC | PRN

## 2018-03-04 MED ORDER — OXYCODONE HCL 5 MG PO TABS: 5.0000 mg | ORAL_TABLET | ORAL | Status: DC | PRN

## 2018-03-04 MED ORDER — APIXABAN 2.5 MG PO TABS
2.5000 mg | ORAL_TABLET | Freq: Two times a day (BID) | ORAL | Status: DC
  Administered 2018-03-04 – 2018-03-06 (×6): 2.5 mg via ORAL
  Filled 2018-03-04 (×9): qty 1

## 2018-03-04 MED ORDER — AMIODARONE HCL 200 MG PO TABS
200.0000 mg | ORAL_TABLET | Freq: Two times a day (BID) | ORAL | Status: DC
  Administered 2018-03-04 – 2018-03-06 (×6): 200 mg via ORAL
  Filled 2018-03-04 (×7): qty 1

## 2018-03-04 MED ORDER — TAMOXIFEN CITRATE 10 MG PO TABS
20.0000 mg | ORAL_TABLET | Freq: Every day | ORAL | Status: DC
  Administered 2018-03-04 – 2018-03-12 (×7): 20 mg via ORAL
  Filled 2018-03-04 (×9): qty 2

## 2018-03-04 MED ORDER — LEVOTHYROXINE SODIUM 25 MCG PO TABS
25.0000 ug | ORAL_TABLET | Freq: Every day | ORAL | Status: DC
  Administered 2018-03-05 – 2018-03-12 (×6): 25 ug via ORAL
  Filled 2018-03-04 (×7): qty 1

## 2018-03-04 MED ORDER — METHOCARBAMOL 500 MG PO TABS: 500.0000 mg | ORAL_TABLET | Freq: Three times a day (TID) | ORAL | Status: DC | PRN

## 2018-03-04 MED ORDER — ACETAMINOPHEN 500 MG PO TABS
1000.0000 mg | ORAL_TABLET | Freq: Three times a day (TID) | ORAL | Status: DC
  Administered 2018-03-04 – 2018-03-05 (×3): 1000 mg via ORAL
  Filled 2018-03-04 (×4): qty 2

## 2018-03-04 MED ORDER — METOPROLOL SUCCINATE ER 50 MG PO TB24: 50.0000 mg | ORAL_TABLET | Freq: Every day | ORAL | Status: DC

## 2018-03-04 MED ORDER — METOPROLOL SUCCINATE ER 50 MG PO TB24
50.0000 mg | ORAL_TABLET | Freq: Every day | ORAL | Status: DC
  Administered 2018-03-04 – 2018-03-06 (×3): 50 mg via ORAL
  Filled 2018-03-04 (×4): qty 1

## 2018-03-04 MED ORDER — OXYCODONE HCL 5 MG PO TABS
5.0000 mg | ORAL_TABLET | ORAL | Status: DC | PRN
  Administered 2018-03-04 – 2018-03-05 (×3): 5 mg via ORAL
  Filled 2018-03-04 (×4): qty 1

## 2018-03-04 NOTE — ED Notes (Signed)
Notified pharmacy for missing medications

## 2018-03-04 NOTE — Consult Note (Signed)
Orthopaedic Trauma Service (OTS) Consult   Patient ID: ELGENE CORAL MRN: [id number] DOB/AGE: [birth year] 84 y.o.  Reason for Consult:Left distal clavicle fracture Referring Physician: Dr. Elnora Morrison, MD [facility]  HPI: Brooke Conley is an 84 y.o. female who is being seen in consultation at the request of Dr. Reather Converse for evaluation of left distal clavicle fracture.  The patient fell yesterday she has a history of multiple falls requiring admission.  She also on Eliquis for atrial fibrillation.  She was brought to the emergency room where she had multiple rib fractures a left clavicle fracture and a left pneumothorax.  A pigtail catheter was placed in her chest by Dr. Donne Hazel yesterday evening.  She complains of left-sided pain.  Denies any right-sided pain and bilateral lower extremity pain.  She is not the greatest historian and has difficulty with following commands and paying attention to questioning.  Past Medical History:  Diagnosis Date  . History of breast cancer   . History of uterine cancer 1982  . Hyperlipidemia   . Hypertension   . Osteoporosis   . Prediabetes   . Vitamin D deficiency     Past Surgical History:  Procedure Laterality Date  . BREAST SURGERY Right 2007   lumpectomy  . CATARACT EXTRACTION Right 2010  . COSMETIC SURGERY Bilateral 1979   Breast  . TONSILLECTOMY    . TOTAL ABDOMINAL HYSTERECTOMY W/ BILATERAL SALPINGOOPHORECTOMY      Family History  Problem Relation Age of Onset  . Cancer Mother        lung  . Hypertension Father     Social History:  reports that she has never smoked. She has never used smokeless tobacco. She reports that she does not drink alcohol or use drugs.  Allergies:  Allergies  Allergen Reactions  . Penicillins Rash    Medications:  No current facility-administered medications on file prior to encounter.    Current Outpatient Medications on File Prior to Encounter  Medication Sig Dispense Refill  .  acetaminophen (TYLENOL) 500 MG tablet Take 1,000 mg by mouth every 6 (six) hours as needed.    Marland Kitchen amiodarone (PACERONE) 200 MG tablet TAKE 1 TABLET BY MOUTH TWO  TIMES DAILY (Patient taking differently: TAKE 200 mg TABLET BY MOUTH TWO  TIMES DAILY) 90 tablet 1  . apixaban (ELIQUIS) 2.5 MG TABS tablet Take 1 tablet (2.5 mg total) by mouth 2 (two) times daily. 60 tablet 0  . levothyroxine (SYNTHROID, LEVOTHROID) 50 MCG tablet Take 1 tablet by mouth  daily before breakfast (Patient taking differently: 0.25 mcg. Take 1 tablet by mouth  daily before breakfast) 90 tablet 1  . metoprolol succinate (TOPROL-XL) 50 MG 24 hr tablet Take 1 tablet (50 mg total) by mouth daily. Take with or immediately following a meal. 90 tablet 0  . tamoxifen (NOLVADEX) 20 MG tablet TAKE 1 TABLET BY MOUTH  EVERY DAY FOR OSTEOPOROSIS (Patient taking differently: TAKE 20 mg TABLET BY MOUTH  EVERY DAY FOR OSTEOPOROSIS) 90 tablet 1  . Bilberry 1000 MG CAPS Take 1 capsule by mouth daily.    . Biotin 10 MG CAPS Take 10 mg by mouth daily.    . Calcium Citrate-Vitamin D (CALCIUM + D PO) Take 1 tablet by mouth 2 (two) times daily. Takes calcium 600 mg and vitamin D 800 units BID    . Omega-3 Fatty Acids (FISH OIL) 1000 MG CAPS Take 1 capsule by mouth daily.    . tamoxifen (NOLVADEX) 20 MG  tablet TAKE 1 TABLET BY MOUTH  EVERY DAY FOR OSTEOPOROSIS (Patient not taking: Reported on 02/15/2018) 90 tablet 1    ROS: Constitutional: No fever or chills Vision: No changes in vision ENT: No difficulty swallowing CV: No chest pain Pulm: No SOB or wheezing GI: No nausea or vomiting GU: No urgency or inability to hold urine Skin: No poor wound healing Neurologic: No numbness or tingling Psychiatric: No depression or anxiety Heme: No bruising Allergic: No reaction to medications or food   Exam: Blood pressure (!) 137/56, pulse 85, temperature 98.2 F (36.8 C), temperature source Oral, resp. rate (!) 22, height 5\' 3"  (1.6 m), weight 47.6 kg  (105 lb), SpO2 99 %. General: No acute distress Orientation: Awake and alert Mood and Affect: Cooperative and obviously in pain Gait: Not able be assessed due to the pain of her left side and being recumbent in the bed Coordination and balance: Within normal limits  Left upper extremity: Skin without lacerations or open wounds.  There is mild ecchymosis about her shoulder.  There is no obvious deformity on exam.  She is unable to move her shoulder without significant pain.  She does not want to move her rest of her arm as well.  Elbow and wrist are without deformity.  She is able to move her fingers and endorses sensation in median, radial and ulnar nerve distribution.  She is warm well-perfused hand.  Chest tube is in place on that left side.  Reflexes were not assessed.  No lymphadenopathy.  Bilateral lower extremities and right upper extremity: Skin without lesions. No tenderness to palpation. Full painless ROM, full strength in each muscle groups without evidence of instability.   Medical Decision Making: Imaging: X-rays of the left shoulder show a comminuted distal clavicle fracture.  These are recumbent but there is been no displacement of the acromion no other notable fractures or dislocations other than a few rib fractures noted on the x-ray  Labs:  CBC    Component Value Date/Time   WBC 7.8 03/04/2018 1041   RBC 4.00 03/04/2018 1041   HGB 12.5 03/04/2018 1041   HGB 12.0 10/20/2011 1444   HCT 37.8 03/04/2018 1041   HCT 35.5 10/20/2011 1444   PLT 152 03/04/2018 1041   PLT 161 10/20/2011 1444   MCV 94.5 03/04/2018 1041   MCV 91.4 10/20/2011 1444   MCH 31.3 03/04/2018 1041   MCHC 33.1 03/04/2018 1041   RDW 14.9 03/04/2018 1041   RDW 13.9 10/20/2011 1444   LYMPHSABS 1.2 09/19/2017 1118   LYMPHSABS 0.9 10/20/2011 1444   MONOABS 0.5 09/19/2017 1118   MONOABS 0.3 10/20/2011 1444   EOSABS 0.1 09/19/2017 1118   EOSABS 0.1 10/20/2011 1444   BASOSABS 0.0 09/19/2017 1118    BASOSABS 0.0 10/20/2011 1444    Medical history and chart was reviewed  Assessment/Plan: 84 year old female with a history of breast cancer hypertension on Eliquis for atrial fibrillation with a fall that has a left comminuted distal clavicle fracture  Other injuries include left pneumothorax and multiple rib fractures  After reviewing the images and due to her age I feel that this is a nonoperative fracture.  Will recommend placement of the sling.  She may perform passive and active range of motion as she tolerates once her pain improves.  Plan for nonweightbearing to the left upper extremity.  Will defer pain control and rib fracture and pneumothorax management to the trauma team.  Patient can follow-up in my clinic in 3-4 weeks  for repeat x-rays.  Weightbearing: NWB LUE Insicional and dressing care: None needed Orthopedic device(s): Sling for comfort Showering: Okay to shower from orthopaedic perspective VTE prophylaxis: Per primary team, okay to resume eliquis from orthopaedic perspective  Pain control: Per primary team Follow - up plan: 3-4 weeks  Shona Needles, MD [facility] [phone] (phone)

## 2018-03-04 NOTE — ED Notes (Addendum)
Paged trauma in regards to orders for chest tube maintenance.  2 [place] will not accept pt until orders are placed, per [facility] 2W.

## 2018-03-04 NOTE — ED Notes (Signed)
Attempted report x 2 

## 2018-03-04 NOTE — Progress Notes (Signed)
*  PRELIMINARY RESULTS* Echocardiogram 2D Echocardiogram LIMITED has been performed.  Brooke Conley 03/04/2018, 4:18 PM

## 2018-03-04 NOTE — Progress Notes (Signed)
    CC:  Fall with fx ribs 40%right pneumothorax/clavicle   Subjective: Pt not moving and can't breath deep or cough secondary to pain.    Objective: Vital signs in last 24 hours: Temp:  [98.2 F (36.8 C)] 98.2 F (36.8 C) (03/30 1905) Pulse Rate:  [68-91] 79 (03/31 1000) Resp:  [12-27] 23 (03/31 1000) BP: (139-213)/(63-95) 206/90 (03/31 1000) SpO2:  [100 %] 100 % (03/31 1000) Weight:  [47.6 kg (105 lb)] 47.6 kg (105 lb) (03/30 1905)  NO I/O in ED still Afebrile, BP up No labs Post CT film placement shows small residual apical ptx.    Intake/Output from previous day: No intake/output data recorded. Intake/Output this shift: No intake/output data recorded.  General appearance: alert, appears stated age and mild distress Resp: Minimal breath sounds, No air leak on suction  Lab Results:  Recent Labs    02/13/2018 2041  WBC 10.7*  HGB 12.2  HCT 37.1  PLT 146*    BMET Recent Labs    03/02/2018 2041  NA 136  K 4.1  CL 99*  CO2 24  GLUCOSE 161*  BUN 26*  CREATININE 1.70*  CALCIUM 9.2   PT/INR No results for input(s): LABPROT, INR in the last 72 hours.  No results for input(s): AST, ALT, ALKPHOS, BILITOT, PROT, ALBUMIN in the last 168 hours.   Lipase  No results found for: LIPASE   Medications: . amiodarone  200 mg Oral BID  . apixaban  2.5 mg Oral BID  . [START ON 03/05/2018] levothyroxine  25 mcg Oral QAC breakfast  . metoprolol succinate  50 mg Oral Daily  . tamoxifen  20 mg Oral Daily    Anti-infectives (From admission, onward)   None      Assessment/Plan Chronic kidney disease Hypertension Prediabetes Hyperlipidemia    Fall on Eliquis - admit to [facility] Left 40%ptx/rib fractures - pigtail placed, Dr. Donne Hazel Clavicle fx- ortho consult pending (sling overnight, [place] to see in am)  FEN:  Carb modified/heart heathy ID:  None DVT:  Apixaban Foley:  None Follow up:  TBD  Plan:  Pt having so much pain she isn't breathing or moving.   Adding tylenol, oxycodone and Morphine.  She cannot have Robaxin due to renal issue.  Add IS.     LOS: 1 day    Wallis Spizzirri 03/04/2018 [phone]

## 2018-03-04 NOTE — ED Notes (Signed)
Attempted report x1. 

## 2018-03-04 NOTE — Progress Notes (Signed)
Orthopedic Tech Progress Note Patient Details:  Brooke Conley 25-Jan-1927 [id number]  Ortho Devices Type of Ortho Device: Arm sling Ortho Device/Splint Location: LUE Ortho Device/Splint Interventions: [name] 03/04/2018, 10:41 PM

## 2018-03-04 NOTE — ED Notes (Signed)
Heart Healthy Meal Tray ordered.

## 2018-03-04 NOTE — Progress Notes (Signed)
PROGRESS NOTE    Brooke Conley  CHY:[id number] DOB: 1927-05-22 DOA: 02/07/2018 PCP: [name], MD    Brief Narrative:   84 y.o. female, w h/o breast/ uterine cancer, hypertension, hyperlipidemia who presents with c/o fall and ? Syncope. Pt denies presyncopal symptoms such as cp, palp, sob, n/v, diarrhea, brbpr, focal weakness, numbness, tingling. pt was brought to ED for  Evaluation of fall  Assessment & Plan:  Pneumothorax/ left distal clavicular fracture, rib fracture Chest tube placed by surgeon.  Trauma surgery on board.  Outpatient bone density testing please  Hypertension  Continue current regimen - Hydralazine 10mg  iv q6h prn - Cont metoprolol xl 50mg  po qday  Pafib Cont Amiodarone Cont Metoprolol XL 50mg  po qday Cont Eliquis 2.5mg  po bid, may need discussion on whether to continue due to fall risk, will continue for now  Hypothyroidism - Cont Levothyroxine  H/o breast cancer - Cont Tamoxifen  Lung nodules Recommendation on CT to repeat CT chest noncontrast in 3-6 months   DVT prophylaxis: Eliquis Code Status: Full Family Communication: none at bedside.  Disposition Plan: pending improvement in condition and recommendations by surgeons   Consultants:   Orthopaedic  General surgery   Procedures: none   Antimicrobials: none  Subjective: Pt has no new complaints.   Objective: Vitals:   03/04/18 1415 03/04/18 1430 03/04/18 1445 03/04/18 1500  BP: 140/67 (!) 135/57 138/64 130/60  Pulse: 74 73 78 74  Resp: 19 (!) 22 (!) 21 (!) 21  Temp:      TempSrc:      SpO2:   90%   Weight:      Height:       No intake or output data in the 24 hours ending 03/04/18 1523 Filed Weights   02/15/2018 1905  Weight: 47.6 kg (105 lb)    Examination:  General exam: Appears calm and comfortable, in nad.  Respiratory system: decreased breath sounds over left lung base.  Cardiovascular system: S1 & S2 heard, RRR.  Gastrointestinal system: Abdomen  is nondistended, soft and nontender. No organomegaly or masses felt. Normal bowel sounds heard. Central nervous system: Alert and oriented. No focal neurological deficits. Extremities: Symmetric 5 x 5 power. Skin: warm and dry Psychiatry: Mood & affect appropriate.   Data Reviewed: I have personally reviewed following labs and imaging studies  CBC: Recent Labs  Lab 02/28/2018 2041 03/04/18 1041  WBC 10.7* 7.8  HGB 12.2 12.5  HCT 37.1 37.8  MCV 94.9 94.5  PLT 146* 878   Basic Metabolic Panel: Recent Labs  Lab 03/04/2018 2041 02/17/2018 2145 03/04/18 1041  NA 136  --  135  K 4.1  --  3.5  CL 99*  --  99*  CO2 24  --  23  GLUCOSE 161*  --  116*  BUN 26*  --  23*  CREATININE 1.70*  --  1.59*  CALCIUM 9.2  --  8.8*  MG  --  2.0  --    GFR: Estimated Creatinine Clearance: 18.4 mL/min (A) (by C-G formula based on SCr of 1.59 mg/dL (H)). Liver Function Tests: Recent Labs  Lab 03/04/18 1041  AST 31  ALT 23  ALKPHOS 63  BILITOT 1.0  PROT 6.7  ALBUMIN 3.4*   No results for input(s): LIPASE, AMYLASE in the last 168 hours. No results for input(s): AMMONIA in the last 168 hours. Coagulation Profile: No results for input(s): INR, PROTIME in the last 168 hours. Cardiac Enzymes: Recent Labs  Lab 03/04/18 1041  CKTOTAL 149  CKMB 2.2  TROPONINI 0.03*   BNP (last 3 results) No results for input(s): PROBNP in the last 8760 hours. HbA1C: No results for input(s): HGBA1C in the last 72 hours. CBG: Recent Labs  Lab 02/21/2018 2016  GLUCAP 178*   Lipid Profile: No results for input(s): CHOL, HDL, LDLCALC, TRIG, CHOLHDL, LDLDIRECT in the last 72 hours. Thyroid Function Tests: Recent Labs    03/04/18 1041  TSH 41.947*   Anemia Panel: No results for input(s): VITAMINB12, FOLATE, FERRITIN, TIBC, IRON, RETICCTPCT in the last 72 hours. Sepsis Labs: No results for input(s): PROCALCITON, LATICACIDVEN in the last 168 hours.  No results found for this or any previous visit  (from the past 240 hour(s)).       Radiology Studies: Dg Clavicle Left  Addendum Date: 02/18/2018   ADDENDUM REPORT: 02/16/2018 23:15 ADDENDUM: These results were called by telephone at the time of interpretation on 02/11/2018 at 9:48 p.m. to Dr. Elnora Morrison , who verbally acknowledged these results. Electronically Signed   By: Fidela Salisbury M.D.   On: 02/13/2018 23:15   Result Date: 02/04/2018 CLINICAL DATA:  Status post fall with left shoulder pain. EXAM: LEFT CLAVICLE - 2+ VIEWS COMPARISON:  None. FINDINGS: Comminuted mildly inferiorly angulated fracture of the distal clavicle. Mild widening of the [facility] joint measuring 5 mm. No evidence of humeral or glenoid fracture. The glenohumeral joint is normally located. Displaced left fourth and fifth lateral rib fractures. Soft tissue pleural/subpleural thickening underneath the rib fractures may represent pulmonary contusion. Small pneumothorax cannot be excluded. Soft tissue emphysema along the left lateral chest wall. IMPRESSION: Comminuted mildly angulated distal clavicular fracture with widening of the AC joint. Displaced left fourth and fifth lateral rib fractures, with pleural subpleural contusion. Suspected small left pneumothorax. Dedicated chest radiograph, right-side-down may be considered for confirmation. Soft tissue emphysema along the left lateral chest wall. Electronically Signed: By: Fidela Salisbury M.D. On: 02/20/2018 21:27   Ct Head Wo Contrast  Result Date: 02/23/2018 CLINICAL DATA:  Head trauma while on Eliquis. EXAM: CT HEAD WITHOUT CONTRAST TECHNIQUE: Contiguous axial images were obtained from the base of the skull through the vertex without intravenous contrast. COMPARISON:  Brain MRI 09/19/2017 FINDINGS: Brain: No mass lesion, intraparenchymal hemorrhage or extra-axial collection. No evidence of acute cortical infarct. Minimal white matter hypoattenuation. 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 Vascular: No hyperdense  vessel or unexpected vascular calcification. Skull: Normal visualized skull base, calvarium and extracranial soft tissues. Sinuses/Orbits: No sinus fluid levels or advanced mucosal thickening. No mastoid effusion. Normal orbits. IMPRESSION: Mild chronic small vessel disease without acute abnormality. Old right cerebellar infarct. Electronically Signed   By: Ulyses Jarred M.D.   On: 02/24/2018 20:33   Ct Chest Wo Contrast  Result Date: 02/05/2018 CLINICAL DATA:  Status post fall.  Chest pain. EXAM: CT CHEST WITHOUT CONTRAST TECHNIQUE: Multidetector CT imaging of the chest was performed following the standard protocol without IV contrast. COMPARISON:  Chest radiograph from the same date. FINDINGS: Cardiovascular: The mediastinal structures are displaced to the right. Mildly enlarged heart. Calcific atherosclerotic disease of the coronary arteries and aorta. Mediastinum/Nodes: No mediastinal lymph nodes. Lungs/Pleura: There is a large left pneumothorax, occupying approximately 40% of the volume of the left hemithorax, with evidence of tension effect. Pulmonary contusion is seen adjacent to the displaced fourth and fifth left lateral rib fractures. There is a soft tissue thickening in the left apex and left lung base, measuring respectively 0.9 and 1.8 cm. More  confluent subpleural soft tissue thickening with pleural calcifications is seen in the right lung apex measuring 1.7 x 5.1 cm. Upper Abdomen: No acute abnormality. Musculoskeletal: Displaced impacted left fourth and fifth lateral rib fractures, likely the cause of the left tension pneumothorax. Nondisplaced left third lateral rib fracture. Small soft tissue emphysema along the lateral left chest wall. Bilateral calcified breast implants. Comminuted left distal clavicular fracture with inferior angulation of the distal fracture fragment. IMPRESSION: Large left tension pneumothorax occupying approximately 40% of the volume of the left hemithorax. Pulmonary  contusion underneath displaced fourth and fifth left lateral rib fractures. Additional nondisplaced left lateral third rib fracture. Small amount of left lateral chest wall emphysema. Confluent subpleural soft tissue thickening in the right lung apex. Patient has a history of treated right breast cancer, and these findings may represent post radiation changes. Pulmonary malignancy cannot be entirely excluded. Non-contrast chest CT at 3-6 months is recommended. If the nodules are stable at time of repeat CT, then future CT at 18-24 months (from today's scan) is considered optional for low-risk patients, but is recommended for high-risk patients. This recommendation follows the consensus statement: Guidelines for Management of Incidental Pulmonary Nodules Detected on CT Images: From the Fleischner Society 2017; Radiology 2017; 284:228-243. Two areas of subpleural soft tissue thickening in the left lung apex and left lung base. Attention on future follow-up. Mildly enlarged heart. Calcific atherosclerotic disease of the coronary arteries and aorta. Comminuted slightly angulated distal left clavicular fracture. Critical Value/emergent results were called by telephone at the time of interpretation on 03/02/2018 at 10:50 pm to Dr. Elnora Morrison , who verbally acknowledged these results. Aortic Atherosclerosis (ICD10-I70.0). Electronically Signed   By: Fidela Salisbury M.D.   On: 02/02/2018 23:13   Dg Chest Portable 1 View  Result Date: 03/04/2018 CLINICAL DATA:  Status post chest tube insertion. EXAM: PORTABLE CHEST 1 VIEW COMPARISON:  CT chest March 30th 2019 FINDINGS: Interval placement of LEFT pigtailed chest tube. Partially re-expanded LEFT lung with small residual LEFT apical pneumothorax. Biapical pleural thickening. No mediastinal shift. Cardiac silhouette is mildly enlarged. Calcified aortic arch. Calcified breast implants. Acute LEFT fourth rib fracture, additional acute rib fractures better seen on prior  radiograph. Acute comminuted distal LEFT clavicle fracture. Old LEFT rib fractures. Small volume LEFT chest wall subcutaneous emphysema. IMPRESSION: Interval placement LEFT chest tube with small residual LEFT apical pneumothorax. Aortic Atherosclerosis (ICD10-I70.0). Electronically Signed   By: Elon Alas M.D.   On: 03/04/2018 00:45   Dg Chest Portable 1 View  Result Date: 02/03/2018 CLINICAL DATA:  84 year old female with fall and left rib pain. EXAM: PORTABLE CHEST 1 VIEW COMPARISON:  Chest radiograph dated 09/18/2017 FINDINGS: The lungs are clear. There is no pleural effusion or pneumothorax. Biapical pleural thickening/scarring. The cardiac silhouette is within normal limits. Bilateral breast implants. There is osteopenia. There is apparent discontinuity of the left third and fourth ribs. A cardiac monitor lead overlies this area. Dedicated left rib series may provide better evaluation. There is a small subcutaneous air in the left chest wall. IMPRESSION: Fractures of the left third and fourth ribs. Electronically Signed   By: Anner Crete M.D.   On: 02/08/2018 21:24   Dg Shoulder Left Portable  Addendum Date: 02/20/2018   ADDENDUM REPORT: 02/13/2018 21:28 ADDENDUM: Displaced left fourth and fifth lateral rib fractures, with pleural subpleural contusion. Suspected small left pneumothorax. Dedicated chest radiograph, right-side-down may be considered for confirmation. Soft tissue emphysema along the left lateral chest wall. Electronically Signed  By: Fidela Salisbury M.D.   On: 03/02/2018 21:28   Result Date: 03/02/2018 CLINICAL DATA:  Fall with left rib and shoulder pain. EXAM: LEFT SHOULDER - 1 VIEW COMPARISON:  None. FINDINGS: Comminuted mildly inferiorly angulated fracture of the distal clavicle. Mild widening of the [facility] joint measuring 5 mm. No evidence of humeral or glenoid fracture. The glenohumeral joint is normally located. IMPRESSION: Comminuted mildly angulated distal clavicular  fracture with widening of the AC joint. Electronically Signed: By: Fidela Salisbury M.D. On: 02/05/2018 21:22   Scheduled Meds: . acetaminophen  1,000 mg Oral Q8H  . amiodarone  200 mg Oral BID  . apixaban  2.5 mg Oral BID  . [START ON 03/05/2018] levothyroxine  25 mcg Oral QAC breakfast  . metoprolol succinate  50 mg Oral Daily  . tamoxifen  20 mg Oral Daily   Continuous Infusions:   LOS: 1 day    Time spent: 35 min  Velvet Bathe, MD [facility] Pager [phone]  If 7PM-7AM, please contact night-coverage [email or web address] Password [facility] 03/04/2018, 3:23 PM

## 2018-03-05 ENCOUNTER — Inpatient Hospital Stay (HOSPITAL_COMMUNITY): Payer: Medicare Other

## 2018-03-05 DIAGNOSIS — R55 Syncope and collapse: Secondary | ICD-10-CM

## 2018-03-05 LAB — TROPONIN I: Troponin I: 0.04 ng/mL (ref <0.03)

## 2018-03-05 MED ORDER — ORAL CARE MOUTH RINSE
15.0000 mL | Freq: Two times a day (BID) | OROMUCOSAL | Status: DC
  Administered 2018-03-05 – 2018-03-12 (×14): 15 mL via OROMUCOSAL

## 2018-03-05 NOTE — Progress Notes (Signed)
Brought advance directives info but patient was not feeling up to doing it today.  She asked me to return on Wednesday because there are lots of things happening.  She hopes to have relief more from the pain and feel like talking about the AD,   Had a nice visit and she shared with me about her [employer] from [employer] over the years.  Will look forward to seeing her Wednesday regarding the Advance Directive forms. Conard Novak, Chaplain    03/05/18 1300  Clinical Encounter Type  Visited With Patient  Visit Type Initial;Spiritual support;Other (Comment) (advance directive forms)  Referral From Nurse  Consult/Referral To Chaplain  Spiritual Encounters  Spiritual Needs Prayer;Emotional  Stress Factors  Patient Stress Factors Other (Comment) (in lots of pain)

## 2018-03-05 NOTE — Progress Notes (Signed)
Carotid artery duplex has been completed. 1-39% ICA stenosis bilaterally.  03/05/18 11:43 AM Brooke Conley RVT

## 2018-03-05 NOTE — Progress Notes (Signed)
[facility]/Trauma Progress Note      Assessment/Plan Principal Problem:   Pneumothorax Active Problems:   Hypertension   Hypothyroid   Syncope   CKD (chronic kidney disease) stage 3, GFR 30-59 ml/min (HCC)  Fall L 3-5 rib fractures L sided PTX - chest xray 03/31 showed resolution of PTX - CT to H2O seal - pt pulling <250 on IS L clavicle fracture - nonweight bearing and non-op per ortho - sling for comfort and ROM as tolerated  FEN: heart healthy diet VTE: SCD's, home Eliquis ID: none Follow up: [facility] 2-3 weeks, trauma clinic 2 weeks after discharge  DISPO: PT/OT ordered, added tramadol for pain control, CT to water seal, repeat chest xray tomorrow am and if no residual PTX will pull tube then. Concerned for decline in respiratory status if pt cannot pull more on IS. Encourage IS and ambulation.     LOS: 2 days    Subjective: CC: L rib and clavicle pain  Pain is not well controlled. Pt did not have an IS in her room. I instructed pt on how to use and the importance of using it. She has not been out of bed. No family at bedside.   Objective: Vital signs in last 24 hours: Temp:  [97.7 F (36.5 C)-99 F (37.2 C)] 99 F (37.2 C) (04/01 0733) Pulse Rate:  [61-89] 70 (03/31 1848) Resp:  [13-25] 18 (04/01 0733) BP: (110-206)/(46-90) 137/63 (04/01 0733) SpO2:  [90 %-100 %] 99 % (04/01 0733) FiO2 (%):  [3 %] 3 % (03/31 1848) Weight:  [102 lb 8 oz (46.5 kg)] 102 lb 8 oz (46.5 kg) (03/31 1848) Last BM Date: 03/01/18  Intake/Output from previous day: 03/31 0701 - 04/01 0700 In: 480 [P.O.:480] Out: 150 [Urine:150] Intake/Output this shift: No intake/output data recorded.  PE: Gen:  Alert, NAD, pleasant, cooperative, thin frail elderly woman Card:  RRR,  Pulm:  CTA anteriorly, no W/R/R, rate and effort normal, no air leak seen, CT in place to -20, pulling <250 on IS Skin: no rashes noted, warm and dry   Anti-infectives: Anti-infectives (From  admission, onward)   None      Lab Results:  Recent Labs    02/20/2018 2041 03/04/18 1041  WBC 10.7* 7.8  HGB 12.2 12.5  HCT 37.1 37.8  PLT 146* 152   BMET Recent Labs    02/17/2018 2041 03/04/18 1041  NA 136 135  K 4.1 3.5  CL 99* 99*  CO2 24 23  GLUCOSE 161* 116*  BUN 26* 23*  CREATININE 1.70* 1.59*  CALCIUM 9.2 8.8*   PT/INR No results for input(s): LABPROT, INR in the last 72 hours. CMP     Component Value Date/Time   NA 135 03/04/2018 1041   K 3.5 03/04/2018 1041   CL 99 (L) 03/04/2018 1041   CO2 23 03/04/2018 1041   GLUCOSE 116 (H) 03/04/2018 1041   BUN 23 (H) 03/04/2018 1041   CREATININE 1.59 (H) 03/04/2018 1041   CREATININE 1.40 (H) 08/31/2017 1744   CALCIUM 8.8 (L) 03/04/2018 1041   PROT 6.7 03/04/2018 1041   ALBUMIN 3.4 (L) 03/04/2018 1041   AST 31 03/04/2018 1041   ALT 23 03/04/2018 1041   ALKPHOS 63 03/04/2018 1041   BILITOT 1.0 03/04/2018 1041   GFRNONAA 28 (L) 03/04/2018 1041   GFRNONAA 33 (L) 08/31/2017 1744   GFRAA 32 (L) 03/04/2018 1041   GFRAA 39 (L) 08/31/2017 1744   Lipase  No results found for: LIPASE  Studies/Results: Dg Clavicle Left  Addendum Date: 02/23/2018   ADDENDUM REPORT: 02/13/2018 23:15 ADDENDUM: These results were called by telephone at the time of interpretation on 02/16/2018 at 9:48 p.m. to Dr. Elnora Morrison , who verbally acknowledged these results. Electronically Signed   By: Fidela Salisbury M.D.   On: 02/15/2018 23:15   Result Date: 02/02/2018 CLINICAL DATA:  Status post fall with left shoulder pain. EXAM: LEFT CLAVICLE - 2+ VIEWS COMPARISON:  None. FINDINGS: Comminuted mildly inferiorly angulated fracture of the distal clavicle. Mild widening of the [facility] joint measuring 5 mm. No evidence of humeral or glenoid fracture. The glenohumeral joint is normally located. Displaced left fourth and fifth lateral rib fractures. Soft tissue pleural/subpleural thickening underneath the rib fractures may represent pulmonary contusion.  Small pneumothorax cannot be excluded. Soft tissue emphysema along the left lateral chest wall. IMPRESSION: Comminuted mildly angulated distal clavicular fracture with widening of the AC joint. Displaced left fourth and fifth lateral rib fractures, with pleural subpleural contusion. Suspected small left pneumothorax. Dedicated chest radiograph, right-side-down may be considered for confirmation. Soft tissue emphysema along the left lateral chest wall. Electronically Signed: By: Fidela Salisbury M.D. On: 02/27/2018 21:27   Ct Head Wo Contrast  Result Date: 02/18/2018 CLINICAL DATA:  Head trauma while on Eliquis. EXAM: CT HEAD WITHOUT CONTRAST TECHNIQUE: Contiguous axial images were obtained from the base of the skull through the vertex without intravenous contrast. COMPARISON:  Brain MRI 09/19/2017 FINDINGS: Brain: No mass lesion, intraparenchymal hemorrhage or extra-axial collection. No evidence of acute cortical infarct. Minimal white matter hypoattenuation. 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 Vascular: No hyperdense vessel or unexpected vascular calcification. Skull: Normal visualized skull base, calvarium and extracranial soft tissues. Sinuses/Orbits: No sinus fluid levels or advanced mucosal thickening. No mastoid effusion. Normal orbits. IMPRESSION: Mild chronic small vessel disease without acute abnormality. Old right cerebellar infarct. Electronically Signed   By: Ulyses Jarred M.D.   On: 02/15/2018 20:33   Ct Chest Wo Contrast  Result Date: 03/02/2018 CLINICAL DATA:  Status post fall.  Chest pain. EXAM: CT CHEST WITHOUT CONTRAST TECHNIQUE: Multidetector CT imaging of the chest was performed following the standard protocol without IV contrast. COMPARISON:  Chest radiograph from the same date. FINDINGS: Cardiovascular: The mediastinal structures are displaced to the right. Mildly enlarged heart. Calcific atherosclerotic disease of the coronary arteries and aorta. Mediastinum/Nodes: No  mediastinal lymph nodes. Lungs/Pleura: There is a large left pneumothorax, occupying approximately 40% of the volume of the left hemithorax, with evidence of tension effect. Pulmonary contusion is seen adjacent to the displaced fourth and fifth left lateral rib fractures. There is a soft tissue thickening in the left apex and left lung base, measuring respectively 0.9 and 1.8 cm. More confluent subpleural soft tissue thickening with pleural calcifications is seen in the right lung apex measuring 1.7 x 5.1 cm. Upper Abdomen: No acute abnormality. Musculoskeletal: Displaced impacted left fourth and fifth lateral rib fractures, likely the cause of the left tension pneumothorax. Nondisplaced left third lateral rib fracture. Small soft tissue emphysema along the lateral left chest wall. Bilateral calcified breast implants. Comminuted left distal clavicular fracture with inferior angulation of the distal fracture fragment. IMPRESSION: Large left tension pneumothorax occupying approximately 40% of the volume of the left hemithorax. Pulmonary contusion underneath displaced fourth and fifth left lateral rib fractures. Additional nondisplaced left lateral third rib fracture. Small amount of left lateral chest wall emphysema. Confluent subpleural soft tissue thickening in the right lung apex. Patient has a history  of treated right breast cancer, and these findings may represent post radiation changes. Pulmonary malignancy cannot be entirely excluded. Non-contrast chest CT at 3-6 months is recommended. If the nodules are stable at time of repeat CT, then future CT at 18-24 months (from today's scan) is considered optional for low-risk patients, but is recommended for high-risk patients. This recommendation follows the consensus statement: Guidelines for Management of Incidental Pulmonary Nodules Detected on CT Images: From the Fleischner Society 2017; Radiology 2017; 284:228-243. Two areas of subpleural soft tissue thickening  in the left lung apex and left lung base. Attention on future follow-up. Mildly enlarged heart. Calcific atherosclerotic disease of the coronary arteries and aorta. Comminuted slightly angulated distal left clavicular fracture. Critical Value/emergent results were called by telephone at the time of interpretation on 02/21/2018 at 10:50 pm to Dr. Elnora Morrison , who verbally acknowledged these results. Aortic Atherosclerosis (ICD10-I70.0). Electronically Signed   By: Fidela Salisbury M.D.   On: 02/08/2018 23:13   Dg Chest Port 1 View  Result Date: 03/05/2018 CLINICAL DATA:  Left-sided pneumothorax.  Left chest tube in place. EXAM: PORTABLE CHEST 1 VIEW COMPARISON:  02/22/2018 FINDINGS: Unchanged position of left-sided chest tube projecting over the posterior left seventh rib. The previously noted tiny left apical pneumothorax is not apparent on current exam. Biapical crescentic opacities are stable possibly representing fluid at the apice versus pleuroparenchymal thickening. Stable cardiomegaly with aortic atherosclerosis. Displaced left-sided rib fractures involving the posterior fourth and fifth ribs are redemonstrated. The left lateral third rib fracture seen on CT is obscured by rib overlap. Small amount of subcutaneous emphysema from chest tube insertion. Calcified bilateral breast implants. IMPRESSION: 1. Left-sided chest tube remains in place. No progression of left-sided pneumothorax. The previous tiny left apical pneumothorax is not radiographically apparent. 2. Redemonstration acute displaced left fourth and fifth posterior rib fractures. Electronically Signed   By: Ashley Royalty M.D.   On: 03/05/2018 03:37   Dg Chest Portable 1 View  Result Date: 03/04/2018 CLINICAL DATA:  Status post chest tube insertion. EXAM: PORTABLE CHEST 1 VIEW COMPARISON:  CT chest March 30th 2019 FINDINGS: Interval placement of LEFT pigtailed chest tube. Partially re-expanded LEFT lung with small residual LEFT apical  pneumothorax. Biapical pleural thickening. No mediastinal shift. Cardiac silhouette is mildly enlarged. Calcified aortic arch. Calcified breast implants. Acute LEFT fourth rib fracture, additional acute rib fractures better seen on prior radiograph. Acute comminuted distal LEFT clavicle fracture. Old LEFT rib fractures. Small volume LEFT chest wall subcutaneous emphysema. IMPRESSION: Interval placement LEFT chest tube with small residual LEFT apical pneumothorax. Aortic Atherosclerosis (ICD10-I70.0). Electronically Signed   By: Elon Alas M.D.   On: 03/04/2018 00:45   Dg Chest Portable 1 View  Result Date: 02/03/2018 CLINICAL DATA:  84 year old female with fall and left rib pain. EXAM: PORTABLE CHEST 1 VIEW COMPARISON:  Chest radiograph dated 09/18/2017 FINDINGS: The lungs are clear. There is no pleural effusion or pneumothorax. Biapical pleural thickening/scarring. The cardiac silhouette is within normal limits. Bilateral breast implants. There is osteopenia. There is apparent discontinuity of the left third and fourth ribs. A cardiac monitor lead overlies this area. Dedicated left rib series may provide better evaluation. There is a small subcutaneous air in the left chest wall. IMPRESSION: Fractures of the left third and fourth ribs. Electronically Signed   By: Anner Crete M.D.   On: 02/23/2018 21:24   Dg Shoulder Left Portable  Addendum Date: 02/07/2018   ADDENDUM REPORT: 02/05/2018 21:28 ADDENDUM: Displaced left fourth and  fifth lateral rib fractures, with pleural subpleural contusion. Suspected small left pneumothorax. Dedicated chest radiograph, right-side-down may be considered for confirmation. Soft tissue emphysema along the left lateral chest wall. Electronically Signed   By: Fidela Salisbury M.D.   On: 02/26/2018 21:28   Result Date: 02/14/2018 CLINICAL DATA:  Fall with left rib and shoulder pain. EXAM: LEFT SHOULDER - 1 VIEW COMPARISON:  None. FINDINGS: Comminuted mildly  inferiorly angulated fracture of the distal clavicle. Mild widening of the [facility] joint measuring 5 mm. No evidence of humeral or glenoid fracture. The glenohumeral joint is normally located. IMPRESSION: Comminuted mildly angulated distal clavicular fracture with widening of the AC joint. Electronically Signed: By: Fidela Salisbury M.D. On: 02/23/2018 21:22      Kalman Drape , [facility] 03/05/2018, 8:33 AM  Pager: [phone] Mon-Wed, Friday 7:00am-4:30pm Thurs 7am-11:30am

## 2018-03-05 NOTE — Progress Notes (Signed)
PROGRESS NOTE    Brooke Conley  ZOX:[id number] DOB: 1927/04/20 DOA: 02/22/2018 PCP: [name], MD    Brief Narrative:   84 y.o. female, w h/o breast/ uterine cancer, hypertension, hyperlipidemia who presents with c/o fall and ? Syncope. Pt denies presyncopal symptoms such as cp, palp, sob, n/v, diarrhea, brbpr, focal weakness, numbness, tingling. pt was brought to ED for  Evaluation of fall  Assessment & Plan:  Pneumothorax/ left distal clavicular fracture, rib fracture L 3-5 Chest tube placed by surgeon.  Trauma surgery on board and managing CT - for clavicular fx to surgery found plan is for sling  Hypertension  Continue current regimen - Hydralazine 10mg  iv q6h prn - Cont metoprolol xl 50mg  po qday  Pafib Cont Amiodarone Cont Metoprolol XL 50mg  po qday Cont Eliquis 2.5mg  po bid  Hypothyroidism - Cont Levothyroxine  H/o breast cancer - Cont Tamoxifen  Lung nodules Recommendation on CT to repeat CT chest noncontrast in 3-6 months   DVT prophylaxis: Eliquis Code Status: Full Family Communication: none at bedside.  Disposition Plan: pending improvement in condition and recommendations by surgeons   Consultants:   Orthopaedic  General surgery   Procedures: none   Antimicrobials: none  Subjective: No acute issues reported overnight.   Objective: Vitals:   03/04/18 2330 03/05/18 0431 03/05/18 0733 03/05/18 0852  BP: (!) 110/55 129/66 137/63 (!) 130/58  Pulse:    78  Resp:   18   Temp: 97.7 F (36.5 C) 98.6 F (37 C) 99 F (37.2 C)   TempSrc: Oral Oral Oral   SpO2: 98% 99% 99%   Weight:      Height:        Intake/Output Summary (Last 24 hours) at 03/05/2018 1359 Last data filed at 03/05/2018 1100 Gross per 24 hour  Intake 900 ml  Output 150 ml  Net 750 ml   Filed Weights   02/15/2018 1905 03/04/18 1848  Weight: 47.6 kg (105 lb) 46.5 kg (102 lb 8 oz)    Examination:  General exam: Pt in nad. Respiratory system: decreased  breath sounds over left lung base.  Cardiovascular system: S1 & S2 heard, RRR.  Gastrointestinal system: Abdomen is nondistended, soft and nontender. No organomegaly or masses felt. Normal bowel sounds heard. Central nervous system: Alert and oriented. No focal neurological deficits. Extremities: Symmetric 5 x 5 power. Skin: warm and dry Psychiatry: Mood & affect appropriate.   Data Reviewed: I have personally reviewed following labs and imaging studies  CBC: Recent Labs  Lab 02/07/2018 2041 03/04/18 1041  WBC 10.7* 7.8  HGB 12.2 12.5  HCT 37.1 37.8  MCV 94.9 94.5  PLT 146* 811   Basic Metabolic Panel: Recent Labs  Lab 02/11/2018 2041 02/19/2018 2145 03/04/18 1041  NA 136  --  135  K 4.1  --  3.5  CL 99*  --  99*  CO2 24  --  23  GLUCOSE 161*  --  116*  BUN 26*  --  23*  CREATININE 1.70*  --  1.59*  CALCIUM 9.2  --  8.8*  MG  --  2.0  --    GFR: Estimated Creatinine Clearance: 18 mL/min (A) (by C-G formula based on SCr of 1.59 mg/dL (H)). Liver Function Tests: Recent Labs  Lab 03/04/18 1041  AST 31  ALT 23  ALKPHOS 63  BILITOT 1.0  PROT 6.7  ALBUMIN 3.4*   No results for input(s): LIPASE, AMYLASE in the last 168 hours. No results for input(s):  AMMONIA in the last 168 hours. Coagulation Profile: No results for input(s): INR, PROTIME in the last 168 hours. Cardiac Enzymes: Recent Labs  Lab 03/04/18 1041 03/04/18 1910 03/05/18 0213  CKTOTAL 149  --   --   CKMB 2.2  --   --   TROPONINI 0.03* 0.03* 0.04*   BNP (last 3 results) No results for input(s): PROBNP in the last 8760 hours. HbA1C: No results for input(s): HGBA1C in the last 72 hours. CBG: Recent Labs  Lab 02/04/2018 2016  GLUCAP 178*   Lipid Profile: No results for input(s): CHOL, HDL, LDLCALC, TRIG, CHOLHDL, LDLDIRECT in the last 72 hours. Thyroid Function Tests: Recent Labs    03/04/18 1041  TSH 41.947*   Anemia Panel: No results for input(s): VITAMINB12, FOLATE, FERRITIN, TIBC, IRON,  RETICCTPCT in the last 72 hours. Sepsis Labs: No results for input(s): PROCALCITON, LATICACIDVEN in the last 168 hours.  Recent Results (from the past 240 hour(s))  MRSA PCR Screening     Status: None   Collection Time: 03/04/18  6:59 PM  Result Value Ref Range Status   MRSA by PCR NEGATIVE NEGATIVE Final    Comment:        The GeneXpert MRSA Assay (FDA approved for NASAL specimens only), is one component of a comprehensive MRSA colonization surveillance program. It is not intended to diagnose MRSA infection nor to guide or monitor treatment for MRSA infections. Performed at [facility], [place] [address] [place], [place] [address]          Radiology Studies: Dg Clavicle Left  Addendum Date: 02/27/2018   ADDENDUM REPORT: 02/10/2018 23:15 ADDENDUM: These results were called by telephone at the time of interpretation on 02/18/2018 at 9:48 p.m. to Dr. Elnora Morrison , who verbally acknowledged these results. Electronically Signed   By: Fidela Salisbury M.D.   On: 02/06/2018 23:15   Result Date: 02/02/2018 CLINICAL DATA:  Status post fall with left shoulder pain. EXAM: LEFT CLAVICLE - 2+ VIEWS COMPARISON:  None. FINDINGS: Comminuted mildly inferiorly angulated fracture of the distal clavicle. Mild widening of the [facility] joint measuring 5 mm. No evidence of humeral or glenoid fracture. The glenohumeral joint is normally located. Displaced left fourth and fifth lateral rib fractures. Soft tissue pleural/subpleural thickening underneath the rib fractures may represent pulmonary contusion. Small pneumothorax cannot be excluded. Soft tissue emphysema along the left lateral chest wall. IMPRESSION: Comminuted mildly angulated distal clavicular fracture with widening of the AC joint. Displaced left fourth and fifth lateral rib fractures, with pleural subpleural contusion. Suspected small left pneumothorax. Dedicated chest radiograph, right-side-down may be considered for confirmation. Soft  tissue emphysema along the left lateral chest wall. Electronically Signed: By: Fidela Salisbury M.D. On: 02/18/2018 21:27   Ct Head Wo Contrast  Result Date: 02/17/2018 CLINICAL DATA:  Head trauma while on Eliquis. EXAM: CT HEAD WITHOUT CONTRAST TECHNIQUE: Contiguous axial images were obtained from the base of the skull through the vertex without intravenous contrast. COMPARISON:  Brain MRI 09/19/2017 FINDINGS: Brain: No mass lesion, intraparenchymal hemorrhage or extra-axial collection. No evidence of acute cortical infarct. Minimal white matter hypoattenuation. 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 Vascular: No hyperdense vessel or unexpected vascular calcification. Skull: Normal visualized skull base, calvarium and extracranial soft tissues. Sinuses/Orbits: No sinus fluid levels or advanced mucosal thickening. No mastoid effusion. Normal orbits. IMPRESSION: Mild chronic small vessel disease without acute abnormality. Old right cerebellar infarct. Electronically Signed   By: Ulyses Jarred M.D.   On:  02/27/2018 20:33   Ct Chest Wo Contrast  Result Date: 02/09/2018 CLINICAL DATA:  Status post fall.  Chest pain. EXAM: CT CHEST WITHOUT CONTRAST TECHNIQUE: Multidetector CT imaging of the chest was performed following the standard protocol without IV contrast. COMPARISON:  Chest radiograph from the same date. FINDINGS: Cardiovascular: The mediastinal structures are displaced to the right. Mildly enlarged heart. Calcific atherosclerotic disease of the coronary arteries and aorta. Mediastinum/Nodes: No mediastinal lymph nodes. Lungs/Pleura: There is a large left pneumothorax, occupying approximately 40% of the volume of the left hemithorax, with evidence of tension effect. Pulmonary contusion is seen adjacent to the displaced fourth and fifth left lateral rib fractures. There is a soft tissue thickening in the left apex and left lung base, measuring respectively 0.9 and 1.8 cm. More confluent subpleural  soft tissue thickening with pleural calcifications is seen in the right lung apex measuring 1.7 x 5.1 cm. Upper Abdomen: No acute abnormality. Musculoskeletal: Displaced impacted left fourth and fifth lateral rib fractures, likely the cause of the left tension pneumothorax. Nondisplaced left third lateral rib fracture. Small soft tissue emphysema along the lateral left chest wall. Bilateral calcified breast implants. Comminuted left distal clavicular fracture with inferior angulation of the distal fracture fragment. IMPRESSION: Large left tension pneumothorax occupying approximately 40% of the volume of the left hemithorax. Pulmonary contusion underneath displaced fourth and fifth left lateral rib fractures. Additional nondisplaced left lateral third rib fracture. Small amount of left lateral chest wall emphysema. Confluent subpleural soft tissue thickening in the right lung apex. Patient has a history of treated right breast cancer, and these findings may represent post radiation changes. Pulmonary malignancy cannot be entirely excluded. Non-contrast chest CT at 3-6 months is recommended. If the nodules are stable at time of repeat CT, then future CT at 18-24 months (from today's scan) is considered optional for low-risk patients, but is recommended for high-risk patients. This recommendation follows the consensus statement: Guidelines for Management of Incidental Pulmonary Nodules Detected on CT Images: From the Fleischner Society 2017; Radiology 2017; 284:228-243. Two areas of subpleural soft tissue thickening in the left lung apex and left lung base. Attention on future follow-up. Mildly enlarged heart. Calcific atherosclerotic disease of the coronary arteries and aorta. Comminuted slightly angulated distal left clavicular fracture. Critical Value/emergent results were called by telephone at the time of interpretation on 02/14/2018 at 10:50 pm to Dr. Elnora Morrison , who verbally acknowledged these results. Aortic  Atherosclerosis (ICD10-I70.0). Electronically Signed   By: Fidela Salisbury M.D.   On: 02/17/2018 23:13   Dg Chest Port 1 View  Result Date: 03/05/2018 CLINICAL DATA:  Left-sided pneumothorax.  Left chest tube in place. EXAM: PORTABLE CHEST 1 VIEW COMPARISON:  02/07/2018 FINDINGS: Unchanged position of left-sided chest tube projecting over the posterior left seventh rib. The previously noted tiny left apical pneumothorax is not apparent on current exam. Biapical crescentic opacities are stable possibly representing fluid at the apice versus pleuroparenchymal thickening. Stable cardiomegaly with aortic atherosclerosis. Displaced left-sided rib fractures involving the posterior fourth and fifth ribs are redemonstrated. The left lateral third rib fracture seen on CT is obscured by rib overlap. Small amount of subcutaneous emphysema from chest tube insertion. Calcified bilateral breast implants. IMPRESSION: 1. Left-sided chest tube remains in place. No progression of left-sided pneumothorax. The previous tiny left apical pneumothorax is not radiographically apparent. 2. Redemonstration acute displaced left fourth and fifth posterior rib fractures. Electronically Signed   By: Ashley Royalty M.D.   On: 03/05/2018 03:37  Dg Chest Portable 1 View  Result Date: 03/04/2018 CLINICAL DATA:  Status post chest tube insertion. EXAM: PORTABLE CHEST 1 VIEW COMPARISON:  CT chest March 30th 2019 FINDINGS: Interval placement of LEFT pigtailed chest tube. Partially re-expanded LEFT lung with small residual LEFT apical pneumothorax. Biapical pleural thickening. No mediastinal shift. Cardiac silhouette is mildly enlarged. Calcified aortic arch. Calcified breast implants. Acute LEFT fourth rib fracture, additional acute rib fractures better seen on prior radiograph. Acute comminuted distal LEFT clavicle fracture. Old LEFT rib fractures. Small volume LEFT chest wall subcutaneous emphysema. IMPRESSION: Interval placement LEFT chest  tube with small residual LEFT apical pneumothorax. Aortic Atherosclerosis (ICD10-I70.0). Electronically Signed   By: Elon Alas M.D.   On: 03/04/2018 00:45   Dg Chest Portable 1 View  Result Date: 02/04/2018 CLINICAL DATA:  84 year old female with fall and left rib pain. EXAM: PORTABLE CHEST 1 VIEW COMPARISON:  Chest radiograph dated 09/18/2017 FINDINGS: The lungs are clear. There is no pleural effusion or pneumothorax. Biapical pleural thickening/scarring. The cardiac silhouette is within normal limits. Bilateral breast implants. There is osteopenia. There is apparent discontinuity of the left third and fourth ribs. A cardiac monitor lead overlies this area. Dedicated left rib series may provide better evaluation. There is a small subcutaneous air in the left chest wall. IMPRESSION: Fractures of the left third and fourth ribs. Electronically Signed   By: Anner Crete M.D.   On: 03/02/2018 21:24   Dg Shoulder Left Portable  Addendum Date: 02/24/2018   ADDENDUM REPORT: 02/06/2018 21:28 ADDENDUM: Displaced left fourth and fifth lateral rib fractures, with pleural subpleural contusion. Suspected small left pneumothorax. Dedicated chest radiograph, right-side-down may be considered for confirmation. Soft tissue emphysema along the left lateral chest wall. Electronically Signed   By: Fidela Salisbury M.D.   On: 02/17/2018 21:28   Result Date: 03/01/2018 CLINICAL DATA:  Fall with left rib and shoulder pain. EXAM: LEFT SHOULDER - 1 VIEW COMPARISON:  None. FINDINGS: Comminuted mildly inferiorly angulated fracture of the distal clavicle. Mild widening of the [facility] joint measuring 5 mm. No evidence of humeral or glenoid fracture. The glenohumeral joint is normally located. IMPRESSION: Comminuted mildly angulated distal clavicular fracture with widening of the AC joint. Electronically Signed: By: Fidela Salisbury M.D. On: 02/26/2018 21:22   Scheduled Meds: . acetaminophen  1,000 mg Oral Q8H  .  amiodarone  200 mg Oral BID  . apixaban  2.5 mg Oral BID  . levothyroxine  25 mcg Oral QAC breakfast  . mouth rinse  15 mL Mouth Rinse BID  . metoprolol succinate  50 mg Oral Daily  . tamoxifen  20 mg Oral Daily   Continuous Infusions:   LOS: 2 days    Time spent: 35 min  Velvet Bathe, MD [facility] Pager [phone]  If 7PM-7AM, please contact night-coverage [email or web address] Password [email or web address] 03/05/2018, 1:59 PM

## 2018-03-05 DEATH — deceased

## 2018-03-06 ENCOUNTER — Inpatient Hospital Stay (HOSPITAL_COMMUNITY): Payer: Medicare Other

## 2018-03-06 ENCOUNTER — Other Ambulatory Visit: Payer: Self-pay

## 2018-03-06 LAB — BASIC METABOLIC PANEL
Anion gap: 9 (ref 5–15)
BUN: 36 mg/dL — AB (ref 6–20)
CHLORIDE: 99 mmol/L — AB (ref 101–111)
CO2: 24 mmol/L (ref 22–32)
CREATININE: 1.76 mg/dL — AB (ref 0.44–1.00)
Calcium: 8.8 mg/dL — ABNORMAL LOW (ref 8.9–10.3)
GFR calc Af Amer: 29 mL/min — ABNORMAL LOW (ref >60)
GFR calc non Af Amer: 25 mL/min — ABNORMAL LOW (ref >60)
Glucose, Bld: 201 mg/dL — ABNORMAL HIGH (ref 65–99)
Potassium: 3.9 mmol/L (ref 3.5–5.1)
SODIUM: 132 mmol/L — AB (ref 135–145)

## 2018-03-06 LAB — GLUCOSE, CAPILLARY: GLUCOSE-CAPILLARY: 132 mg/dL — AB (ref 65–99)

## 2018-03-06 MED ORDER — OXYCODONE HCL 5 MG PO TABS: 2.5000 mg | ORAL_TABLET | ORAL | Status: DC | PRN

## 2018-03-06 MED ORDER — IBUPROFEN 600 MG PO TABS
600.0000 mg | ORAL_TABLET | Freq: Four times a day (QID) | ORAL | Status: DC | PRN
  Administered 2018-03-08 – 2018-03-10 (×2): 600 mg via ORAL
  Filled 2018-03-06 (×2): qty 1

## 2018-03-06 MED ORDER — ACETAMINOPHEN 325 MG PO TABS
650.0000 mg | ORAL_TABLET | Freq: Four times a day (QID) | ORAL | Status: DC | PRN
  Administered 2018-03-11: 650 mg via ORAL
  Filled 2018-03-06 (×3): qty 2

## 2018-03-06 MED ORDER — NALOXONE HCL 0.4 MG/ML IJ SOLN
INTRAMUSCULAR | Status: AC
  Administered 2018-03-06: 0.4 mg
  Filled 2018-03-06: qty 2

## 2018-03-06 MED ORDER — VANCOMYCIN HCL 500 MG IV SOLR
500.0000 mg | INTRAVENOUS | Status: DC
  Administered 2018-03-06: 500 mg via INTRAVENOUS
  Filled 2018-03-06: qty 500

## 2018-03-06 MED ORDER — ACETAMINOPHEN 325 MG RE SUPP
325.0000 mg | RECTAL | Status: DC | PRN
  Administered 2018-03-06 – 2018-03-07 (×3): 325 mg via RECTAL
  Filled 2018-03-06 (×3): qty 1

## 2018-03-06 MED ORDER — SODIUM CHLORIDE 0.9 % IV SOLN
1.0000 g | INTRAVENOUS | Status: DC
  Administered 2018-03-06 – 2018-03-12 (×7): 1 g via INTRAVENOUS
  Filled 2018-03-06 (×7): qty 1

## 2018-03-06 MED ORDER — ALBUTEROL SULFATE (2.5 MG/3ML) 0.083% IN NEBU
2.5000 mg | INHALATION_SOLUTION | RESPIRATORY_TRACT | Status: DC | PRN
  Administered 2018-03-06: 2.5 mg via RESPIRATORY_TRACT
  Filled 2018-03-06 (×2): qty 3

## 2018-03-06 MED ORDER — NALOXONE HCL 0.4 MG/ML IJ SOLN
0.4000 mg | INTRAMUSCULAR | Status: DC | PRN
  Administered 2018-03-06 (×2): 0.4 mg via INTRAVENOUS

## 2018-03-06 MED ORDER — NALOXONE HCL 0.4 MG/ML IJ SOLN
INTRAMUSCULAR | Status: AC
  Filled 2018-03-06: qty 1

## 2018-03-06 MED ORDER — MORPHINE SULFATE (PF) 4 MG/ML IV SOLN: 1.0000 mg | INTRAVENOUS | Status: DC | PRN

## 2018-03-06 NOTE — Progress Notes (Addendum)
PROGRESS NOTE    Brooke Conley  WNU:[id number] DOB: 06/26/1927 DOA: 02/06/2018 PCP: [name], MD    Brief Narrative:   84 y.o. female, w h/o breast/ uterine cancer, hypertension, hyperlipidemia who presents with c/o fall and ? Syncope. Pt denies presyncopal symptoms such as cp, palp, sob, n/v, diarrhea, brbpr, focal weakness, numbness, tingling. pt was brought to ED for  Evaluation of fall  Pt hospitalization complicated by FUO and altered mental status after receiving morphine overnight. Repeat ct scan of head negative.   Assessment & Plan:  Pneumothorax/ left distal clavicular fracture, rib fracture L 3-5 Chest tube placed by surgeon.  Trauma surgery on board and managing CT - for clavicular fx to surgery found plan is for sling  FUO addendum - given recent chest tube placement would favor treating  - will obtain blood cultures and urine culture prior to treatment with antibiotics  AMS/metabolic encephalopathy - secondary to delirium from morphine most likely, will discontinue - Use acetaminophen for discomfort or tramadol - CT scan of head negative.  - elevated TSH will obtain free T 3 levels prior to treating.  Hypertension  Continue current regimen, stable currently last systolic was 034'V - Hydralazine 10mg  iv q6h prn - Cont metoprolol xl 50mg  po qday  Pafib Cont Amiodarone Cont Metoprolol XL 50mg  po qday Cont Eliquis 2.5mg  po bid  Hypothyroidism - Cont Levothyroxine  H/o breast cancer - Cont Tamoxifen  Lung nodules Recommendation on CT to repeat CT chest noncontrast in 3-6 months   DVT prophylaxis: Eliquis Code Status: Full Family Communication: none at bedside.  Disposition Plan: pending recommendations from specialist   Consultants:   Orthopaedic  General surgery   Procedures: none   Antimicrobials: none  Subjective: Pt was somnolent and confused this am per my associate and nursing.   Objective: Vitals:   03/05/18 1618  03/05/18 2058 03/06/18 0011 03/06/18 0539  BP: (!) 177/70 (!) 155/84 (!) 147/71 (!) 210/98  Pulse:      Resp: 20     Temp: 98.8 F (37.1 C) 98.9 F (37.2 C) 98.6 F (37 C) 99.3 F (37.4 C)  TempSrc: Oral Oral Oral Oral  SpO2: 98% 99% 98% 92%  Weight:      Height:        Intake/Output Summary (Last 24 hours) at 03/06/2018 0856 Last data filed at 03/06/2018 0700 Gross per 24 hour  Intake 60 ml  Output 1000 ml  Net -940 ml   Filed Weights   02/27/2018 1905 03/04/18 1848  Weight: 47.6 kg (105 lb) 46.5 kg (102 lb 8 oz)    Examination:  General exam: Pt in nad. Alert and awake Respiratory system: decreased breath sounds over left lung base.  Cardiovascular system: S1 & S2 heard, RRR.  Gastrointestinal system: Abdomen is nondistended, soft and nontender. No organomegaly or masses felt. Normal bowel sounds heard. Central nervous system: Alert and oriented. No focal asymmetry. Gazes in my direction when I speak to her. Extremities: Symmetric 5 x 5 power. Skin: warm and dry Psychiatry: Mood & affect appropriate.   Data Reviewed: I have personally reviewed following labs and imaging studies  CBC: Recent Labs  Lab 02/16/2018 2041 03/04/18 1041  WBC 10.7* 7.8  HGB 12.2 12.5  HCT 37.1 37.8  MCV 94.9 94.5  PLT 146* 425   Basic Metabolic Panel: Recent Labs  Lab 03/04/2018 2041 02/22/2018 2145 03/04/18 1041  NA 136  --  135  K 4.1  --  3.5  CL 99*  --  99*  CO2 24  --  23  GLUCOSE 161*  --  116*  BUN 26*  --  23*  CREATININE 1.70*  --  1.59*  CALCIUM 9.2  --  8.8*  MG  --  2.0  --    GFR: Estimated Creatinine Clearance: 18 mL/min (A) (by C-G formula based on SCr of 1.59 mg/dL (H)). Liver Function Tests: Recent Labs  Lab 03/04/18 1041  AST 31  ALT 23  ALKPHOS 63  BILITOT 1.0  PROT 6.7  ALBUMIN 3.4*   No results for input(s): LIPASE, AMYLASE in the last 168 hours. No results for input(s): AMMONIA in the last 168 hours. Coagulation Profile: No results for input(s):  INR, PROTIME in the last 168 hours. Cardiac Enzymes: Recent Labs  Lab 03/04/18 1041 03/04/18 1910 03/05/18 0213  CKTOTAL 149  --   --   CKMB 2.2  --   --   TROPONINI 0.03* 0.03* 0.04*   BNP (last 3 results) No results for input(s): PROBNP in the last 8760 hours. HbA1C: No results for input(s): HGBA1C in the last 72 hours. CBG: Recent Labs  Lab 02/25/2018 2016 03/06/18 0645  GLUCAP 178* 132*   Lipid Profile: No results for input(s): CHOL, HDL, LDLCALC, TRIG, CHOLHDL, LDLDIRECT in the last 72 hours. Thyroid Function Tests: Recent Labs    03/04/18 1041  TSH 41.947*   Anemia Panel: No results for input(s): VITAMINB12, FOLATE, FERRITIN, TIBC, IRON, RETICCTPCT in the last 72 hours. Sepsis Labs: No results for input(s): PROCALCITON, LATICACIDVEN in the last 168 hours.  Recent Results (from the past 240 hour(s))  MRSA PCR Screening     Status: None   Collection Time: 03/04/18  6:59 PM  Result Value Ref Range Status   MRSA by PCR NEGATIVE NEGATIVE Final    Comment:        The GeneXpert MRSA Assay (FDA approved for NASAL specimens only), is one component of a comprehensive MRSA colonization surveillance program. It is not intended to diagnose MRSA infection nor to guide or monitor treatment for MRSA infections. Performed at [facility], [place] [address] [place], [place] [address]          Radiology Studies: Ct Head Wo Contrast  Result Date: 03/06/2018 CLINICAL DATA:  Focal neuro deficit, stroke suspected. Altered level of consciousness. EXAM: CT HEAD WITHOUT CONTRAST TECHNIQUE: Contiguous axial images were obtained from the base of the skull through the vertex without intravenous contrast. COMPARISON:  CT head 02/21/2018 FINDINGS: Brain: Mild atrophy stable. Negative for hydrocephalus. Chronic microvascular ischemic change in the white matter. Small chronic infarct right cerebellum unchanged. Negative for acute infarct, hemorrhage, mass. Vascular: Negative for  hyperdense vessel Skull: Negative Sinuses/Orbits: Negative sinuses.  Bilateral cataract replacement Other: None IMPRESSION: No acute intracranial abnormality. Mild chronic microvascular ischemic changes stable from the prior study. Electronically Signed   By: Franchot Gallo M.D.   On: 03/06/2018 08:13   Dg Chest Port 1 View  Result Date: 03/06/2018 CLINICAL DATA:  Recent pneumothorax EXAM: PORTABLE CHEST 1 VIEW COMPARISON:  March 04, 2018 FINDINGS: Chest tube remains on the left, positioned laterally with several side holes outside of the left pleural space. There is a persistent small left apical pneumothorax without tension component. There is apical pleural thickening bilaterally, stable. There is no edema or consolidation. Heart is upper normal in size with pulmonary vascularity within normal limits. There is aortic atherosclerosis. There are calcified breast implants bilaterally. IMPRESSION: Left chest tube is laterally position with  several sideholes lateral to the left pleural space. Small left apical pneumothorax persists without tension component. No edema or consolidation. Bilateral apical pleural thickening is stable. Stable cardiac silhouette.  There is aortic atherosclerosis. Aortic Atherosclerosis (ICD10-I70.0). Electronically Signed   By: Lowella Grip III M.D.   On: 03/06/2018 07:13   Dg Chest Port 1 View  Result Date: 03/05/2018 CLINICAL DATA:  Left-sided pneumothorax.  Left chest tube in place. EXAM: PORTABLE CHEST 1 VIEW COMPARISON:  02/18/2018 FINDINGS: Unchanged position of left-sided chest tube projecting over the posterior left seventh rib. The previously noted tiny left apical pneumothorax is not apparent on current exam. Biapical crescentic opacities are stable possibly representing fluid at the apice versus pleuroparenchymal thickening. Stable cardiomegaly with aortic atherosclerosis. Displaced left-sided rib fractures involving the posterior fourth and fifth ribs are  redemonstrated. The left lateral third rib fracture seen on CT is obscured by rib overlap. Small amount of subcutaneous emphysema from chest tube insertion. Calcified bilateral breast implants. IMPRESSION: 1. Left-sided chest tube remains in place. No progression of left-sided pneumothorax. The previous tiny left apical pneumothorax is not radiographically apparent. 2. Redemonstration acute displaced left fourth and fifth posterior rib fractures. Electronically Signed   By: Ashley Royalty M.D.   On: 03/05/2018 03:37   Scheduled Meds: . acetaminophen  1,000 mg Oral Q8H  . amiodarone  200 mg Oral BID  . apixaban  2.5 mg Oral BID  . levothyroxine  25 mcg Oral QAC breakfast  . mouth rinse  15 mL Mouth Rinse BID  . metoprolol succinate  50 mg Oral Daily  . naloxone      . tamoxifen  20 mg Oral Daily   Continuous Infusions:   LOS: 3 days    Time spent: 40 min rapid response called  Velvet Bathe, MD [facility] Pager [phone]  If 7PM-7AM, please contact night-coverage [email or web address] Password [facility] 03/06/2018, 8:56 AM

## 2018-03-06 NOTE — Significant Event (Addendum)
Rapid Response Event Note  Overview: Neurologic   Initial Focused Assessment: Called by staff RN about patient having worsening confusion and increased lethargy.  Patient has received one dose of morphine at 2128. RN already paged [facility] NP. When I arrived, patient was being given first dose of Narcan, patient did arouse to voice and once awake, she was able to tell her name, the year, and where she was.  Patient was able to follow commands in all extremities equally, sensation was intact as well. Her voice was very soft spoken but not slurred, no gaze, weak cough, facial symmetry was hard to assess due to poor effort from the patient, + tremors in all extremities, per RN, worse now.  + pulses, VSS, maintaining sats > 93 % om 4L [place]. + CT on the left side. Patient was experiencing urinary retention, I/O cath > 1000 cc.[facility] NP came to the bedside as well. + Eliquis.  Blood sugar 132. Patient is not in acute distress  Interventions: -- Narcan 0.4 x 3, mental status still fluctuating -- I/O cath  Plan of Care: [facility] NP gave signout to [facility] MD, MD is coming to evaluate to the patient  Event Summary:   at    Call Time [place] End Time [place], [place]

## 2018-03-06 NOTE — Progress Notes (Signed)
RN paged earlier in night that pt had awakened with mild confusion 2 hours after receiving pain medication. NP not concerned at that time due to non focal exam. About 0635 hrs, RN paged because pt was sleepy and not as talkative as earlier in the shift. Bladder scan revealed ovder 700cc. NP ordered Narcan and in and out cath and went to bedside. RRRN at bedside. Can not examine pt thoroughly as of yet due to cathing procedure. Pt is alert and c/o needing to urinate. Pt MOE x 4. Talking without slurring. Still lethargic mildly. 2nd dose of Narcan given and awaiting to examine pt when cathing procedure is done. 0700-reported off to attending, Dr. Wendee Beavers, NP's findings and actions. Dr. Wendee Beavers will come and do bedside exam and determine further tx plan.  KJKG, NP [facility]

## 2018-03-06 NOTE — Progress Notes (Addendum)
Pharmacy Antibiotic Note  Brooke Conley is a 84 y.o. female admitted on 02/28/2018 with a fall.  Pharmacy has been consulted for Vancomycin and Cefepime dosing for fever of unknown origin. Note her SCr is elevated but close to her baseline.  She hasn't had a creatinine checked since 3/31 and appears to have had some change in her clinical condition since that time.  Plan: Vancomycin 500mg  IV q48h Cefepime 1g IV Q24h Check BMET today to assess renal function BMET with AM labs Follow available micro data  Addendum 03/06/2018, 2:27 PM Lab Results  Component Value Date   CREATININE 1.76 (H) 03/06/2018   CREATININE 1.59 (H) 03/04/2018   CREATININE 1.70 (H) 02/14/2018    Today's SCr is stable from her prior values. No adjustment needed to the antibiotic regimen ordered.  Height: 5\' 3"  (160 cm) Weight: 102 lb 8 oz (46.5 kg) IBW/kg (Calculated) : 52.4  Temp (24hrs), Avg:99.8 F (37.7 C), Min:98.6 F (37 C), Max:102.2 F (39 C)  Recent Labs  Lab 03/04/2018 2041 03/04/18 1041  WBC 10.7* 7.8  CREATININE 1.70* 1.59*    Estimated Creatinine Clearance: 18 mL/min (A) (by C-G formula based on SCr of 1.59 mg/dL (H)).    Allergies  Allergen Reactions  . Penicillins Rash    Antimicrobials this admission: Vanc 4/2>> Cefepime 4/2>>  Microbiology results: 4/2 BCx:  3/31 MRSA PCR: neg  Thank you for allowing pharmacy to be a part of this patient's care.  Norva Riffle 03/06/2018 12:41 PM

## 2018-03-06 NOTE — Progress Notes (Signed)
RN went to give pt BP medication and pt was less responsive and more lethargic than previously in the shift. Pt able to tell me her name, the year, and that she was in [place]. VSS. Pt moving all extremities and following commands. Bladder scan showed >700 urine. In/out cath provided 1000 cc of clear yellow urine. Rapid response was called. On-call provider notified and came to bedside. Pt received 3 doses of narcan with some relief, pt waxing and waning with responsiveness. Awaiting new orders from attending. Will continue to monitor.  Clint Bolder, RN 03/06/18 7:57 AM

## 2018-03-06 NOTE — Progress Notes (Signed)
Verbal order received from Dr. Wendee Beavers for CT head w/out contrast.

## 2018-03-06 NOTE — Progress Notes (Signed)
Trauma Service Note  Subjective: Nurses are concerned that the patient is more confused and now more somnolent than most of yesterday.  To me she is just as flat affect wise as she was yesterday when I saw her.  No acute distress.  She has received Narcan.  No focal motor deficits.  Objective: Vital signs in last 24 hours: Temp:  [98.6 F (37 C)-99.3 F (37.4 C)] 99.3 F (37.4 C) (04/02 0539) Pulse Rate:  [78] 78 (04/01 0852) Resp:  [20] 20 (04/01 1618) BP: (129-210)/(57-98) 210/98 (04/02 0539) SpO2:  [92 %-100 %] 92 % (04/02 0539) Last BM Date: 03/01/18  Intake/Output from previous day: 04/01 0701 - 04/02 0700 In: 60 [P.O.:60] Out: 0  Intake/Output this shift: No intake/output data recorded.  General: No distress, but will not verbalize.  She has been started on her Eliquis  Lungs: Clear bilaterally.  No wheezes.  CXR shows that the left chest tube has almost been pulled out.  Smal left apical PTX  Abd: Benign  Extremities: No changes  Neuro: Not verbalizing.    Lab Results: CBC  Recent Labs    02/28/2018 2041 03/04/18 1041  WBC 10.7* 7.8  HGB 12.2 12.5  HCT 37.1 37.8  PLT 146* 152   BMET Recent Labs    03/01/2018 2041 03/04/18 1041  NA 136 135  K 4.1 3.5  CL 99* 99*  CO2 24 23  GLUCOSE 161* 116*  BUN 26* 23*  CREATININE 1.70* 1.59*  CALCIUM 9.2 8.8*   PT/INR No results for input(s): LABPROT, INR in the last 72 hours. ABG No results for input(s): PHART, HCO3 in the last 72 hours.  Invalid input(s): PCO2, PO2  Studies/Results: Dg Chest Port 1 View  Result Date: 03/06/2018 CLINICAL DATA:  Recent pneumothorax EXAM: PORTABLE CHEST 1 VIEW COMPARISON:  March 04, 2018 FINDINGS: Chest tube remains on the left, positioned laterally with several side holes outside of the left pleural space. There is a persistent small left apical pneumothorax without tension component. There is apical pleural thickening bilaterally, stable. There is no edema or consolidation.  Heart is upper normal in size with pulmonary vascularity within normal limits. There is aortic atherosclerosis. There are calcified breast implants bilaterally. IMPRESSION: Left chest tube is laterally position with several sideholes lateral to the left pleural space. Small left apical pneumothorax persists without tension component. No edema or consolidation. Bilateral apical pleural thickening is stable. Stable cardiac silhouette.  There is aortic atherosclerosis. Aortic Atherosclerosis (ICD10-I70.0). Electronically Signed   By: Lowella Grip III M.D.   On: 03/06/2018 07:13   Dg Chest Port 1 View  Result Date: 03/05/2018 CLINICAL DATA:  Left-sided pneumothorax.  Left chest tube in place. EXAM: PORTABLE CHEST 1 VIEW COMPARISON:  02/04/2018 FINDINGS: Unchanged position of left-sided chest tube projecting over the posterior left seventh rib. The previously noted tiny left apical pneumothorax is not apparent on current exam. Biapical crescentic opacities are stable possibly representing fluid at the apice versus pleuroparenchymal thickening. Stable cardiomegaly with aortic atherosclerosis. Displaced left-sided rib fractures involving the posterior fourth and fifth ribs are redemonstrated. The left lateral third rib fracture seen on CT is obscured by rib overlap. Small amount of subcutaneous emphysema from chest tube insertion. Calcified bilateral breast implants. IMPRESSION: 1. Left-sided chest tube remains in place. No progression of left-sided pneumothorax. The previous tiny left apical pneumothorax is not radiographically apparent. 2. Redemonstration acute displaced left fourth and fifth posterior rib fractures. Electronically Signed   By: Ashley Royalty  M.D.   On: 03/05/2018 03:37    Anti-infectives: Anti-infectives (From admission, onward)   None      Assessment/Plan: s/p  Remove left chest tube.  Even though she has a small left apical PTX, the fact that the CT has been pulled out from the  thoracic cavity almost makes it not useful, and if she should require another chest tube we would have to place a new one.  Will pull it out on PleuroVac suction.  She needs a stat head CT which I will order.  LOS: 3 days   Kathryne Eriksson. Dahlia Bailiff, MD, FACS [phone] Trauma Surgeon 03/06/2018

## 2018-03-06 NOTE — Progress Notes (Signed)
PT Cancellation Note  Patient Details Name: Brooke Conley MRN: [id number] DOB: 23-Nov-1927   Cancelled Treatment:    Reason Eval/Treat Not Completed: Medical issues which prohibited therapy(Pt rapid response earlier today. Nurse asked to [place]. )   Denice Paradise 03/06/2018, 10:13 AM  [facility] [phone] (pager)

## 2018-03-06 NOTE — Progress Notes (Signed)
Pt requested and received pain medication at HS for left shoulder pain. Pt woke up around 0000 feeling disoriented and confused, called RN to room. Pt states she woke up and thought the room was a mess. Pt states she was a little confused when she woke up but knows that she is still in the hospital. Pt requesting to see the doctor or supervisor about symptoms. RN reminded pt that some pain medications can cause some disorientation and confusion as side effects. Pt still feeling anxious about symptoms that have resolved. Charge nurse in to talk to pt and reassure her that some disorientation is not unheard of and is a relatively common side effect. RN paged on-call provider and made them aware of pts concerns. Pt in NAD at this time. Will continue to monitor.  Clint Bolder, RN 03/06/18

## 2018-03-06 NOTE — Progress Notes (Signed)
OT Cancellation Note  Patient Details Name: Brooke Conley MRN: [id number] DOB: 1927/01/20   Cancelled Treatment:    Reason Eval/Treat Not Completed: Other (comment);Medical issues which prohibited therapy. Rapid response called this am. Troponins slightly elevated. Nsg asked to HOLD today. Will attempt tomorrow if appropriate.  [place], OT/L  [phone] 03/06/2018 03/06/2018, 11:43 AM

## 2018-03-07 DIAGNOSIS — T17908A Unspecified foreign body in respiratory tract, part unspecified causing other injury, initial encounter: Secondary | ICD-10-CM

## 2018-03-07 LAB — BASIC METABOLIC PANEL
ANION GAP: 14 (ref 5–15)
BUN: 37 mg/dL — ABNORMAL HIGH (ref 6–20)
CALCIUM: 8.9 mg/dL (ref 8.9–10.3)
CHLORIDE: 97 mmol/L — AB (ref 101–111)
CO2: 25 mmol/L (ref 22–32)
CREATININE: 1.58 mg/dL — AB (ref 0.44–1.00)
GFR calc non Af Amer: 28 mL/min — ABNORMAL LOW (ref >60)
GFR, EST AFRICAN AMERICAN: 33 mL/min — AB (ref >60)
Glucose, Bld: 138 mg/dL — ABNORMAL HIGH (ref 65–99)
Potassium: 3.8 mmol/L (ref 3.5–5.1)
SODIUM: 136 mmol/L (ref 135–145)

## 2018-03-07 LAB — T3, FREE: T3, Free: 1.5 pg/mL — ABNORMAL LOW (ref 2.0–4.4)

## 2018-03-07 LAB — PROCALCITONIN: PROCALCITONIN: 5.47 ng/mL

## 2018-03-07 LAB — T4, FREE: FREE T4: 0.85 ng/dL (ref 0.61–1.12)

## 2018-03-07 MED ORDER — DEXTROSE-NACL 5-0.9 % IV SOLN
INTRAVENOUS | Status: DC
  Administered 2018-03-07 – 2018-03-09 (×5): via INTRAVENOUS

## 2018-03-07 NOTE — Progress Notes (Addendum)
While on 2W, RN and RT informed that patient has required more oxygen overnight, per RN, patient is on 8L [place], sats have been in the mid 70s prior to increase in oxygen. Per RN, patient has been lethargic, patient does awake voice.  Lung sounds - rhonchi throughout, per RN, patient appears to be aspirating. Not in acute respiratory distress. [facility] NP came to the bedside as well  Plan:  -- Aspiration Precautions  -- Pulmonary Toileting as patient tolerates -- RN to follow up with primary service and RRT if needed.   Start Time 0030 End Time [phone]

## 2018-03-07 NOTE — Care Management Important Message (Signed)
Important Message  Patient Details  Name: Brooke Conley MRN: [id number] Date of Birth: 1927-04-17   Medicare Important Message Given:  No  Due to illness patient not able to sign/Unsigned copy left   Brooke Conley 03/07/2018, 2:07 PM

## 2018-03-07 NOTE — Progress Notes (Signed)
PROGRESS NOTE    Brooke Conley  MVH:[id number] DOB: Dec 08, 1926 DOA: 02/08/2018 PCP: [name], MD   Brief Narrative: Brooke Conley is a 84 y.o. female with a history of breast cancer, hypertension, hyperlipidemia. She presented with syncope with subsequent rib/clavicular fractures and pneumothorax.   Assessment & Plan:   Principal Problem:   Pneumothorax Active Problems:   Hypertension   Hypothyroid   Syncope   CKD (chronic kidney disease) stage 3, GFR 30-59 ml/min (HCC)   Aspiration into airway   Left apical pneumothorax Left distal clavicular fracture Multiple left rib fractures (3-5) Stable. -Trauma recommendations: outpatient orthopedic/trauma follow-up in 2-3 weeks -Sling for left arm  Fever Patient started empirically on antibiotics. No infectious source. Blood cultures obtained. Urine culture obtained after antibiotics. Afebrile today. -Discontinue Vancomycin -Continue Cefepime -Blood cultures pending  Acute respiratory distress Acute respiratory failure with hypoxia Event overnight. Concern for aspiration. Possible source for recent fever. -SLP evaluation -Wean oxygen as able  Acute metabolic encephalopathy Thought secondary to medication effect vs infection. Patient with elevated TSH and low free T3. Unsure if patient is actually taking Synthroid as prescribed. Improving. -if does not improve, will obtain MRI  Essential hypertension Hypertensive -Continue metoprolol -Continue hydralazine prn  Hypothyroidism Unsure if patient is adherent with regimen. TSH elevated -Continue Synthroid for now until confirmation of medications  Paroxysmal atrial fibrillation Stable -Continue metoprolol, Eliquis  History of breast cancer Osteoporosis -Continue Tamoxifen  Lung nodules Outpatient follow-up with repeat non-contrast chest CT in 3-6 months  Acute urinary retention ?infection. Patient started on antibiotics on 4/2 so unsure. -If continues  to need, I/O, will need foley   DVT prophylaxis: Eliquis Code Status:   Code Status: Full Code Family Communication: None at bedside Disposition Plan: Discharge likely to SNF when medically stable, off of oxygen, improvement of mental status   Consultants:   Trauma  Orthopedic surgery  Procedures:   None  Antimicrobials:  Vancomycin  Cefepime    Subjective: Respiratory distress overnight.  Objective: Vitals:   03/07/18 0000 03/07/18 0200 03/07/18 0349 03/07/18 0834  BP: (!) 151/63 (!) 155/67 (!) 141/66 (!) 166/72  Pulse:   84 89  Resp:   20 (!) 24  Temp:   99.7 F (37.6 C) 98.8 F (37.1 C)  TempSrc:   Oral Oral  SpO2: 90% 95% 96% 95%  Weight:      Height:        Intake/Output Summary (Last 24 hours) at 03/07/2018 1024 Last data filed at 03/07/2018 0000 Gross per 24 hour  Intake 200 ml  Output 600 ml  Net -400 ml   Filed Weights   02/19/2018 1905 03/04/18 1848  Weight: 47.6 kg (105 lb) 46.5 kg (102 lb 8 oz)    Examination:  General exam: Appears calm and comfortable Respiratory system: end expiratory sound on left, no wheezing. Slightly diminished throughout on anterior auscultation. Tenderness over left chest. Cardiovascular system: S1 & S2 heard, RRR. No murmurs, rubs, gallops or clicks. Gastrointestinal system: Abdomen is nondistended, soft and nontender. No organomegaly or masses felt. Normal bowel sounds heard. Central nervous system: Alert. Does not appear to have focal deficit on examination. Cannot assess LUE. Extremities: No edema. No calf tenderness Skin: No cyanosis. No rashes Psychiatry: Patient not speaking on exam. She answers questions appropriately, but will not speak to me. When asking if she can speak, she looks at me with a puzzled look on her face.    Data Reviewed: I have personally reviewed following  labs and imaging studies  CBC: Recent Labs  Lab 02/10/2018 2041 03/04/18 1041  WBC 10.7* 7.8  HGB 12.2 12.5  HCT 37.1 37.8  MCV  94.9 94.5  PLT 146* 983   Basic Metabolic Panel: Recent Labs  Lab 02/05/2018 2041 03/02/2018 2145 03/04/18 1041 03/06/18 1253 03/07/18 0335  NA 136  --  135 132* 136  K 4.1  --  3.5 3.9 3.8  CL 99*  --  99* 99* 97*  CO2 24  --  23 24 25   GLUCOSE 161*  --  116* 201* 138*  BUN 26*  --  23* 36* 37*  CREATININE 1.70*  --  1.59* 1.76* 1.58*  CALCIUM 9.2  --  8.8* 8.8* 8.9  MG  --  2.0  --   --   --    GFR: Estimated Creatinine Clearance: 18.1 mL/min (A) (by C-G formula based on SCr of 1.58 mg/dL (H)). Liver Function Tests: Recent Labs  Lab 03/04/18 1041  AST 31  ALT 23  ALKPHOS 63  BILITOT 1.0  PROT 6.7  ALBUMIN 3.4*   No results for input(s): LIPASE, AMYLASE in the last 168 hours. No results for input(s): AMMONIA in the last 168 hours. Coagulation Profile: No results for input(s): INR, PROTIME in the last 168 hours. Cardiac Enzymes: Recent Labs  Lab 03/04/18 1041 03/04/18 1910 03/05/18 0213  CKTOTAL 149  --   --   CKMB 2.2  --   --   TROPONINI 0.03* 0.03* 0.04*   BNP (last 3 results) No results for input(s): PROBNP in the last 8760 hours. HbA1C: No results for input(s): HGBA1C in the last 72 hours. CBG: Recent Labs  Lab 02/09/2018 2016 03/06/18 0645  GLUCAP 178* 132*   Lipid Profile: No results for input(s): CHOL, HDL, LDLCALC, TRIG, CHOLHDL, LDLDIRECT in the last 72 hours. Thyroid Function Tests: Recent Labs    03/04/18 1041 03/06/18 1036  TSH 41.947*  --   T3FREE  --  1.5*   Anemia Panel: No results for input(s): VITAMINB12, FOLATE, FERRITIN, TIBC, IRON, RETICCTPCT in the last 72 hours. Sepsis Labs: No results for input(s): PROCALCITON, LATICACIDVEN in the last 168 hours.  Recent Results (from the past 240 hour(s))  MRSA PCR Screening     Status: None   Collection Time: 03/04/18  6:59 PM  Result Value Ref Range Status   MRSA by PCR NEGATIVE NEGATIVE Final    Comment:        The GeneXpert MRSA Assay (FDA approved for NASAL specimens only), is  one component of a comprehensive MRSA colonization surveillance program. It is not intended to diagnose MRSA infection nor to guide or monitor treatment for MRSA infections. Performed at [facility], [place] [address], [place], [place] [address]          Radiology Studies: Ct Head Wo Contrast  Result Date: 03/06/2018 CLINICAL DATA:  Focal neuro deficit, stroke suspected. Altered level of consciousness. EXAM: CT HEAD WITHOUT CONTRAST TECHNIQUE: Contiguous axial images were obtained from the base of the skull through the vertex without intravenous contrast. COMPARISON:  CT head 02/27/2018 FINDINGS: Brain: Mild atrophy stable. Negative for hydrocephalus. Chronic microvascular ischemic change in the white matter. Small chronic infarct right cerebellum unchanged. Negative for acute infarct, hemorrhage, mass. Vascular: Negative for hyperdense vessel Skull: Negative Sinuses/Orbits: Negative sinuses.  Bilateral cataract replacement Other: None IMPRESSION: No acute intracranial abnormality. Mild chronic microvascular ischemic changes stable from the prior study. Electronically Signed   By: Juanda Crumble  Carlis Abbott M.D.   On: 03/06/2018 08:13   Dg Chest Port 1 View  Result Date: 03/07/2018 CLINICAL DATA:  Aspiration short of breath EXAM: PORTABLE CHEST 1 VIEW COMPARISON:  03/06/2018, 03/04/2018, 02/02/2018 FINDINGS: Calcified breast implants. Development of bibasilar airspace disease right greater than left. Stable borderline to mild cardiomegaly with aortic atherosclerosis. Biapical pleural and parenchymal thickening. Small residual left apical pneumothorax without significant change. Left upper rib fractures are again demonstrated. IMPRESSION: 1. Interim development of bibasilar airspace disease which may reflect aspiration or pneumonia. 2. Stable tiny left apical pneumothorax. Electronically Signed   By: Donavan Foil M.D.   On: 03/07/2018 00:23   Dg Chest Port 1 View  Result Date: 03/06/2018 CLINICAL  DATA:  Follow-up left-sided pneumothorax. Status post left chest tube removal. EXAM: PORTABLE CHEST 1 VIEW COMPARISON:  Portable chest x-ray of March 06, 2018 FINDINGS: There is a persistent small left apical pneumothorax amounting to approximately 5% of the lung volume. On the right no pneumothorax is evident. There is no pleural effusion. There is biapical pleural thickening or scarring. The heart is top-normal in size. The pulmonary vascularity is normal. There is calcification in the wall of the thoracic aorta. There are bilateral breast implants exhibiting capsular calcification. There are deformities of the lateral aspects of the left fourth and fifth ribs which appears stable. IMPRESSION: There is an approximately 5% left apical pneumothorax which appears smaller than on the previous study. The left chest tube is been removed. There is no significant pleural effusion nor other acute cardiopulmonary abnormality. Underlying COPD. Chronic biapical pleural thickening. Displaced fractures of the left fourth and fifth ribs laterally. Electronically Signed   By: David  Martinique M.D.   On: 03/06/2018 14:00   Dg Chest Port 1 View  Result Date: 03/06/2018 CLINICAL DATA:  Recent pneumothorax EXAM: PORTABLE CHEST 1 VIEW COMPARISON:  March 04, 2018 FINDINGS: Chest tube remains on the left, positioned laterally with several side holes outside of the left pleural space. There is a persistent small left apical pneumothorax without tension component. There is apical pleural thickening bilaterally, stable. There is no edema or consolidation. Heart is upper normal in size with pulmonary vascularity within normal limits. There is aortic atherosclerosis. There are calcified breast implants bilaterally. IMPRESSION: Left chest tube is laterally position with several sideholes lateral to the left pleural space. Small left apical pneumothorax persists without tension component. No edema or consolidation. Bilateral apical pleural  thickening is stable. Stable cardiac silhouette.  There is aortic atherosclerosis. Aortic Atherosclerosis (ICD10-I70.0). Electronically Signed   By: Lowella Grip III M.D.   On: 03/06/2018 07:13        Scheduled Meds: . amiodarone  200 mg Oral BID  . apixaban  2.5 mg Oral BID  . levothyroxine  25 mcg Oral QAC breakfast  . mouth rinse  15 mL Mouth Rinse BID  . metoprolol succinate  50 mg Oral Daily  . tamoxifen  20 mg Oral Daily   Continuous Infusions: . ceFEPime (MAXIPIME) IV Stopped (03/06/18 1250)  . vancomycin Stopped (03/06/18 1308)     LOS: 4 days     Cordelia Poche, MD [facility] 03/07/2018, 10:24 AM Pager: [phone]  If 7PM-7AM, please contact night-coverage [email or web address] Password [facility] 03/07/2018, 10:24 AM

## 2018-03-07 NOTE — Evaluation (Signed)
Clinical/Bedside Swallow Evaluation Patient Details  Name: Brooke Conley MRN: [id number] Date of Birth: 1927/10/16  Today's Date: 03/07/2018 Time: SLP Start Time (ACUTE ONLY): 1001 SLP Stop Time (ACUTE ONLY): 1021 SLP Time Calculation (min) (ACUTE ONLY): 20 min  Past Medical History:  Past Medical History:  Diagnosis Date  . History of breast cancer   . History of uterine cancer 1982  . Hyperlipidemia   . Hypertension   . Osteoporosis   . Prediabetes   . Vitamin D deficiency    Past Surgical History:  Past Surgical History:  Procedure Laterality Date  . BREAST SURGERY Right 2007   lumpectomy  . CATARACT EXTRACTION Right 2010  . COSMETIC SURGERY Bilateral 1979   Breast  . TONSILLECTOMY    . TOTAL ABDOMINAL HYSTERECTOMY W/ BILATERAL SALPINGOOPHORECTOMY     HPI:  Pt is an 84 y.o. female with h/o breast/uterine cancer, hypertension, hyperlipidemia who presents with c/o fall and ? syncope wtih PTX, L distal clavicular fx, and L rib fx (3-5). Hospitalization was complicated by AMS, fever, and suspected aspiration event. CT Head 4/2 was negative but CXR 4/3 showed development of bibasilar airspace disease which may reflect aspiration or PNA.   Assessment / Plan / Recommendation Clinical Impression  Pt has generalized oral weakness and significantly reduced breath support for phonation and coughing, suspect at least in part due to guarding from rib fx. She is drowsy but will follow commands given extra time and Min-Mod cues. She is minimally conversant and her voice is primarily aphonic, although she does produce very weak phonation with Mod cues. She has multiple swallows per bolus regardless of consistency and tires quickly, but with no overt signs of aspiration. Given the above, particularly with concern for reduced ability to clear potential aspirates, would start conservatively when resuming POs. For today would try meds crushed in puree or a few ice chips after oral care only when pt  is maximally alert. SLP will f/u for improved alertness and endurance for potential to participate in MBS.  SLP Visit Diagnosis: Dysphagia, unspecified (R13.10)    Aspiration Risk  Moderate aspiration risk    Diet Recommendation NPO except meds;Ice chips PRN after oral care   Medication Administration: Crushed with puree    Other  Recommendations Oral Care Recommendations: Oral care QID;Oral care prior to ice chip/H20   Follow up Recommendations (tba)      Frequency and Duration min 2x/week  2 weeks       Prognosis Prognosis for Safe Diet Advancement: Fair Barriers to Reach Goals: Other (Comment)(overall deconditioning, ability to cough)      Swallow Study   General HPI: Pt is an 84 y.o. female with h/o breast/uterine cancer, hypertension, hyperlipidemia who presents with c/o fall and ? syncope wtih PTX, L distal clavicular fx, and L rib fx (3-5). Hospitalization was complicated by AMS, fever, and suspected aspiration event. CT Head 4/2 was negative but CXR 4/3 showed development of bibasilar airspace disease which may reflect aspiration or PNA. Type of Study: Bedside Swallow Evaluation Previous Swallow Assessment: none in chart Diet Prior to this Study: NPO Temperature Spikes Noted: Yes(102.2) Respiratory Status: Nasal cannula History of Recent Intubation: No Behavior/Cognition: Lethargic/Drowsy;Cooperative;Requires cueing Oral Cavity Assessment: Within Functional Limits Oral Care Completed by SLP: No Oral Cavity - Dentition: Adequate natural dentition Self-Feeding Abilities: Total assist Patient Positioning: Upright in bed Baseline Vocal Quality: Low vocal intensity;Other (comment)(aphonic unless cued) Volitional Cough: Weak Volitional Swallow: Able to elicit    Oral/Motor/Sensory Function Overall  Oral Motor/Sensory Function: Generalized oral weakness   Ice Chips Ice chips: Impaired Presentation: Spoon Pharyngeal Phase Impairments: Multiple swallows   Thin Liquid Thin  Liquid: Impaired Presentation: Spoon;Straw Pharyngeal  Phase Impairments: Multiple swallows    Nectar Thick Nectar Thick Liquid: Not tested   Honey Thick Honey Thick Liquid: Not tested   Puree Puree: Impaired Presentation: Spoon Oral Phase Functional Implications: Prolonged oral transit Pharyngeal Phase Impairments: Multiple swallows   Solid   GO   Solid: Not tested        Germain Osgood 03/07/2018,11:04 AM  Germain Osgood, M.A. CCC-SLP [phone]

## 2018-03-07 NOTE — Evaluation (Signed)
Occupational Therapy Evaluation Patient Details Name: Brooke Conley MRN: [id number] DOB: 05-02-1927 Today's Date: 03/07/2018    History of Present Illness 84 y.o. female admitted for syncope and fall with complaints of left shoulder and left-sided rib pain. Pt found to have left rib 3-5 fx, left clavicle fx, and pneumothorax. PMH significant for CKD, a-fib, syncope, prediabetes, breast CA, HTN, and HLD.   Clinical Impression   Per pt's son, pt lives alone and is independent in mobility, self care and IADL,. Son gets pt's groceries. Pt has baseline deficits in memory according to her son. She communicated verbally very little this session and needed maximum prompting to follow commands. Pt required 2 person assist for stand-pivot transfer. She is dependent in ADL. Pt will need post acute rehab in SNF. Will follow acutely.    Follow Up Recommendations  SNF;Supervision/Assistance - 24 hour    Equipment Recommendations       Recommendations for Other Services       Precautions / Restrictions Precautions Precautions: Fall;Shoulder Type of Shoulder Precautions: L clavicle fx Shoulder Interventions: Shoulder sling/immobilizer;For comfort Restrictions Weight Bearing Restrictions: Yes LUE Weight Bearing: Non weight bearing      Mobility Bed Mobility Overal bed mobility: Needs Assistance Bed Mobility: Supine to Sit;Sit to Supine     Supine to sit: Mod assist Sit to supine: Mod assist   General bed mobility comments: step by step cues for sequencing, assisted LEs in/out of bed, assisted to raise trunk, +2 total assist to pull up and position pt upright in bed  Transfers Overall transfer level: Needs assistance Equipment used: 2 person hand held assist Transfers: Sit to/from Stand;Stand Pivot Transfers Sit to Stand: +2 physical assistance;Mod assist Stand pivot transfers: +2 physical assistance;Mod assist       General transfer comment: assist to rise and steady as she took  pivotal steps bed<>BSC    Balance Overall balance assessment: Needs assistance   Sitting balance-Leahy Scale: Fair Sitting balance - Comments: statically     Standing balance-Leahy Scale: Poor Standing balance comment: flexed posture, assist of two to steady                           ADL either performed or assessed with clinical judgement   ADL                                         General ADL Comments: pt is NPO, requires total assist for ADL     Vision Baseline Vision/History: No visual deficits Additional Comments: per son, but has some perceptual issues ie: sees pillow case as a bug, but acuity is typically good     Perception     Praxis      Pertinent Vitals/Pain Pain Assessment: Faces Faces Pain Scale: Hurts even more Pain Location: R ribs/shoulder Pain Descriptors / Indicators: Guarding;Grimacing Pain Intervention(s): Monitored during session;Repositioned     Hand Dominance Right   Extremity/Trunk Assessment Upper Extremity Assessment Upper Extremity Assessment: RUE deficits/detail;LUE deficits/detail RUE Deficits / Details: moderate tremor, generalized weakness RUE Coordination: decreased fine motor LUE Deficits / Details: generalized weakness, limited shoulder movement due to clavicle fx LUE: Unable to fully assess due to immobilization LUE Coordination: decreased gross motor   Lower Extremity Assessment Lower Extremity Assessment: Defer to PT evaluation       Communication Communication Communication: [facility]  Cognition Arousal/Alertness: Awake/alert Behavior During Therapy: Flat affect Overall Cognitive Status: Impaired/Different from baseline Area of Impairment: Attention;Following commands;Safety/judgement;Problem solving;Memory                   Current Attention Level: Focused Memory: Decreased short-term memory(per son pt with baseline memory deficits) Following Commands: Follows one step commands with  increased time;Follows one step commands inconsistently(with multimodal, step by step cues) Safety/Judgement: Decreased awareness of safety;Decreased awareness of deficits   Problem Solving: Slow processing;Decreased initiation;Difficulty sequencing;Requires verbal cues;Requires tactile cues General Comments: pt minimally verbal, stated only that she needed to use the bathroom   General Comments       Exercises     Shoulder Instructions      Home Living Family/patient expects to be discharged to:: Skilled nursing facility Living Arrangements: Alone                                      Prior Functioning/Environment Level of Independence: Independent        Comments: son got her groceries, pt walked without a device, performed self care, meal prep and housekeeping per son        OT Problem List: Decreased strength;Decreased activity tolerance;Impaired balance (sitting and/or standing);Decreased coordination;Decreased cognition;Decreased safety awareness;Decreased knowledge of use of DME or AE;Decreased knowledge of precautions;Impaired UE functional use;Pain;Impaired tone      OT Treatment/Interventions: Self-care/ADL training;DME and/or AE instruction;Therapeutic activities;Cognitive remediation/compensation;Patient/family education;Balance training    OT Goals(Current goals can be found in the care plan section) Acute Rehab OT Goals Patient Stated Goal: son is agreeable for pt to go to snf OT Goal Formulation: With family Time For Goal Achievement: 03/21/18 Potential to Achieve Goals: Fair  OT Frequency: Min 2X/week   Barriers to D/C: Decreased caregiver support          Co-evaluation PT/OT/SLP Co-Evaluation/Treatment: Yes Reason for Co-Treatment: For patient/therapist safety   OT goals addressed during session: ADL's and self-care      AM-PAC PT "6 Clicks" Daily Activity     Outcome Measure Help from another person eating meals?: Total Help from  another person taking care of personal grooming?: Total Help from another person toileting, which includes using toliet, bedpan, or urinal?: Total Help from another person bathing (including washing, rinsing, drying)?: Total Help from another person to put on and taking off regular upper body clothing?: Total Help from another person to put on and taking off regular lower body clothing?: Total 6 Click Score: 6   End of Session Equipment Utilized During Treatment: Gait belt;Oxygen Nurse Communication: Mobility status  Activity Tolerance: Treatment limited secondary to medical complications (Comment) Patient left: in bed;with call bell/phone within reach;with bed alarm set  OT Visit Diagnosis: Unsteadiness on feet (R26.81);Pain;BPPV;History of falling (Z91.81);Muscle weakness (generalized) (M62.81) Pain - part of body: Shoulder                Time: 4098-1191 OT Time Calculation (min): 22 min Charges:  OT General Charges $OT Visit: 1 Visit OT Evaluation $OT Eval Moderate Complexity: 1 Mod G-Codes:    04-04-18 Brooke Conley, OTR/L Pager: [phone]  Brooke Conley, Haze Boyden 04-Apr-2018, 12:03 PM

## 2018-03-07 NOTE — Evaluation (Signed)
Physical Therapy Evaluation Patient Details Name: Brooke Conley MRN: [id number] DOB: 05/22/1927 Today's Date: 03/07/2018   History of Present Illness  84 y.o. female admitted for syncope and fall with complaints of left shoulder and left-sided rib pain. Pt found to have left rib 3-5 fx, left clavicle fx, and pneumothorax. PMH significant for CKD, a-fib, syncope, prediabetes, breast CA, HTN, and HLD.  Clinical Impression  Pt presents with overall decrease in functional mobility secondary to above including impairments listed below (see PT Problem List). Pt requires mod assist for bed mobility and mod assist +2 for transfer bed<>BSC. She requires max multimodal cues and facilitation to follow commands. Pt alert with very minimal verbal interaction this session.  According to pt's son, she lives alone and is independent with all mobility without an AD and has memory deficits at baseline. Recommend SNF level PT to maximize safety, functional mobility, and independence. Will continue to follow acutely and progress as tolerated.     Follow Up Recommendations SNF;Supervision/Assistance - 24 hour    Equipment Recommendations  Other (comment)(defer to next venue of care)    Recommendations for Other Services       Precautions / Restrictions Precautions Precautions: Fall;Shoulder Type of Shoulder Precautions: L clavicle fx Shoulder Interventions: Shoulder sling/immobilizer;For comfort Restrictions Weight Bearing Restrictions: Yes LUE Weight Bearing: Non weight bearing      Mobility  Bed Mobility Overal bed mobility: Needs Assistance Bed Mobility: Supine to Sit;Sit to Supine     Supine to sit: Mod assist Sit to supine: Mod assist   General bed mobility comments: step by step cues for sequencing, assisted LEs in/out of bed, assisted to raise trunk, +2 total assist to pull up and position pt upright in bed  Transfers Overall transfer level: Needs assistance Equipment used: 2 person hand  held assist Transfers: Sit to/from Stand;Stand Pivot Transfers Sit to Stand: +2 physical assistance;Mod assist Stand pivot transfers: +2 physical assistance;Mod assist       General transfer comment: assist to rise and steady as she took pivotal steps bed<>BSC  Ambulation/Gait             General Gait Details: unable to perform this session  Stairs            Wheelchair Mobility    Modified Rankin (Stroke Patients Only)       Balance Overall balance assessment: Needs assistance   Sitting balance-Leahy Scale: Fair Sitting balance - Comments: statically     Standing balance-Leahy Scale: Poor Standing balance comment: flexed posture, assist of two to steady                             Pertinent Vitals/Pain Pain Assessment: Faces Faces Pain Scale: Hurts even more Pain Location: R ribs/shoulder Pain Descriptors / Indicators: Guarding;Grimacing Pain Intervention(s): Monitored during session;Repositioned    Home Living Family/patient expects to be discharged to:: Skilled nursing facility Living Arrangements: Alone                    Prior Function Level of Independence: Independent         Comments: son got her groceries, pt walked without a device, performed self care, meal prep and housekeeping per son     Hand Dominance   Dominant Hand: Right    Extremity/Trunk Assessment   Upper Extremity Assessment Upper Extremity Assessment: Defer to OT evaluation RUE Deficits / Details: moderate tremor, generalized weakness RUE Coordination: decreased  fine motor LUE Deficits / Details: generalized weakness, limited shoulder movement due to clavicle fx LUE: Unable to fully assess due to immobilization LUE Coordination: decreased gross motor    Lower Extremity Assessment Lower Extremity Assessment: Generalized weakness       Communication   Communication: [facility]  Cognition Arousal/Alertness: Awake/alert Behavior During Therapy: Flat  affect Overall Cognitive Status: Impaired/Different from baseline Area of Impairment: Attention;Following commands;Safety/judgement;Problem solving;Memory                   Current Attention Level: Focused Memory: Decreased short-term memory(per son pt with baseline memory deficits) Following Commands: Follows one step commands with increased time;Follows one step commands inconsistently(with multimodal, step by step cues) Safety/Judgement: Decreased awareness of safety;Decreased awareness of deficits   Problem Solving: Slow processing;Decreased initiation;Difficulty sequencing;Requires verbal cues;Requires tactile cues General Comments: pt minimally verbal, stated only that she needed to use the bathroom      General Comments General comments (skin integrity, edema, etc.): SpO2 83-88% on 8-10L O2    Exercises     Assessment/Plan    PT Assessment Patient needs continued PT services  PT Problem List Decreased strength;Decreased range of motion;Decreased activity tolerance;Decreased balance;Decreased mobility;Decreased cognition;Decreased safety awareness;Decreased knowledge of precautions;Cardiopulmonary status limiting activity;Pain       PT Treatment Interventions DME instruction;Gait training;Stair training;Functional mobility training;Therapeutic activities;Therapeutic exercise;Balance training;Cognitive remediation;Neuromuscular re-education;Patient/family education    PT Goals (Current goals can be found in the Care Plan section)  Acute Rehab PT Goals Patient Stated Goal: son is agreeable for pt to go to snf PT Goal Formulation: With family Time For Goal Achievement: 03/21/18 Potential to Achieve Goals: Fair    Frequency Min 3X/week   Barriers to discharge Decreased caregiver support pt lives alone    Co-evaluation PT/OT/SLP Co-Evaluation/Treatment: Yes Reason for Co-Treatment: For patient/therapist safety PT goals addressed during session: Mobility/safety  with mobility;Balance;Strengthening/ROM OT goals addressed during session: ADL's and self-care       AM-PAC PT "6 Clicks" Daily Activity  Outcome Measure Difficulty turning over in bed (including adjusting bedclothes, sheets and blankets)?: Unable Difficulty moving from lying on back to sitting on the side of the bed? : Unable Difficulty sitting down on and standing up from a chair with arms (e.g., wheelchair, bedside commode, etc,.)?: Unable Help needed moving to and from a bed to chair (including a wheelchair)?: Total Help needed walking in hospital room?: Total Help needed climbing 3-5 steps with a railing? : Total 6 Click Score: 6    End of Session Equipment Utilized During Treatment: Oxygen Activity Tolerance: Treatment limited secondary to medical complications (Comment);Other (comment)(limited by low oxygen saturation and altered cognition) Patient left: in bed;with call bell/phone within reach;with bed alarm set;with nursing/sitter in room(bil heels floated) Nurse Communication: Mobility status;Other (comment)(decreased oxygen saturation) PT Visit Diagnosis: Unsteadiness on feet (R26.81);Other abnormalities of gait and mobility (R26.89);Muscle weakness (generalized) (M62.81);History of falling (Z91.81);Pain Pain - Right/Left: Left Pain - part of body: Shoulder;Arm(ribs)    Time: 4627-0350 PT Time Calculation (min) (ACUTE ONLY): 22 min   Charges:   PT Evaluation $PT Eval Moderate Complexity: 1 Mod     PT G Codes:        Vic Ripper, SPT   Vic Ripper 03/07/2018, 12:46 PM

## 2018-03-07 NOTE — Progress Notes (Signed)
[facility]/Trauma Progress Note      Assessment/Plan Principal Problem:   Pneumothorax Active Problems:   Hypertension   Hypothyroid   Syncope   CKD (chronic kidney disease) stage 3, GFR 30-59 ml/min (HCC)  Fall L 3-5 rib fractures L sided PTX - chest xray 03/31 showed resolution of PTX - chest xray 04/03 showed bibasilar airspace disease, stable tiny left apical PTX - CT removed 04/02 - pt pulling <250 on IS L clavicle fracture - nonweight bearing and non-op per ortho - sling for comfort and ROM as tolerated  FEN: NPO pending speech eval VTE: SCD's, home Eliquis ID: none Follow up: [facility] 2-3 weeks, trauma clinic 2 weeks after discharge  DISPO: PT/OT. Encourage IS and ambulation. Concerns for aspiration overnight. Stable tiny left PTX. Continue IS and pulmonary toileting and pain control. Asked nurse to bring pt another IS.     LOS: 4 days    Subjective: CC: fall  Pt states no pain this am. She states she is doing okay. She is alert and responsive although her answers are short. She does not remember the events overnight.   Objective: Vital signs in last 24 hours: Temp:  [98.5 F (36.9 C)-102.2 F (39 C)] 98.8 F (37.1 C) (04/03 0834) Pulse Rate:  [70-95] 89 (04/03 0834) Resp:  [18-24] 24 (04/03 0834) BP: (114-193)/(50-89) 166/72 (04/03 0834) SpO2:  [90 %-99 %] 95 % (04/03 0834) Last BM Date: 03/01/18  Intake/Output from previous day: 04/02 0701 - 04/03 0700 In: 1100 [P.O.:900; IV Piggyback:200] Out: 600 [Urine:600] Intake/Output this shift: No intake/output data recorded.  PE: Gen:  Alert, NAD, pleasant, cooperative, thin frail elderly woman Card:  RRR,  Pulm:  CTA anteriorly with diminished breath sounds b/l bases, no W/R/R appreciated, rate and effort normal, [place] in place, Skin: no rashes noted, warm and dry  Anti-infectives: Anti-infectives (From admission, onward)   Start     Dose/Rate Route Frequency Ordered Stop   03/06/18  1200  vancomycin (VANCOCIN) 500 mg in sodium chloride 0.9 % 100 mL IVPB     500 mg 100 mL/hr over 60 Minutes Intravenous Every 48 hours 03/06/18 1007     03/06/18 1030  ceFEPIme (MAXIPIME) 1 g in sodium chloride 0.9 % 100 mL IVPB     1 g 200 mL/hr over 30 Minutes Intravenous Every 24 hours 03/06/18 1006        Lab Results:  Recent Labs    03/04/18 1041  WBC 7.8  HGB 12.5  HCT 37.8  PLT 152   BMET Recent Labs    03/06/18 1253 03/07/18 0335  NA 132* 136  K 3.9 3.8  CL 99* 97*  CO2 24 25  GLUCOSE 201* 138*  BUN 36* 37*  CREATININE 1.76* 1.58*  CALCIUM 8.8* 8.9   PT/INR No results for input(s): LABPROT, INR in the last 72 hours. CMP     Component Value Date/Time   NA 136 03/07/2018 0335   K 3.8 03/07/2018 0335   CL 97 (L) 03/07/2018 0335   CO2 25 03/07/2018 0335   GLUCOSE 138 (H) 03/07/2018 0335   BUN 37 (H) 03/07/2018 0335   CREATININE 1.58 (H) 03/07/2018 0335   CREATININE 1.40 (H) 08/31/2017 1744   CALCIUM 8.9 03/07/2018 0335   PROT 6.7 03/04/2018 1041   ALBUMIN 3.4 (L) 03/04/2018 1041   AST 31 03/04/2018 1041   ALT 23 03/04/2018 1041   ALKPHOS 63 03/04/2018 1041   BILITOT 1.0 03/04/2018 1041   GFRNONAA 28 (  L) 03/07/2018 0335   GFRNONAA 33 (L) 08/31/2017 1744   GFRAA 33 (L) 03/07/2018 0335   GFRAA 39 (L) 08/31/2017 1744   Lipase  No results found for: LIPASE  Studies/Results: Ct Head Wo Contrast  Result Date: 03/06/2018 CLINICAL DATA:  Focal neuro deficit, stroke suspected. Altered level of consciousness. EXAM: CT HEAD WITHOUT CONTRAST TECHNIQUE: Contiguous axial images were obtained from the base of the skull through the vertex without intravenous contrast. COMPARISON:  CT head 02/06/2018 FINDINGS: Brain: Mild atrophy stable. Negative for hydrocephalus. Chronic microvascular ischemic change in the white matter. Small chronic infarct right cerebellum unchanged. Negative for acute infarct, hemorrhage, mass. Vascular: Negative for hyperdense vessel Skull:  Negative Sinuses/Orbits: Negative sinuses.  Bilateral cataract replacement Other: None IMPRESSION: No acute intracranial abnormality. Mild chronic microvascular ischemic changes stable from the prior study. Electronically Signed   By: Franchot Gallo M.D.   On: 03/06/2018 08:13   Dg Chest Port 1 View  Result Date: 03/07/2018 CLINICAL DATA:  Aspiration short of breath EXAM: PORTABLE CHEST 1 VIEW COMPARISON:  03/06/2018, 03/04/2018, 02/19/2018 FINDINGS: Calcified breast implants. Development of bibasilar airspace disease right greater than left. Stable borderline to mild cardiomegaly with aortic atherosclerosis. Biapical pleural and parenchymal thickening. Small residual left apical pneumothorax without significant change. Left upper rib fractures are again demonstrated. IMPRESSION: 1. Interim development of bibasilar airspace disease which may reflect aspiration or pneumonia. 2. Stable tiny left apical pneumothorax. Electronically Signed   By: Donavan Foil M.D.   On: 03/07/2018 00:23   Dg Chest Port 1 View  Result Date: 03/06/2018 CLINICAL DATA:  Follow-up left-sided pneumothorax. Status post left chest tube removal. EXAM: PORTABLE CHEST 1 VIEW COMPARISON:  Portable chest x-ray of March 06, 2018 FINDINGS: There is a persistent small left apical pneumothorax amounting to approximately 5% of the lung volume. On the right no pneumothorax is evident. There is no pleural effusion. There is biapical pleural thickening or scarring. The heart is top-normal in size. The pulmonary vascularity is normal. There is calcification in the wall of the thoracic aorta. There are bilateral breast implants exhibiting capsular calcification. There are deformities of the lateral aspects of the left fourth and fifth ribs which appears stable. IMPRESSION: There is an approximately 5% left apical pneumothorax which appears smaller than on the previous study. The left chest tube is been removed. There is no significant pleural effusion  nor other acute cardiopulmonary abnormality. Underlying COPD. Chronic biapical pleural thickening. Displaced fractures of the left fourth and fifth ribs laterally. Electronically Signed   By: David  Martinique M.D.   On: 03/06/2018 14:00   Dg Chest Port 1 View  Result Date: 03/06/2018 CLINICAL DATA:  Recent pneumothorax EXAM: PORTABLE CHEST 1 VIEW COMPARISON:  March 04, 2018 FINDINGS: Chest tube remains on the left, positioned laterally with several side holes outside of the left pleural space. There is a persistent small left apical pneumothorax without tension component. There is apical pleural thickening bilaterally, stable. There is no edema or consolidation. Heart is upper normal in size with pulmonary vascularity within normal limits. There is aortic atherosclerosis. There are calcified breast implants bilaterally. IMPRESSION: Left chest tube is laterally position with several sideholes lateral to the left pleural space. Small left apical pneumothorax persists without tension component. No edema or consolidation. Bilateral apical pleural thickening is stable. Stable cardiac silhouette.  There is aortic atherosclerosis. Aortic Atherosclerosis (ICD10-I70.0). Electronically Signed   By: Lowella Grip III M.D.   On: 03/06/2018 07:13  Kalman Drape , [facility] 03/07/2018, 8:37 AM  Pager: [phone] Mon-Wed, Friday 7:00am-4:30pm Thurs 7am-11:30am

## 2018-03-07 NOTE — Discharge Instructions (Signed)

## 2018-03-07 NOTE — Progress Notes (Signed)
Patient had requested I come back on Wednesday to do AD.  Patient was not communicating very much-seemed very difficult.  She seemed anxious/nervous as I spoke about the AD. I felt she was not wanting to do it at the time so I had prayer with her and shared I could come another day to consider if she wants to do the AD.  I felt I was making her uncomfortable talking about the AD. Conard Novak, Chaplain   03/07/18 1100  Clinical Encounter Type  Visited With Patient  Visit Type Follow-up  Referral From Chaplain  Consult/Referral To Chaplain  Spiritual Encounters  Spiritual Needs Prayer;Other (Comment) (had planned to do Advance Directive)

## 2018-03-07 NOTE — Progress Notes (Signed)
RN paged because pt appeared to aspirate taking meds in applesauce. Her O2 sat dropped to the 70s. RT in and pt placed on 8L high flow O2 and O2 sat up to 91-94%. NP at bedside. Lungs are congested, especially on the right upper lobe. Pt has pain with coughing but was able to cough up some sputum. Can NTS her if needed, but now she is comfortable appearing on the high flow O2 and is in no respiratory distress. Pt is allergic to PCN, so can not change med to Zosyn. On Cefepime and Vanc. Other VS are normal limits for her. No spike in fever tonight. Pt made NPO and order for SLP eval placed. Wean O2 as possible given hx of COPD. Plan discussed with RN and RT. CXR pending.  KJKG, NP [facility]  Total critical care time: 30 minutes Critical care time was exclusive of separately billable procedures and treating other patients. Critical care was necessary to treat or prevent imminent or life-threatening deterioration. Critical care was time spent personally by me on the following activities: development of treatment plan with patient and/or surrogate as well as nursing, discussions with consultants, evaluation of patient's response to treatment, examination of patient, obtaining history from patient or surrogate, ordering and performing treatments and interventions, ordering and review of laboratory studies, ordering and review of radiographic studies, pulse oximetry and re-evaluation of patient's condition.

## 2018-03-07 NOTE — Progress Notes (Addendum)
While giving patient her medications crushed in applesauce patient took second spoonful, began to cough said that she was choking. Oxygen saturation dropped into 70's-88's Patient tried to cough, this RN and RT suctioned patient and placed on non rebreather with minimal improvement.    RRT,RT and TRH NP,Kirby at bedside

## 2018-03-08 ENCOUNTER — Inpatient Hospital Stay (HOSPITAL_COMMUNITY): Payer: Medicare Other

## 2018-03-08 DIAGNOSIS — J939 Pneumothorax, unspecified: Secondary | ICD-10-CM

## 2018-03-08 DIAGNOSIS — E039 Hypothyroidism, unspecified: Secondary | ICD-10-CM

## 2018-03-08 DIAGNOSIS — S2242XA Multiple fractures of ribs, left side, initial encounter for closed fracture: Secondary | ICD-10-CM

## 2018-03-08 DIAGNOSIS — S42002A Fracture of unspecified part of left clavicle, initial encounter for closed fracture: Secondary | ICD-10-CM

## 2018-03-08 DIAGNOSIS — I10 Essential (primary) hypertension: Secondary | ICD-10-CM

## 2018-03-08 LAB — APTT: APTT: 88 s — AB (ref 24–36)

## 2018-03-08 LAB — HEPARIN LEVEL (UNFRACTIONATED)

## 2018-03-08 LAB — AMMONIA: AMMONIA: 24 umol/L (ref 9–35)

## 2018-03-08 MED ORDER — MORPHINE SULFATE (PF) 2 MG/ML IV SOLN
2.0000 mg | INTRAVENOUS | Status: DC | PRN
Start: 2018-03-08 — End: 2018-03-12

## 2018-03-08 MED ORDER — AMIODARONE PEDIATRIC ORAL SUSPENSION 5 MG/ML
200.0000 mg | Freq: Two times a day (BID) | ORAL | Status: DC
  Filled 2018-03-08 (×2): qty 40

## 2018-03-08 MED ORDER — IPRATROPIUM-ALBUTEROL 0.5-2.5 (3) MG/3ML IN SOLN
3.0000 mL | Freq: Three times a day (TID) | RESPIRATORY_TRACT | Status: DC
  Administered 2018-03-08 – 2018-03-12 (×10): 3 mL via RESPIRATORY_TRACT
  Filled 2018-03-08 (×11): qty 3

## 2018-03-08 MED ORDER — METOPROLOL TARTRATE 5 MG/5ML IV SOLN
2.5000 mg | Freq: Four times a day (QID) | INTRAVENOUS | Status: DC
  Administered 2018-03-08 – 2018-03-09 (×6): 2.5 mg via INTRAVENOUS
  Filled 2018-03-08 (×6): qty 5

## 2018-03-08 MED ORDER — AMIODARONE HCL 200 MG PO TABS
200.0000 mg | ORAL_TABLET | Freq: Two times a day (BID) | ORAL | Status: DC
  Administered 2018-03-08 – 2018-03-12 (×8): 200 mg via ORAL
  Filled 2018-03-08 (×8): qty 1

## 2018-03-08 MED ORDER — HEPARIN (PORCINE) IN NACL 100-0.45 UNIT/ML-% IJ SOLN
700.0000 [IU]/h | INTRAMUSCULAR | Status: DC
  Administered 2018-03-08: 750 [IU]/h via INTRAVENOUS
  Filled 2018-03-08: qty 250

## 2018-03-08 NOTE — Progress Notes (Signed)
Physical Therapy Treatment Patient Details Name: Brooke Conley MRN: [id number] DOB: Feb 12, 1927 Today's Date: 03/08/2018    History of Present Illness Pt is an 84 y.o. female admitted for syncope and fall with complaints of left shoulder and left-sided rib pain. Pt found to have left rib 3-5 fx, left clavicle fx, and pneumothorax. PMH significant for CKD, a-fib, syncope, prediabetes, breast CA, HTN, and HLD.    PT Comments    Pt continues to require physical assistance of two for functional mobility. Pt not resisting movement, but not participating in assisting with any movement. Pt also very slow with responses, if any response was given at all. Pt only shaking head "no" in response to therapist's questions this session. Pt would continue to benefit from skilled physical therapy services at this time while admitted and after d/c to address the below listed limitations in order to improve overall safety and independence with functional mobility.    Follow Up Recommendations  SNF;Supervision/Assistance - 24 hour     Equipment Recommendations  Other (comment)(defer to next venue)    Recommendations for Other Services       Precautions / Restrictions Precautions Precautions: Fall;Shoulder Type of Shoulder Precautions: L clavicle fx Shoulder Interventions: Shoulder sling/immobilizer;For comfort Restrictions Weight Bearing Restrictions: Yes LUE Weight Bearing: Non weight bearing    Mobility  Bed Mobility Overal bed mobility: Needs Assistance Bed Mobility: Supine to Sit;Sit to Supine     Supine to sit: Mod assist Sit to supine: Mod assist;+2 for physical assistance   General bed mobility comments: increased time, multimodal cueing, assist with trunk elevation and for bilateral LEs to return to bed  Transfers Overall transfer level: Needs assistance Equipment used: 2 person hand held assist Transfers: Sit to/from Stand Sit to Stand: Max assist;+2 physical assistance          General transfer comment: pt unable to achieve full erect standing position even with max A x2 and use of bed pads; pt not resisting but not participating either  Ambulation/Gait             General Gait Details: unable at time of session   Stairs            Wheelchair Mobility    Modified Rankin (Stroke Patients Only)       Balance Overall balance assessment: Needs assistance Sitting-balance support: Feet supported;Single extremity supported Sitting balance-Leahy Scale: Poor Sitting balance - Comments: fluctuated between requiring min A to mod A with posterior lean and R lateral lean     Standing balance-Leahy Scale: Zero Standing balance comment: max A x2; pt unable to achieve full erect standing position                            Cognition Arousal/Alertness: Awake/alert Behavior During Therapy: Flat affect Overall Cognitive Status: Impaired/Different from baseline Area of Impairment: Following commands;Safety/judgement;Problem solving;Memory                     Memory: Decreased short-term memory Following Commands: Follows one step commands inconsistently Safety/Judgement: Decreased awareness of safety;Decreased awareness of deficits   Problem Solving: Slow processing;Decreased initiation;Difficulty sequencing;Requires verbal cues;Requires tactile cues General Comments: pt with only shaking head "no" intermittently when asked questions      Exercises      General Comments        Pertinent Vitals/Pain Pain Assessment: Faces Faces Pain Scale: Hurts even more Pain Location: L shoulder Pain Descriptors /  Indicators: Guarding;Grimacing Pain Intervention(s): Monitored during session;Repositioned    Home Living                      Prior Function            PT Goals (current goals can now be found in the care plan section) Acute Rehab PT Goals PT Goal Formulation: With family Time For Goal Achievement:  03/21/18 Potential to Achieve Goals: Fair Progress towards PT goals: Progressing toward goals    Frequency    Min 2X/week      PT Plan Frequency needs to be updated    Co-evaluation              AM-PAC PT "6 Clicks" Daily Activity  Outcome Measure  Difficulty turning over in bed (including adjusting bedclothes, sheets and blankets)?: Unable Difficulty moving from lying on back to sitting on the side of the bed? : Unable Difficulty sitting down on and standing up from a chair with arms (e.g., wheelchair, bedside commode, etc,.)?: Unable Help needed moving to and from a bed to chair (including a wheelchair)?: Total Help needed walking in hospital room?: Total Help needed climbing 3-5 steps with a railing? : Total 6 Click Score: 6    End of Session Equipment Utilized During Treatment: Oxygen Activity Tolerance: Patient limited by fatigue Patient left: in bed;with call bell/phone within reach;with bed alarm set Nurse Communication: Mobility status PT Visit Diagnosis: Unsteadiness on feet (R26.81);Other abnormalities of gait and mobility (R26.89);Muscle weakness (generalized) (M62.81);History of falling (Z91.81);Pain Pain - Right/Left: Left Pain - part of body: Shoulder;Arm     Time: 0165-5374 PT Time Calculation (min) (ACUTE ONLY): 31 min  Charges:  $Therapeutic Activity: 23-37 mins                    G Codes:       [place], PT, [place] 03/08/2018, 12:10 PM

## 2018-03-08 NOTE — Progress Notes (Signed)
PROGRESS NOTE    Lumina Gitto Spiering  KDX:[id number] DOB: October 11, 1927 DOA: 02/21/2018 PCP: [name], MD   Brief Narrative: Prescious Hurless Trimm is a 84 y.o. female with a history of breast cancer, hypertension, hyperlipidemia. She presented with syncope with subsequent rib/clavicular fractures and pneumothorax.   Assessment & Plan:   Principal Problem:   Pneumothorax Active Problems:   Hypertension   Hypothyroid   Syncope   CKD (chronic kidney disease) stage 3, GFR 30-59 ml/min (HCC)   Aspiration into airway   Left apical pneumothorax Left distal clavicular fracture Multiple left rib fractures (3-5) Stable. -Trauma recommendations: outpatient orthopedic/trauma follow-up in 2-3 weeks -Sling for left arm  Fever Patient started empirically on antibiotics. No infectious source. Blood cultures obtained. Urine culture obtained after antibiotics. Afebrile today. -Continue Cefepime -Blood cultures pending  Acute respiratory distress Acute respiratory failure with hypoxia Event overnight. Concern for aspiration. Possible source for recent fever. -SLP evaluation -Wean oxygen as able -Chest x-ray  Acute metabolic encephalopathy Thought secondary to medication effect vs infection. Patient with elevated TSH and low free T3 with normal T4. Unsure if patient is actually taking Synthroid as prescribed. Improving. -MRI brain -ammonia level -EEG  Essential hypertension Hypertensive -Continue metoprolol -Continue hydralazine prn  Hypothyroidism Unsure if patient is adherent with regimen. TSH elevated. Free T4 normal and free T3 low -Continue Synthroid for now until confirmation of medications  Paroxysmal atrial fibrillation Stable -Continue metoprolol, Eliquis  History of breast cancer Osteoporosis -Continue Tamoxifen  Lung nodules Outpatient follow-up with repeat non-contrast chest CT in 3-6 months  Acute urinary retention ?infection. Patient started on antibiotics on  4/2 so unsure. -If continues to need, I/O, will need foley   DVT prophylaxis: Eliquis Code Status:   Code Status: Full Code Family Communication: Son on telephone. Disposition Plan: Discharge likely to SNF when medically stable, off of oxygen, improvement of mental status   Consultants:   Trauma  Orthopedic surgery  Procedures:   None  Antimicrobials:  Vancomycin  Cefepime    Subjective: Patient continues to be less interactive. Does not speak but answers questions appropriately.  Objective: Vitals:   03/08/18 0814 03/08/18 0954 03/08/18 1100 03/08/18 1224  BP: (!) 166/72 (!) 167/63 (!) 158/128 138/64  Pulse: 94     Resp: (!) 22   (!) 22  Temp: 98.3 F (36.8 C)   98.3 F (36.8 C)  TempSrc: Oral   Axillary  SpO2: 97%  91% 95%  Weight:      Height:        Intake/Output Summary (Last 24 hours) at 03/08/2018 1337 Last data filed at 03/08/2018 0600 Gross per 24 hour  Intake 667.5 ml  Output 500 ml  Net 167.5 ml   Filed Weights   03/01/2018 1905 03/04/18 1848 03/08/18 0446  Weight: 47.6 kg (105 lb) 46.5 kg (102 lb 8 oz) 51.1 kg (112 lb 10.5 oz)    Examination:  General exam: Appears calm and comfortable Respiratory system: Diminished bilaterally. Mild accessory muscle usage. Cardiovascular system: Regular rate and rhythm. Normal S1 and S2. No heart murmurs present. No extra heart sounds Gastrointestinal system: Soft, non-tender, non-distended, no guarding, no rebound, no masses felt. Normal sounds. Central nervous system: Alert Extremities: No edema. No calf tenderness Skin: No cyanosis. No rashes Psychiatry: Non-verbal but responds appropriately to questions with nods/shakes of her head    Data Reviewed: I have personally reviewed following labs and imaging studies  CBC: Recent Labs  Lab 02/25/2018 2041 03/04/18 1041  WBC  10.7* 7.8  HGB 12.2 12.5  HCT 37.1 37.8  MCV 94.9 94.5  PLT 146* 295   Basic Metabolic Panel: Recent Labs  Lab 02/13/2018 2041  02/15/2018 2145 03/04/18 1041 03/06/18 1253 03/07/18 0335  NA 136  --  135 132* 136  K 4.1  --  3.5 3.9 3.8  CL 99*  --  99* 99* 97*  CO2 24  --  23 24 25   GLUCOSE 161*  --  116* 201* 138*  BUN 26*  --  23* 36* 37*  CREATININE 1.70*  --  1.59* 1.76* 1.58*  CALCIUM 9.2  --  8.8* 8.8* 8.9  MG  --  2.0  --   --   --    GFR: Estimated Creatinine Clearance: 19.9 mL/min (A) (by C-G formula based on SCr of 1.58 mg/dL (H)). Liver Function Tests: Recent Labs  Lab 03/04/18 1041  AST 31  ALT 23  ALKPHOS 63  BILITOT 1.0  PROT 6.7  ALBUMIN 3.4*   No results for input(s): LIPASE, AMYLASE in the last 168 hours. No results for input(s): AMMONIA in the last 168 hours. Coagulation Profile: No results for input(s): INR, PROTIME in the last 168 hours. Cardiac Enzymes: Recent Labs  Lab 03/04/18 1041 03/04/18 1910 03/05/18 0213  CKTOTAL 149  --   --   CKMB 2.2  --   --   TROPONINI 0.03* 0.03* 0.04*   BNP (last 3 results) No results for input(s): PROBNP in the last 8760 hours. HbA1C: No results for input(s): HGBA1C in the last 72 hours. CBG: Recent Labs  Lab 02/22/2018 2016 03/06/18 0645  GLUCAP 178* 132*   Lipid Profile: No results for input(s): CHOL, HDL, LDLCALC, TRIG, CHOLHDL, LDLDIRECT in the last 72 hours. Thyroid Function Tests: Recent Labs    03/06/18 1036 03/07/18 1404  FREET4  --  0.85  T3FREE 1.5*  --    Anemia Panel: No results for input(s): VITAMINB12, FOLATE, FERRITIN, TIBC, IRON, RETICCTPCT in the last 72 hours. Sepsis Labs: Recent Labs  Lab 03/07/18 0920  PROCALCITON 5.47    Recent Results (from the past 240 hour(s))  MRSA PCR Screening     Status: None   Collection Time: 03/04/18  6:59 PM  Result Value Ref Range Status   MRSA by PCR NEGATIVE NEGATIVE Final    Comment:        The GeneXpert MRSA Assay (FDA approved for NASAL specimens only), is one component of a comprehensive MRSA colonization surveillance program. It is not intended to diagnose  MRSA infection nor to guide or monitor treatment for MRSA infections. Performed at [facility], [place] [address] [place], [place] [address]   Culture, blood (Routine X 2) w Reflex to ID Panel     Status: None (Preliminary result)   Collection Time: 03/06/18 10:50 AM  Result Value Ref Range Status   Specimen Description BLOOD RIGHT ANTECUBITAL  Final   Special Requests IN PEDIATRIC BOTTLE Blood Culture adequate volume  Final   Culture   Final    NO GROWTH 2 DAYS Performed at [facility], [place] [address], [place], [place] [address]    Report Status PENDING  Incomplete  Culture, blood (Routine X 2) w Reflex to ID Panel     Status: None (Preliminary result)   Collection Time: 03/06/18 10:55 AM  Result Value Ref Range Status   Specimen Description BLOOD LEFT ANTECUBITAL  Final   Special Requests   Final    BOTTLES  DRAWN AEROBIC ONLY Blood Culture adequate volume   Culture   Final    NO GROWTH 2 DAYS Performed at [facility], [place] [address] [place],  [address]    Report Status PENDING  Incomplete         Radiology Studies: Dg Chest Port 1 View  Result Date: 03/08/2018 CLINICAL DATA:  Rhonchi EXAM: PORTABLE CHEST 1 VIEW COMPARISON:  March 06, 2018 FINDINGS: There is atelectatic change in the lung bases. There is apical pleural thickening bilaterally, stable. There is no edema or consolidation. Heart size and pulmonary vascularity are normal. No adenopathy. There are calcified breast implants bilaterally. No pneumothorax evident. There is evidence of a fracture of the lateral left clavicle as well as several rib fractures superiorly on the left. IMPRESSION: Currently no pneumothorax. Bibasilar atelectasis. No edema or consolidation. Stable cardiac silhouette. Apical pleural thickening noted bilaterally. Several superior rib fractures on the left as well as a fracture of the lateral left clavicle, mildly displaced. Electronically Signed   By: Lowella Grip III  M.D.   On: 03/08/2018 12:38   Dg Chest Port 1 View  Result Date: 03/07/2018 CLINICAL DATA:  Aspiration short of breath EXAM: PORTABLE CHEST 1 VIEW COMPARISON:  03/06/2018, 03/04/2018, 02/27/2018 FINDINGS: Calcified breast implants. Development of bibasilar airspace disease right greater than left. Stable borderline to mild cardiomegaly with aortic atherosclerosis. Biapical pleural and parenchymal thickening. Small residual left apical pneumothorax without significant change. Left upper rib fractures are again demonstrated. IMPRESSION: 1. Interim development of bibasilar airspace disease which may reflect aspiration or pneumonia. 2. Stable tiny left apical pneumothorax. Electronically Signed   By: Donavan Foil M.D.   On: 03/07/2018 00:23   Dg Chest Port 1 View  Result Date: 03/06/2018 CLINICAL DATA:  Follow-up left-sided pneumothorax. Status post left chest tube removal. EXAM: PORTABLE CHEST 1 VIEW COMPARISON:  Portable chest x-ray of March 06, 2018 FINDINGS: There is a persistent small left apical pneumothorax amounting to approximately 5% of the lung volume. On the right no pneumothorax is evident. There is no pleural effusion. There is biapical pleural thickening or scarring. The heart is top-normal in size. The pulmonary vascularity is normal. There is calcification in the wall of the thoracic aorta. There are bilateral breast implants exhibiting capsular calcification. There are deformities of the lateral aspects of the left fourth and fifth ribs which appears stable. IMPRESSION: There is an approximately 5% left apical pneumothorax which appears smaller than on the previous study. The left chest tube is been removed. There is no significant pleural effusion nor other acute cardiopulmonary abnormality. Underlying COPD. Chronic biapical pleural thickening. Displaced fractures of the left fourth and fifth ribs laterally. Electronically Signed   By: David  Martin[place] M.D.   On: 03/06/2018 14:00         Scheduled Meds: . amiodarone  200 mg Oral BID  . levothyroxine  25 mcg Oral QAC breakfast  . mouth rinse  15 mL Mouth Rinse BID  . metoprolol tartrate  2.5 mg Intravenous Q6H  . tamoxifen  20 mg Oral Daily   Continuous Infusions: . ceFEPime (MAXIPIME) IV Stopped (03/08/18 1011)  . dextrose 5 % and 0.9% NaCl 75 mL/hr at 03/08/18 0100  . heparin       LOS: 5 days     Cordelia Poche, MD [facility] 03/08/2018, 1:37 PM Pager: [phone]  If 7PM-7AM, please contact night-coverage [email or web address] Password [email or web address] 03/08/2018, 1:37 PM

## 2018-03-08 NOTE — Progress Notes (Signed)
  Speech Language Pathology Treatment: Dysphagia  Patient Details Name: Brooke Conley MRN: [id number] DOB: 11-Aug-1927 Today's Date: 03/08/2018 Time: 1157-2620 SLP Time Calculation (min) (ACUTE ONLY): 11 min  Assessment / Plan / Recommendation Clinical Impression  Pt presents with improved vocal quality, as indicated by ability to independently phonate throughout session. Pt consumed ice chips, thin liquid via cup, and puree with minimal signs concerning for aspiration (slight, immediate throat clear with initial sip of thins). However, pt quickly fatigued during treatment; therefore, pt would not likely be able to sustain energy for meal completion. Given continued concern for limited ability to protect airway due to weak cough, recommend starting slow with ice chips/sips of water following oral care and medications administered via puree. SLP will follow up to determine if pt is appropriate for further diet advancements and potential need for MBS to determine current oropharyngeal swallow function.    HPI HPI: Pt is an 84 y.o. female with h/o breast/uterine cancer, hypertension, hyperlipidemia who presents with c/o fall and ? syncope wtih PTX, L distal clavicular fx, and L rib fx (3-5). Hospitalization was complicated by AMS, fever, and suspected aspiration event. CT Head 4/2 was negative but CXR 4/3 showed development of bibasilar airspace disease which may reflect aspiration or PNA.      SLP Plan  Continue with current plan of care       Recommendations  Diet recommendations: NPO;Other(comment)(ice chips/sips after oral care) Medication Administration: Crushed with puree                Oral Care Recommendations: Oral care prior to ice chip/H20;Oral care QID Follow up Recommendations: Skilled Nursing facility SLP Visit Diagnosis: Dysphagia, unspecified (R13.10) Plan: Continue with current plan of care       GO              [place] Paisley Grajeda SLP Student Clinician   [place]  Tryston Gilliam 03/08/2018, 9:55 AM

## 2018-03-08 NOTE — Clinical Social Work Note (Signed)
Clinical Social Work Assessment  Patient Details  Name: Brooke Conley MRN: [id number] Date of Birth: 04-03-1927  Date of referral:  03/08/18               Reason for consult:  Facility Placement                Permission sought to share information with:  Chartered certified accountant granted to share information::  No(pt would'nt speak)  Name::     Nutritional therapist::     Relationship::  son  Contact Information:     Housing/Transportation Living arrangements for the past 2 months:  Single Family Home Source of Information:  Adult Children Patient Interpreter Needed:  None Criminal Activity/Legal Involvement Pertinent to Current Situation/Hospitalization:  No - Comment as needed Significant Relationships:  Adult Children Lives with:  Self Do you feel safe going back to the place where you live?  No Need for family participation in patient care:  Yes (Comment)(decision making at this time)  Care giving concerns:  Pt lives at apartment alone- pt son checks in 3-4 times a week and does all shopping for pt.  Pt normally able to complete ADLs independently per son.  Son does not think pt safe to return to apartment alone in current condition.   Social Worker assessment / plan:  CSW attempted to speak with pt concerning PT recommendation for SNF.  Started asking questions but pt was nonresponsive.  Was alert and sitting up in bed but would not answer questions verbally- appeared to try and nod head somewhat but was not clear with answers.  Pt with gown up and had her hand over her vagina- did not attempt to cover with presence of CSW and did not appear to notice what she was doing.  Due to pt questionable orientation CSW reached out to pt son.  Son reports that pt also had a daughter but she lives out of state.  Pt son lives locally and has been involved in her care.  States pt has been declining over past year.   Has been having short term memory problems which pt son  believes is a result of a mini stroke- states pt memory and strength have declined over past 1-2 months and pt has been using furniture to get around her home.  CSW discussed current recommendation for SNF at time of DC.  Explained SNF and SNF referral process.   Employment status:  Retired Nurse, adult PT Recommendations:  [place] / Referral to community resources:  [place]  Patient/Family's Response to care:  Pt son agreeable to SNF- realistic that pt would not be safe to be home in current condition and that there is not sufficient support to be with her 24 hours a day.  Patient/Family's Understanding of and Emotional Response to Diagnosis, Current Treatment, and Prognosis:  Pt son concerned about pt condition but hopeful for a recovery- realistic that her health has been declining recently.  Emotional Assessment Appearance:  Appears stated age Attitude/Demeanor/Rapport:    Affect (typically observed):  Inappropriate, Quiet Orientation:  Oriented to Self, Oriented to Place, Oriented to  Time, Oriented to Situation Alcohol / Substance use:  Not Applicable Psych involvement (Current and /or in the community):  No (Comment)  Discharge Needs  Concerns to be addressed:  Care Coordination Readmission within the last 30 days:  No Current discharge risk:  Lives alone, Physical Impairment, Cognitively Impaired Barriers to Discharge:  Continued Medical Work up   Jorge Ny, LCSW 03/08/2018, 3:06 PM

## 2018-03-08 NOTE — Consult Note (Signed)
   [facility] CM Inpatient Consult   03/08/2018  Brooke Conley 1927-03-03 [id number]  Chart reviewed as the patient was previously attempted with [facility] out reaches for EMMI follow up but was unsuccessful with attempts.  Chart reviewed that the patient admitted with syncope and now with multiple fractures and PT/OT are recommending skilled nursing placement as patient lives alone and requiring 2 person assistance, and recommending 24 hour supervision.  Patient in the [place]. Will follow for disposition and needs.  Came down to speak with patient she is currently in ICU/Stepdown and was sound asleep when I am rounding and did not disturb.    For questions, please contact:  Natividad Brood, RN BSN [facility] Liaison  [phone] business mobile phone Toll free office [phone]

## 2018-03-08 NOTE — NC FL2 (Signed)
[place] MEDICAID FL2 LEVEL OF CARE SCREENING TOOL     IDENTIFICATION  Patient Name: Brooke Conley Birthdate: Mar 23, 1927 Sex: female Admission Date (Current Location): 02/28/2018  [facility] and [place] Number:  Herbalist and Address:  [facility], [place] [address], [place], [place] [address]      Provider Number: [id number]  Attending Physician Name and Address:  Mariel Aloe, MD  Relative Name and Phone Number:       Current Level of Care: Hospital Recommended Level of Care: [place] Prior Approval Number:    Date Approved/Denied:   PASRR Number: [id number]  Discharge Plan: SNF    Current Diagnoses: Patient Active Problem List   Diagnosis Date Noted  . Aspiration into airway   . Pneumothorax 02/19/2018  . CKD (chronic kidney disease) stage 3, GFR 30-59 ml/min (HCC) 03/02/2018  . Atrial fibrillation with RVR ([place]) 09/20/2017  . Syncope 09/19/2017  . Encounter for general adult medical examination with abnormal findings 07/26/2016  . Hypothyroid 07/06/2015  . Medicare annual wellness visit, initial 07/06/2015  . Medication management 06/26/2014  . Prediabetes   . Hypertension   . Hyperlipidemia   . Osteoporosis   . History of breast cancer   . Vitamin D deficiency   . History of uterine cancer     Orientation RESPIRATION BLADDER Height & Weight     Self, Place, Time  O2(see DC summary) Incontinent, Indwelling catheter Weight: 112 lb 10.5 oz (51.1 kg) Height:  5\' 3"  (160 cm)  BEHAVIORAL SYMPTOMS/MOOD NEUROLOGICAL BOWEL NUTRITION STATUS      Continent Diet  AMBULATORY STATUS COMMUNICATION OF NEEDS Skin   Extensive Assist Verbally Normal                       Personal Care Assistance Level of Assistance  Bathing, Dressing Bathing Assistance: Maximum assistance   Dressing Assistance: Maximum assistance     Functional Limitations Info             SPECIAL CARE FACTORS FREQUENCY  PT (By  licensed PT), OT (By licensed OT)     PT Frequency: 5/wk OT Frequency: 5/wk            Contractures      Additional Factors Info  Code Status, Allergies Code Status Info: FULL Allergies Info: Penicillins           Current Medications (03/08/2018):  This is the current hospital active medication list Current Facility-Administered Medications  Medication Dose Route Frequency Provider Last Rate Last Dose  . acetaminophen (TYLENOL) suppository 325 mg  325 mg Rectal Q4H PRN Velvet Bathe, MD   325 mg at 03/07/18 1541  . acetaminophen (TYLENOL) tablet 650 mg  650 mg Oral Q6H PRN Velvet Bathe, MD      . albuterol (PROVENTIL) (2.5 MG/3ML) 0.083% nebulizer solution 2.5 mg  2.5 mg Nebulization Q4H PRN Gardiner Barefoot, NP   2.5 mg at 03/06/18 2350  . amiodarone (PACERONE) tablet 200 mg  200 mg Oral BID Jani Gravel, MD   Stopped at 03/07/18 1000  . ceFEPIme (MAXIPIME) 1 g in sodium chloride 0.9 % 100 mL IVPB  1 g Intravenous Q24H Norva Riffle, RPH   Stopped at 03/08/18 1011  . dextrose 5 %-0.9 % sodium chloride infusion   Intravenous Continuous Kirby-Graham, Karsten Fells, NP 75 mL/hr at 03/08/18 0100    . heparin ADULT infusion 100 units/mL (25000 units/229mL sodium chloride 0.45%)  750 Units/hr Intravenous Continuous Reginia Naas, RPH      . hydrALAZINE (APRESOLINE) injection 10 mg  10 mg Intravenous Q6H PRN Jani Gravel, MD   10 mg at 03/08/18 0954  . ibuprofen (ADVIL,MOTRIN) tablet 600 mg  600 mg Oral Q6H PRN Judeth Horn, MD      . levothyroxine (SYNTHROID, LEVOTHROID) tablet 25 mcg  25 mcg Oral QAC breakfast Jani Gravel, MD   25 mcg at 03/06/18 0920  . MEDLINE mouth rinse  15 mL Mouth Rinse BID Velvet Bathe, MD   15 mL at 03/08/18 1114  . methocarbamol (ROBAXIN) tablet 500 mg  500 mg Oral Q8H PRN Rolm Bookbinder, MD      . metoprolol tartrate (LOPRESSOR) injection 2.5 mg  2.5 mg Intravenous Q6H Mariel Aloe, MD      . naloxone [facility]) injection 0.4 mg  0.4 mg  Intravenous PRN Gardiner Barefoot, NP   0.4 mg at 03/06/18 0932  . oxyCODONE (Oxy IR/ROXICODONE) immediate release tablet 2.5 mg  2.5 mg Oral Q4H PRN Judeth Horn, MD      . tamoxifen (NOLVADEX) tablet 20 mg  20 mg Oral Daily Jani Gravel, MD   Stopped at 03/07/18 1000     Discharge Medications: Please see discharge summary for a list of discharge medications.  Relevant Imaging Results:  Relevant Lab Results:   Additional Information SS#: [id number]  Jorge Ny, LCSW

## 2018-03-08 NOTE — Progress Notes (Signed)
EEG Complete. Results pending. 

## 2018-03-08 NOTE — Procedures (Signed)
History: 84 year old female with encephalopathy  Sedation: None  Technique: This is a 21 channel routine scalp EEG performed at the bedside with bipolar and monopolar montages arranged in accordance to the international 10/20 system of electrode placement. One channel was dedicated to EKG recording.    Background: The background consists of intermixed alpha and beta activities. There is a well defined posterior dominant rhythm of 10 - 11 Hz that attenuates with eye opening. Sleep is recorded with normal appearing structures.  Positive occipital sharp transients of sleep (normal variant) were seen.  Photic stimulation: Physiologic driving is not performed  EEG Abnormalities: None  Clinical Interpretation: This normal EEG is recorded in the waking and sleep state. There was no seizure or seizure predisposition recorded on this study. Please note that a normal EEG does not preclude the possibility of epilepsy.   Roland Rack, MD [facility] [phone]  If 7pm- 7am, please page neurology on call as listed in [place].

## 2018-03-08 NOTE — Progress Notes (Signed)
ANTICOAGULATION CONSULT NOTE - Initial Consult  Pharmacy Consult for heparin Indication: atrial fibrillation  Allergies  Allergen Reactions  . Penicillins Rash    Patient Measurements: Height: 5\' 3"  (160 cm) Weight: 112 lb 10.5 oz (51.1 kg) IBW/kg (Calculated) : 52.4  Vital Signs: Temp: 98.3 F (36.8 C) (04/04 1224) Temp Source: Axillary (04/04 1224) BP: 138/64 (04/04 1224) Pulse Rate: 94 (04/04 0814)  Labs: Recent Labs    03/06/18 1253 03/07/18 0335  CREATININE 1.76* 1.58*    Estimated Creatinine Clearance: 19.9 mL/min (A) (by C-G formula based on SCr of 1.58 mg/dL (H)).   Medical History: Past Medical History:  Diagnosis Date  . History of breast cancer   . History of uterine cancer 1982  . Hyperlipidemia   . Hypertension   . Osteoporosis   . Prediabetes   . Vitamin D deficiency    Assessment: 84 yo on Eliquis PTA for Afib. Has not received since 4/2. Now to start heparin as bridge while patient NPO. Hgb and plts ok.   Goal of Therapy:  Heparin level 0.3-0.7 units/ml Monitor platelets by anticoagulation protocol: Yes   Plan:  Start heparin gtt 750 units/hr Monitor daily heparin level, CBC, s/s of bleed F/U restart of Eliquis when able   Elenor Quinones, PharmD, [facility] Clinical Pharmacist Pager [phone] 03/08/2018 12:33 PM

## 2018-03-08 NOTE — Progress Notes (Signed)
ANTICOAGULATION CONSULT NOTE  Pharmacy Consult for heparin Indication: atrial fibrillation  Patient Measurements: Height: 5\' 3"  (160 cm) Weight: 112 lb 10.5 oz (51.1 kg) IBW/kg (Calculated) : 52.4  Vital Signs: Temp: 97.7 F (36.5 C) (04/04 2005) Temp Source: Oral (04/04 2005) BP: 171/77 (04/04 2005) Pulse Rate: 83 (04/04 2005)  Labs: Recent Labs    03/06/18 1253 03/07/18 0335 03/08/18 2158  HEPARINUNFRC  --   --  >2.20*  CREATININE 1.76* 1.58*  --     Medical History: Past Medical History:  Diagnosis Date  . History of breast cancer   . History of uterine cancer 1982  . Hyperlipidemia   . Hypertension   . Osteoporosis   . Prediabetes   . Vitamin D deficiency     Assessment: 84 yo on Eliquis PTA for Afib. Has not received since 4/2. Now to start heparin as bridge while patient NPO. Hgb and plts ok.   Heparin level is supratherapeutic, likely due to Eliquis. APTT is wnl. Will use aPTTs to titrate heparin until levels correlate.  Goal of Therapy:  Heparin level 0.3-0.7 units/ml Monitor platelets by anticoagulation protocol: Yes    Plan:  Continue heparin gtt at 750 units/hr Monitor daily heparin level, aPTT, CBC    Hughes Better, PharmD, BCPS Clinical Pharmacist 03/08/2018 11:35 PM

## 2018-03-09 ENCOUNTER — Inpatient Hospital Stay (HOSPITAL_COMMUNITY): Payer: Medicare Other

## 2018-03-09 LAB — CBC
HEMATOCRIT: 36.4 % (ref 36.0–46.0)
Hemoglobin: 11.4 g/dL — ABNORMAL LOW (ref 12.0–15.0)
MCH: 30.2 pg (ref 26.0–34.0)
MCHC: 31.3 g/dL (ref 30.0–36.0)
MCV: 96.3 fL (ref 78.0–100.0)
Platelets: 131 10*3/uL — ABNORMAL LOW (ref 150–400)
RBC: 3.78 MIL/uL — ABNORMAL LOW (ref 3.87–5.11)
RDW: 15.2 % (ref 11.5–15.5)
WBC: 7.7 10*3/uL (ref 4.0–10.5)

## 2018-03-09 LAB — APTT: aPTT: 100 seconds — ABNORMAL HIGH (ref 24–36)

## 2018-03-09 MED ORDER — APIXABAN 2.5 MG PO TABS
2.5000 mg | ORAL_TABLET | Freq: Two times a day (BID) | ORAL | Status: DC
  Administered 2018-03-09 – 2018-03-12 (×7): 2.5 mg via ORAL
  Filled 2018-03-09 (×7): qty 1

## 2018-03-09 MED ORDER — METOPROLOL TARTRATE 25 MG PO TABS
25.0000 mg | ORAL_TABLET | Freq: Two times a day (BID) | ORAL | Status: DC
  Administered 2018-03-09 – 2018-03-12 (×6): 25 mg via ORAL
  Filled 2018-03-09 (×6): qty 1

## 2018-03-09 NOTE — Progress Notes (Signed)
Patient's son brought in [employer]. Copies were made and placed in patient's chart and originals were given back to patient's son.

## 2018-03-09 NOTE — Progress Notes (Signed)
ANTICOAGULATION CONSULT NOTE  Pharmacy Consult for heparin Indication: atrial fibrillation  Patient Measurements: Height: 5\' 3"  (160 cm) Weight: 96 lb 8 oz (43.8 kg) IBW/kg (Calculated) : 52.4  Vital Signs: Temp: 97.6 F (36.4 C) (04/05 0425) Temp Source: Oral (04/05 0040) BP: 145/66 (04/05 0845) Pulse Rate: 88 (04/05 0845)  Labs: Recent Labs    03/06/18 1253 03/07/18 0335 03/08/18 2158 03/08/18 2333 03/09/18 0752  HGB  --   --   --   --  11.4*  HCT  --   --   --   --  36.4  PLT  --   --   --   --  131*  APTT  --   --   --  88* 100*  HEPARINUNFRC  --   --  >2.20*  --   --   CREATININE 1.76* 1.58*  --   --   --     Medical History: Past Medical History:  Diagnosis Date  . History of breast cancer   . History of uterine cancer 1982  . Hyperlipidemia   . Hypertension   . Osteoporosis   . Prediabetes   . Vitamin D deficiency     Assessment: 84 yo on Eliquis PTA for Afib. Has not received since 4/2. Now to start heparin as bridge while patient NPO. Last aPTT is up to 100. Hgb 11.4 and plts 131.  Goal of Therapy:  Heparin level 0.3-0.7 units/ml Monitor platelets by anticoagulation protocol: Yes  Plan:  Decrease heparin gtt slightly to 700 units/hr Monitor daily heparin level, CBC, s/s of bleed F/U restart of Eliquis  Elenor Quinones, PharmD, [facility] Clinical Pharmacist Pager [phone] 03/09/2018 8:55 AM

## 2018-03-09 NOTE — Plan of Care (Signed)
  Problem: Clinical Measurements: Goal: Respiratory complications will improve Outcome: Not Progressing Note:  Pt has increased coarse crackles and expiratory wheezes. Pt O2 saturation 93% on 9 L [place]. Tachypnea when awake.

## 2018-03-09 NOTE — Progress Notes (Signed)
  Speech Language Pathology Treatment: Dysphagia  Patient Details Name: Brooke Conley MRN: [id number] DOB: 1927/10/09 Today's Date: 03/09/2018 Time: 5053-9767 SLP Time Calculation (min) (ACUTE ONLY): 16 min  Assessment / Plan / Recommendation Clinical Impression  ST follow up for PO readiness.  Oral care was provided prior to PO trials.  Patient's voice sounded a bit stronger compared to previous charting.  Verbal output was limited but the patient did follow simple 1 step commands.  Nurse reported that she did well with meds crushed in purees today but not as well yesterday.  Patient agreeable to PO trials and was observed with ice chips, thin liquids by spoon sips and pureed material.  Anterior escape was seen given liquids.  Suspect delayed oral transit and possible delayed swallow trigger.  Patient observed to require multiple swallows to clear each bolus.  When queried she stated that material felt stuck in her throat raising the question for pharyngeal residue.  Hyo-laryngeal excursion was appreciated to palpation.  No overt s/s of aspiration were seen and patient observed to have clear voice post PO intake.  However, she appeared to have increased work of breathing.  Given this recommend that she remain NPO x ice chips following oral care and meds crushed in pureed material.  ST will continue to follow for PO readiness.    HPI HPI: Pt is an 84 y.o. female with h/o breast/uterine cancer, hypertension, hyperlipidemia who presents with c/o fall and ? syncope wtih PTX, L distal clavicular fx, and L rib fx (3-5). Hospitalization was complicated by AMS, fever, and suspected aspiration event. CT Head 4/2 was negative but CXR 4/3 showed development of bibasilar airspace disease which may reflect aspiration or PNA.      SLP Plan  Continue with current plan of care       Recommendations  Diet recommendations: NPO;Other(comment)(ice chips after oral care, meds crushed in purees) Medication  Administration: Crushed with puree Postural Changes and/or Swallow Maneuvers: Seated upright 90 degrees                Oral Care Recommendations: Oral care prior to ice chip/H20;Oral care QID Follow up Recommendations: Skilled Nursing facility SLP Visit Diagnosis: Dysphagia, unspecified (R13.10) Plan: Continue with current plan of care       [place], [place], [place] [facility] SLP [phone]  Lamar Sprinkles 03/09/2018, 10:44 AM

## 2018-03-09 NOTE — Clinical Social Work Note (Signed)
Bed offers were emailed to pt's son, per request.  Loletha Grayer, [place]

## 2018-03-09 NOTE — Progress Notes (Signed)
PROGRESS NOTE    Brooke Conley  HYI:[id number] DOB: 1927/08/05 DOA: 02/16/2018 PCP: [name], MD   Brief Narrative: Brooke Conley is a 84 y.o. female with a history of breast cancer, hypertension, hyperlipidemia. She presented with syncope with subsequent rib/clavicular fractures and pneumothorax.   Assessment & Plan:   Principal Problem:   Pneumothorax Active Problems:   Hypertension   Hypothyroid   Syncope   CKD (chronic kidney disease) stage 3, GFR 30-59 ml/min (HCC)   Aspiration into airway   Left apical pneumothorax Left distal clavicular fracture Multiple left rib fractures (3-5) Pneumothorax resolved on most recent chest x-ray -Trauma recommendations: outpatient orthopedic/trauma follow-up in 2-3 weeks -Sling for left arm  Fever Patient started empirically on antibiotics. No infectious source. Blood cultures obtained. Urine culture obtained after antibiotics and does not appear to have been received. Continues to be afebrile -Continue Cefepime -Blood cultures pending (no growth to date)  Acute respiratory distress Acute respiratory failure with hypoxia Stable. Chest x-ray with some atelectasis -SLP evaluation: NPO -Wean oxygen as able  Acute metabolic encephalopathy Thought secondary to medication effect vs infection. Patient with elevated TSH and low free T3 with normal T4. Unsure if patient is actually taking Synthroid as prescribed. Improving. EEG normal. -MRI brain pending  Essential hypertension Hypertensive -Continue metoprolol -Continue hydralazine prn  Hypothyroidism Unsure if patient is adherent with regimen. TSH elevated. Free T4 normal and free T3 low -Continue Synthroid for now until confirmation of medications  Paroxysmal atrial fibrillation Stable -Continue metoprolol, Eliquis  History of breast cancer Osteoporosis -Continue Tamoxifen  Lung nodules Outpatient follow-up with repeat non-contrast chest CT in 3-6  months  Acute urinary retention ?infection. Patient started on antibiotics on 4/2 so unsure. -If continues to need, I/O, will need foley  Malnutrition NPO currently. Discussed with son that we may need to consider feeding tube if mental status does not reverse. Unable to get in touch with son today. -Will consider Cortrak   DVT prophylaxis: Eliquis Code Status:   Code Status: Full Code Family Communication: Called son, went to voicemail Disposition Plan: Discharge likely to SNF when medically stable, off of oxygen, improvement of mental status   Consultants:   Trauma  Orthopedic surgery  Procedures:   None  Antimicrobials:  Vancomycin (4/2)  Cefepime (4/2>>   Subjective: Patient continues to be less interactive. Does not speak but answers questions appropriately.  Objective: Vitals:   03/09/18 0700 03/09/18 0735 03/09/18 0845 03/09/18 1124  BP: (!) 160/70  (!) 145/66 (!) 157/70  Pulse: 81  88 91  Resp: 19  19 (!) 28  Temp:    (!) 97.4 F (36.3 C)  TempSrc:    Oral  SpO2: 93% 93% 95% 95%  Weight:      Height:        Intake/Output Summary (Last 24 hours) at 03/09/2018 1208 Last data filed at 03/09/2018 0600 Gross per 24 hour  Intake 1922.13 ml  Output 500 ml  Net 1422.13 ml   Filed Weights   03/04/18 1848 03/08/18 0446 03/09/18 0429  Weight: 46.5 kg (102 lb 8 oz) 51.1 kg (112 lb 10.5 oz) 43.8 kg (96 lb 8 oz)    Examination:  General exam: Appears calm and comfortable Respiratory system: Diminished breath sounds with some mild accessory muscle usage Cardiovascular system: Regular rate and rhythm. Normal S1 and S2. No heart murmurs present. No extra heart sounds Gastrointestinal system: Soft, non-tender, non-distended, no guarding, no rebound, no masses felt, decreased bowel sounds  Central nervous system: Alert Extremities: No edema. No calf tenderness Skin: No cyanosis. No rashes Psychiatry: Withdrawn, flat affect    Data Reviewed: I have  personally reviewed following labs and imaging studies  CBC: Recent Labs  Lab 02/09/2018 2041 03/04/18 1041 03/09/18 0752  WBC 10.7* 7.8 7.7  HGB 12.2 12.5 11.4*  HCT 37.1 37.8 36.4  MCV 94.9 94.5 96.3  PLT 146* 152 741*   Basic Metabolic Panel: Recent Labs  Lab 02/13/2018 2041 02/28/2018 2145 03/04/18 1041 03/06/18 1253 03/07/18 0335  NA 136  --  135 132* 136  K 4.1  --  3.5 3.9 3.8  CL 99*  --  99* 99* 97*  CO2 24  --  23 24 25   GLUCOSE 161*  --  116* 201* 138*  BUN 26*  --  23* 36* 37*  CREATININE 1.70*  --  1.59* 1.76* 1.58*  CALCIUM 9.2  --  8.8* 8.8* 8.9  MG  --  2.0  --   --   --    GFR: Estimated Creatinine Clearance: 17 mL/min (A) (by C-G formula based on SCr of 1.58 mg/dL (H)). Liver Function Tests: Recent Labs  Lab 03/04/18 1041  AST 31  ALT 23  ALKPHOS 63  BILITOT 1.0  PROT 6.7  ALBUMIN 3.4*   No results for input(s): LIPASE, AMYLASE in the last 168 hours. Recent Labs  Lab 03/08/18 1409  AMMONIA 24   Coagulation Profile: No results for input(s): INR, PROTIME in the last 168 hours. Cardiac Enzymes: Recent Labs  Lab 03/04/18 1041 03/04/18 1910 03/05/18 0213  CKTOTAL 149  --   --   CKMB 2.2  --   --   TROPONINI 0.03* 0.03* 0.04*   BNP (last 3 results) No results for input(s): PROBNP in the last 8760 hours. HbA1C: No results for input(s): HGBA1C in the last 72 hours. CBG: Recent Labs  Lab 02/14/2018 2016 03/06/18 0645  GLUCAP 178* 132*   Lipid Profile: No results for input(s): CHOL, HDL, LDLCALC, TRIG, CHOLHDL, LDLDIRECT in the last 72 hours. Thyroid Function Tests: Recent Labs    03/07/18 1404  FREET4 0.85   Anemia Panel: No results for input(s): VITAMINB12, FOLATE, FERRITIN, TIBC, IRON, RETICCTPCT in the last 72 hours. Sepsis Labs: Recent Labs  Lab 03/07/18 0920  PROCALCITON 5.47    Recent Results (from the past 240 hour(s))  MRSA PCR Screening     Status: None   Collection Time: 03/04/18  6:59 PM  Result Value Ref Range  Status   MRSA by PCR NEGATIVE NEGATIVE Final    Comment:        The GeneXpert MRSA Assay (FDA approved for NASAL specimens only), is one component of a comprehensive MRSA colonization surveillance program. It is not intended to diagnose MRSA infection nor to guide or monitor treatment for MRSA infections. Performed at [facility], [place] [address] [place], [place] [address]   Culture, blood (Routine X 2) w Reflex to ID Panel     Status: None (Preliminary result)   Collection Time: 03/06/18 10:50 AM  Result Value Ref Range Status   Specimen Description BLOOD RIGHT ANTECUBITAL  Final   Special Requests IN PEDIATRIC BOTTLE Blood Culture adequate volume  Final   Culture   Final    NO GROWTH 2 DAYS Performed at [facility], [place] [address] [place], [place] [address]    Report Status PENDING  Incomplete  Culture, blood (Routine X 2) w Reflex to ID  Panel     Status: None (Preliminary result)   Collection Time: 03/06/18 10:55 AM  Result Value Ref Range Status   Specimen Description BLOOD LEFT ANTECUBITAL  Final   Special Requests   Final    BOTTLES DRAWN AEROBIC ONLY Blood Culture adequate volume   Culture   Final    NO GROWTH 2 DAYS Performed at [facility], [address], [place], [place] [address]    Report Status PENDING  Incomplete         Radiology Studies: Dg Chest Port 1 View  Result Date: 03/08/2018 CLINICAL DATA:  [place] EXAM: PORTABLE CHEST 1 VIEW COMPARISON:  March 06, 2018 FINDINGS: There is atelectatic change in the lung bases. There is apical pleural thickening bilaterally, stable. There is no edema or consolidation. Heart size and pulmonary vascularity are normal. No adenopathy. There are calcified breast implants bilaterally. No pneumothorax evident. There is evidence of a fracture of the lateral left clavicle as well as several rib fractures superiorly on the left. IMPRESSION: Currently no pneumothorax. Bibasilar atelectasis. No edema or  consolidation. Stable cardiac silhouette. Apical pleural thickening noted bilaterally. Several superior rib fractures on the left as well as a fracture of the lateral left clavicle, mildly displaced. Electronically Signed   By: Lowella Grip III M.D.   On: 03/08/2018 12:38        Scheduled Meds: . amiodarone  200 mg Oral BID  . apixaban  2.5 mg Oral BID  . ipratropium-albuterol  3 mL Nebulization TID  . levothyroxine  25 mcg Oral QAC breakfast  . mouth rinse  15 mL Mouth Rinse BID  . metoprolol tartrate  2.5 mg Intravenous Q6H  . tamoxifen  20 mg Oral Daily   Continuous Infusions: . ceFEPime (MAXIPIME) IV Stopped (03/09/18 1030)  . dextrose 5 % and 0.9% NaCl 75 mL/hr at 03/09/18 0429     LOS: 6 days     Cordelia Poche, MD [facility] 03/09/2018, 12:08 PM Pager: [phone]  If 7PM-7AM, please contact night-coverage [email or web address] Password [email or web address] 03/09/2018, 12:08 PM

## 2018-03-09 NOTE — Progress Notes (Addendum)
Initial Nutrition Assessment  DOCUMENTATION CODES:   Severe malnutrition in context of chronic illness, Underweight  INTERVENTION:    If TF started recommend:  Initiate Jevity 1.2 formula at 15 ml/hr and increase by 10 ml every 12 hours to goal rate of 45 ml/hr  Provides 1296 kcals, 60 gm protein, 871 ml of free water    Pt at refeeding risk >> will need to monitor Mg, Phos, K+ levels  NUTRITION DIAGNOSIS:   Severe Malnutrition related to chronic illness(CKD) as evidenced by severe muscle depletion, severe fat depletion  GOAL:   Patient will meet greater than or equal to 90% of their needs  MONITOR:   Diet advancement, PO intake, Labs, Weight trends, Skin, I & O's  REASON FOR ASSESSMENT:   Low Braden  ASSESSMENT:   84 y.o. Female with PMH of breast cancer, hypertension, hyperlipidemia. She presented with syncope with subsequent rib/clavicular fractures and pneumothorax.  Pt awake and sitting up in bed.  She is unable to answer RD interview questions. Pt was on a PO diet from 3/31 >> 4/2. PO intake was very poor at 10% per flowsheet records.  Speech Path following. Recommending NPO status at this time. Low braden score places pt at risk for skin breakdown. Labs and medications reviewed.  Discussed consideration of TF initiation via Cortrak feeding tube (10 F) with Dr. Lonny Prude.  NUTRITION - FOCUSED PHYSICAL EXAM:    Most Recent Value  Orbital Region  Moderate depletion  Upper Arm Region  Severe depletion  Thoracic and Lumbar Region  Unable to assess  Buccal Region  Moderate depletion  Temple Region  Moderate depletion  Clavicle Bone Region  Severe depletion  Clavicle and Acromion Bone Region  Severe depletion  Scapular Bone Region  Unable to assess  Dorsal Hand  Unable to assess  Patellar Region  Severe depletion  Anterior Thigh Region  Severe depletion  Posterior Calf Region  Severe depletion  Edema (RD Assessment)  None    Diet Order:  Diet NPO time  specified Except for: [employer]  EDUCATION NEEDS:   Not appropriate for education at this time  Skin:  Skin Assessment: Reviewed RN Assessment  Last BM:  3/28  Height:   Ht Readings from Last 1 Encounters:  03/04/18 5\' 3"  (1.6 m)    Weight:   Wt Readings from Last 1 Encounters:  03/09/18 96 lb 8 oz (43.8 kg)    Ideal Body Weight:  52.2 kg  BMI:  Body mass index is 17.09 kg/m.  Estimated Nutritional Needs:   Kcal:  1100-1300  Protein:  55-70 gm  Fluid:  >/= 1.5 L  Arthur Holms, RD, LDN Pager #: [phone] After-Hours Pager #: [phone]

## 2018-03-09 NOTE — Progress Notes (Signed)
Pharmacy Antibiotic Note  Brooke Conley is a 84 y.o. female admitted on 02/26/2018 with a fall.  Pharmacy has been consulted for Vancomycin and Cefepime dosing for fever of unknown origin.  Day #4 of abx for FUO. CXR yesterday shows possible aspiration or PNA. Now afebrile, WBC wnl.  Plan: Continue cefepime 1g IV q24h Monitor clinical picture, renal function, VT prn F/U C&S, abx deescalation / LOT  Addendum 03/09/2018, 8:56 AM Lab Results  Component Value Date   CREATININE 1.58 (H) 03/07/2018   CREATININE 1.76 (H) 03/06/2018   CREATININE 1.59 (H) 03/04/2018    Today's SCr is stable from her prior values. No adjustment needed to the antibiotic regimen ordered.  Height: 5\' 3"  (160 cm) Weight: 96 lb 8 oz (43.8 kg) IBW/kg (Calculated) : 52.4  Temp (24hrs), Avg:97.9 F (36.6 C), Min:97.5 F (36.4 C), Max:98.5 F (36.9 C)  Recent Labs  Lab 02/23/2018 2041 03/04/18 1041 03/06/18 1253 03/07/18 0335 03/09/18 0752  WBC 10.7* 7.8  --   --  7.7  CREATININE 1.70* 1.59* 1.76* 1.58*  --     Estimated Creatinine Clearance: 17 mL/min (A) (by C-G formula based on SCr of 1.58 mg/dL (H)).    Allergies  Allergen Reactions  . Penicillins Rash    Antimicrobials this admission: Vanc 4/2>> 4/3 Cefepime 4/2>>  Microbiology results: 4/2 UCx: sent 4/2 BCx: ngtd 3/31 MRSA PCR: neg  Thank you for allowing pharmacy to be a part of this patient's care.  Reginia Naas 03/09/2018 8:56 AM

## 2018-03-10 ENCOUNTER — Inpatient Hospital Stay (HOSPITAL_COMMUNITY): Payer: Medicare Other

## 2018-03-10 DIAGNOSIS — F4321 Adjustment disorder with depressed mood: Secondary | ICD-10-CM

## 2018-03-10 DIAGNOSIS — E43 Unspecified severe protein-calorie malnutrition: Secondary | ICD-10-CM

## 2018-03-10 LAB — BASIC METABOLIC PANEL
ANION GAP: 7 (ref 5–15)
BUN: 36 mg/dL — ABNORMAL HIGH (ref 6–20)
CALCIUM: 9 mg/dL (ref 8.9–10.3)
CHLORIDE: 115 mmol/L — AB (ref 101–111)
CO2: 21 mmol/L — AB (ref 22–32)
Creatinine, Ser: 1.28 mg/dL — ABNORMAL HIGH (ref 0.44–1.00)
GFR calc non Af Amer: 36 mL/min — ABNORMAL LOW (ref >60)
GFR, EST AFRICAN AMERICAN: 42 mL/min — AB (ref >60)
GLUCOSE: 198 mg/dL — AB (ref 65–99)
Potassium: 3.4 mmol/L — ABNORMAL LOW (ref 3.5–5.1)
Sodium: 143 mmol/L (ref 135–145)

## 2018-03-10 LAB — CBC
HEMATOCRIT: 39.2 % (ref 36.0–46.0)
HEMOGLOBIN: 12.8 g/dL (ref 12.0–15.0)
MCH: 31.6 pg (ref 26.0–34.0)
MCHC: 32.7 g/dL (ref 30.0–36.0)
MCV: 96.8 fL (ref 78.0–100.0)
Platelets: 125 10*3/uL — ABNORMAL LOW (ref 150–400)
RBC: 4.05 MIL/uL (ref 3.87–5.11)
RDW: 15.7 % — AB (ref 11.5–15.5)
WBC: 11.3 10*3/uL — AB (ref 4.0–10.5)

## 2018-03-10 MED ORDER — FUROSEMIDE 10 MG/ML IJ SOLN
20.0000 mg | Freq: Once | INTRAMUSCULAR | Status: AC
  Administered 2018-03-10: 20 mg via INTRAVENOUS
  Filled 2018-03-10: qty 2

## 2018-03-10 MED ORDER — SERTRALINE HCL 25 MG PO TABS
25.0000 mg | ORAL_TABLET | Freq: Every day | ORAL | Status: DC
  Administered 2018-03-10 – 2018-03-12 (×3): 25 mg via ORAL
  Filled 2018-03-10 (×3): qty 1

## 2018-03-10 NOTE — Consult Note (Signed)
[facility] Face-to-Face Psychiatry Consult   Reason for Consult:  ''Patient is significantly withdrawn. Does not appear to be medical. Patient presented s/p trauma'' Referring Physician:  Dr. Lonny Prude Patient Identification: Brooke Conley MRN:  [id number] Principal Diagnosis: Adjustment disorder with depressed mood Diagnosis:   Patient Active Problem List   Diagnosis Date Noted  . Protein-calorie malnutrition, severe [E43] 03/10/2018  . Adjustment disorder with depressed mood [F43.21] 03/10/2018  . Aspiration into airway [T17.908A]   . Pneumothorax [J93.9] 02/14/2018  . CKD (chronic kidney disease) stage 3, GFR 30-59 ml/min (HCC) [N18.3] 02/17/2018  . Atrial fibrillation with RVR ([place]) [I48.91] 09/20/2017  . Syncope [R55] 09/19/2017  . Encounter for general adult medical examination with abnormal findings [Z00.01] 07/26/2016  . Hypothyroid [E03.9] 07/06/2015  . Medicare annual wellness visit, initial [Z00.00] 07/06/2015  . Medication management [Z79.899] 06/26/2014  . Prediabetes [R73.03]   . Hypertension [I10]   . Hyperlipidemia [E78.5]   . Osteoporosis [M81.0]   . History of breast cancer [Z85.3]   . Vitamin D deficiency [E55.9]   . History of uterine cancer [Z85.42]     Total Time spent with patient: 45 minutes  Subjective:   Brooke Conley is a 84 y.o. female patient admitted with Conley, ?syncope.  HPI:  Patient with history of breast cancer, hypertension, hyperlipidemia who denies prior history of mental illness. She states that she was brought to the hospital after a Conley. Patient complaint of generalized body pain and as a result it was difficult for her to give a free flow history of the events that lead to her hospitalization. Chart review indicates that she sustained rib/clavicular fractures following  the Conley. Patient reports feeling depressed, hopeless because of her inability to function as she used to before she was admitted. She endorses low energy level, lack of motivation  and excessive worries about her health but she denies suicidal thoughts, psychosis or delusions.   Past Psychiatric History: none reported by patient  Risk to Self: Is patient at risk for suicide?: No Risk to Others:   Prior Inpatient Therapy:   Prior Outpatient Therapy:    Past Medical History:  Past Medical History:  Diagnosis Date  . History of breast cancer   . History of uterine cancer 1982  . Hyperlipidemia   . Hypertension   . Osteoporosis   . Prediabetes   . Vitamin D deficiency     Past Surgical History:  Procedure Laterality Date  . BREAST SURGERY Right 2007   lumpectomy  . CATARACT EXTRACTION Right 2010  . COSMETIC SURGERY Bilateral 1979   Breast  . TONSILLECTOMY    . TOTAL ABDOMINAL HYSTERECTOMY W/ BILATERAL SALPINGOOPHORECTOMY     Family History:  Family History  Problem Relation Age of Onset  . Cancer Mother        lung  . Hypertension Father    Family Psychiatric  History:  Social History:  Social History   Substance and Sexual Activity  Alcohol Use No     Social History   Substance and Sexual Activity  Drug Use No    Social History   Socioeconomic History  . Marital status: Divorced    Spouse name: Not on file  . Number of children: Not on file  . Years of education: Not on file  . Highest education level: Not on file  Occupational History  . Not on file  Social Needs  . Financial resource strain: Not on file  . Food insecurity:    Worry:  Not on file    Inability: Not on file  . Transportation needs:    Medical: Not on file    Non-medical: Not on file  Tobacco Use  . Smoking status: Never Smoker  . Smokeless tobacco: Never Used  Substance and Sexual Activity  . Alcohol use: No  . Drug use: No  . Sexual activity: Not on file  Lifestyle  . Physical activity:    Days per week: Not on file    Minutes per session: Not on file  . Stress: Not on file  Relationships  . Social connections:    Talks on phone: Not on file    Gets  together: Not on file    Attends religious service: Not on file    Active member of club or organization: Not on file    Attends meetings of clubs or organizations: Not on file    Relationship status: Not on file  Other Topics Concern  . Not on file  Social History Narrative  . Not on file   Additional Social History:    Allergies:   Allergies  Allergen Reactions  . Penicillins Rash    Labs:  Results for orders placed or performed during the hospital encounter of 02/08/2018 (from the past 48 hour(s))  Ammonia     Status: None   Collection Time: 03/08/18  2:09 PM  Result Value Ref Range   Ammonia 24 9 - 35 umol/L    Comment: Performed at [facility], [place] [address], [place], [place] [address]  Heparin level (unfractionated)     Status: Abnormal   Collection Time: 03/08/18  9:58 PM  Result Value Ref Range   Heparin Unfractionated >2.20 (H) 0.30 - 0.70 IU/mL    Comment: RESULTS CONFIRMED BY MANUAL DILUTION IF HEPARIN RESULTS ARE BELOW EXPECTED VALUES, AND PATIENT DOSAGE HAS BEEN CONFIRMED, SUGGEST FOLLOW UP TESTING OF ANTITHROMBIN III LEVELS. Performed at [facility], [place] [address], [place], [place] [address]   APTT     Status: Abnormal   Collection Time: 03/08/18 11:33 PM  Result Value Ref Range   aPTT 88 (H) 24 - 36 seconds    Comment:        IF BASELINE aPTT IS ELEVATED, SUGGEST PATIENT RISK ASSESSMENT BE USED TO DETERMINE APPROPRIATE ANTICOAGULANT THERAPY. Performed at [facility], [place] [address], [place], [place] [address]   CBC     Status: Abnormal   Collection Time: 03/09/18  7:52 AM  Result Value Ref Range   WBC 7.7 4.0 - 10.5 K/uL   RBC 3.78 (L) 3.87 - 5.11 MIL/uL   Hemoglobin 11.4 (L) 12.0 - 15.0 g/dL   HCT 36.4 36.0 - 46.0 %   MCV 96.3 78.0 - 100.0 fL   MCH 30.2 26.0 - 34.0 pg   MCHC 31.3 30.0 - 36.0 g/dL   RDW 15.2 11.5 - 15.5 %   Platelets 131 (L) 150 - 400 K/uL    Comment: Performed at [facility], [place] [address], [place], [place] [address]  APTT     Status: Abnormal   Collection Time: 03/09/18  7:52 AM  Result Value Ref Range   aPTT 100 (H) 24 - 36 seconds    Comment:        IF BASELINE aPTT IS ELEVATED, SUGGEST PATIENT RISK ASSESSMENT BE USED TO DETERMINE APPROPRIATE ANTICOAGULANT THERAPY. Performed at [facility], [place] [address], [place], [place] [address]   CBC  Status: Abnormal   Collection Time: 03/10/18  2:43 AM  Result Value Ref Range   WBC 11.3 (H) 4.0 - 10.5 K/uL   RBC 4.05 3.87 - 5.11 MIL/uL   Hemoglobin 12.8 12.0 - 15.0 g/dL   HCT 39.2 36.0 - 46.0 %   MCV 96.8 78.0 - 100.0 fL   MCH 31.6 26.0 - 34.0 pg   MCHC 32.7 30.0 - 36.0 g/dL   RDW 15.7 (H) 11.5 - 15.5 %   Platelets 125 (L) 150 - 400 K/uL    Comment: Performed at [place] [address] [place],  [address]    Current Facility-Administered Medications  Medication Dose Route Frequency Provider Last Rate Last Dose  . acetaminophen (TYLENOL) suppository 325 mg  325 mg Rectal Q4H PRN Velvet Bathe, MD   325 mg at 03/07/18 1541  . acetaminophen (TYLENOL) tablet 650 mg  650 mg Oral Q6H PRN Velvet Bathe, MD      . albuterol (PROVENTIL) (2.5 MG/3ML) 0.083% nebulizer solution 2.5 mg  2.5 mg Nebulization Q4H PRN Gardiner Barefoot, NP   2.5 mg at 03/06/18 2350  . amiodarone (PACERONE) tablet 200 mg  200 mg Oral BID Mariel Aloe, MD   200 mg at 03/10/18 0910  . apixaban (ELIQUIS) tablet 2.5 mg  2.5 mg Oral BID Mariel Aloe, MD   2.5 mg at 03/10/18 0910  . ceFEPIme (MAXIPIME) 1 g in sodium chloride 0.9 % 100 mL IVPB  1 g Intravenous Q24H Norva Riffle, [facility]   Stopped at 03/10/18 0940  . dextrose 5 %-0.9 % sodium chloride infusion   Intravenous Continuous Gardiner Barefoot, NP 75 mL/hr at 03/09/18 2017    . hydrALAZINE (APRESOLINE) injection 10 mg  10 mg Intravenous Q6H PRN Jani Gravel, MD   10 mg at 03/10/18 0553  . ibuprofen (ADVIL,MOTRIN) tablet 600 mg  600 mg Oral Q6H PRN Judeth Horn, MD    600 mg at 03/10/18 0223  . ipratropium-albuterol (DUONEB) 0.5-2.5 (3) MG/3ML nebulizer solution 3 mL  3 mL Nebulization TID [name], MD   3 mL at 03/10/18 0729  . levothyroxine (SYNTHROID, LEVOTHROID) tablet 25 mcg  25 mcg Oral QAC breakfast Jani Gravel, MD   25 mcg at 03/10/18 0902  . MEDLINE mouth rinse  15 mL Mouth Rinse BID Velvet Bathe, MD   15 mL at 03/10/18 0910  . methocarbamol (ROBAXIN) tablet 500 mg  500 mg Oral Q8H PRN Rolm Bookbinder, MD      . metoprolol tartrate (LOPRESSOR) tablet 25 mg  25 mg Oral BID Mariel Aloe, MD   25 mg at 03/10/18 0910  . morphine 2 MG/ML injection 2 mg  2 mg Intravenous Q3H PRN Mariel Aloe, MD      . naloxone [facility]) injection 0.4 mg  0.4 mg Intravenous PRN Gardiner Barefoot, NP   0.4 mg at 03/06/18 3086  . oxyCODONE (Oxy IR/ROXICODONE) immediate release tablet 2.5 mg  2.5 mg Oral Q4H PRN Judeth Horn, MD      . tamoxifen (NOLVADEX) tablet 20 mg  20 mg Oral Daily Jani Gravel, MD   20 mg at 03/10/18 0913    Musculoskeletal: Strength & Muscle Tone: not tested Gait & Station: unable to stand Patient leans: N/A  Psychiatric Specialty Exam: Physical Exam  Psychiatric: Judgment and thought content normal. Her affect is blunt. Her speech is delayed. She is slowed and withdrawn. Cognition and memory are normal. She exhibits a depressed mood.  Review of Systems  Constitutional: Positive for malaise/fatigue.  HENT: Negative.   Eyes: Negative.   Respiratory: Positive for shortness of breath.   Psychiatric/Behavioral: Positive for depression.    Blood pressure 124/61, pulse (!) 101, temperature (!) 96.8 F (36 C), temperature source Axillary, resp. rate (!) 24, height 5\' 3"  (1.6 m), weight 44.8 kg (98 lb 11.2 oz), SpO2 95 %.Body mass index is 17.48 kg/m.  General Appearance: Casual  Eye Contact:  Fair  Speech:  Clear and Coherent and Slow  Volume:  Decreased  Mood:  Dysphoric  Affect:  Constricted  Thought Process:  Coherent   Orientation:  Other:  only to place and person  Thought Content:  Logical  Suicidal Thoughts:  No  Homicidal Thoughts:  No  Memory:  Immediate;   Fair Recent;   Fair Remote;   Fair  Judgement:  Intact  Insight:  Fair  Psychomotor Activity:  Psychomotor Retardation  Concentration:  Concentration: Fair and Attention Span: Fair  Recall:  [employer] of Knowledge:  unable to assess  Language:  Good  Akathisia:  No  Handed:  Right  AIMS (if indicated):     Assets:  Communication Skills Desire for Improvement  ADL's:  Unable to assess  Cognition:  WNL  Sleep:   fair     Treatment Plan Summary: 84 year old woman who denies prior history of mental illness. She was admitted to the hospital following a Conley. Patient now endorses depressive symptoms secondary to being overwhelmed by her current health status. Patient is showing signs and symptoms of depression, most likely related to current stressor.  Diagnosis:  Adjustment disorder with depression  Plan/ Recommendation: Patient may benefit from a trial of low dose antidepressant. Will recommend Zoloft 25 mg tablet, 1 tab daily for depression. Re-consult psych service if depression does not improve in 1 week  Disposition: No evidence of imminent risk to self or others at present.   Patient does not meet criteria for psychiatric inpatient admission. Supportive therapy provided about ongoing stressors.  Corena Pilgrim, MD 03/10/2018 1:00 PM

## 2018-03-10 NOTE — Progress Notes (Signed)
03/10/2018 Bladder scan was done she had 90cc in bladder. Dr R. Nettey was made aware. [facility] RN.

## 2018-03-10 NOTE — Progress Notes (Signed)
  Speech Language Pathology Treatment: Dysphagia  Patient Details Name: Brooke Conley MRN: [id number] DOB: 1926-12-27 Today's Date: 03/10/2018 Time: 4492-0100 SLP Time Calculation (min) (ACUTE ONLY): 25 min  Assessment / Plan / Recommendation Clinical Impression  Skilled assessment of consumption of puree/thin liquids and nectar-thickened liquids with tsp amounts with noted intermittent oral holding, decreased posterior propulsion (which improved with min verbal cues) and audible swallow with thin via tsp which could be concerning for pharyngeal weakness, but no overt s/s of aspiration noted during consumption.  Nursing stated she was able to take medications with puree and has been consuming ice chips without apparent difficulty this date; discussed in detail swallowing anatomy/function and aspiration precautions/swallowing precautions for initiating Dysphagia 1 (puree) diet with tsp amounts of thin liquids with family and nursing staff with conservative approach and to cease diet if any s/s of aspiration noted with fatigue and swallow precautions posted in pt room; ST will continue to f/u for diet tolerance while in acute setting and need for potential objective assessment.  HPI HPI: Pt is an 84 y.o. female with h/o breast/uterine cancer, hypertension, hyperlipidemia who presents with c/o fall and ? syncope wtih PTX, L distal clavicular fx, and L rib fx (3-5). Hospitalization was complicated by AMS, fever, and suspected aspiration event. CT Head 4/2 was negative but CXR 4/3 showed development of bibasilar airspace disease which may reflect aspiration or PNA.      SLP Plan  Continue with current plan of care       Recommendations  Diet recommendations: Dysphagia 1 (puree);Thin liquid Liquids provided via: Teaspoon Medication Administration: Crushed with puree Supervision: Full supervision/cueing for compensatory strategies;Trained caregiver to feed patient Compensations: Slow rate;Small  sips/bites;Multiple dry swallows after each bite/sip;Effortful swallow Postural Changes and/or Swallow Maneuvers: Seated upright 90 degrees                Oral Care Recommendations: Oral care before and after PO Follow up Recommendations: Skilled Nursing facility SLP Visit Diagnosis: Dysphagia, unspecified (R13.10) Plan: Continue with current plan of care                       Elvina Sidle, M.S., CCC-SLP 03/10/2018, 5:13 PM

## 2018-03-10 NOTE — Progress Notes (Signed)
PROGRESS NOTE    Brooke Conley  YQI:[id number] DOB: Jan 28, 1927 DOA: 02/20/2018 PCP: [name], MD   Brief Narrative: Brooke Conley is a 84 y.o. female with a history of breast cancer, hypertension, hyperlipidemia. She presented with syncope with subsequent rib/clavicular fractures and pneumothorax.   Assessment & Plan:   Principal Problem:   Pneumothorax Active Problems:   Hypertension   Hypothyroid   Syncope   CKD (chronic kidney disease) stage 3, GFR 30-59 ml/min (HCC)   Aspiration into airway   Protein-calorie malnutrition, severe   Left apical pneumothorax Left distal clavicular fracture Multiple left rib fractures (3-5) Pneumothorax resolved on most recent chest x-ray. -Trauma recommendations: outpatient orthopedic/trauma follow-up in 2-3 weeks -Sling for left arm  Fever Patient started empirically on antibiotics. No infectious source. Blood cultures obtained. Urine culture obtained after antibiotics and does not appear to have been received. Continues to be afebrile -Continue Cefepime -Blood cultures pending (no growth to date)  Acute respiratory distress Acute respiratory failure with hypoxia Last chest x-ray with some atelectasis. Worsening rhonchi on exam today. Still with increased work of breathing -SLP evaluation: NPO; have them reevaluate before starting patient on tube feeds -Wean oxygen as able -Repeat chest x-ray, CT scan if not helpful  Acute metabolic encephalopathy Thought secondary to medication effect vs infection. Patient with elevated TSH and low free T3 with normal T4. Unsure if patient is actually taking Synthroid as prescribed. EEG normal. MRI without acute abnormality. Possibly secondary to Hypothyroidism.   Essential hypertension Hypertensive -Continue metoprolol -Continue hydralazine prn  Hypothyroidism Unsure if patient is adherent with regimen. TSH elevated. Free T4 normal and free T3 low -Continue Synthroid  Paroxysmal  atrial fibrillation Stable -Continue metoprolol, Eliquis  History of breast cancer Osteoporosis -Continue Tamoxifen  Lung nodules Outpatient follow-up with repeat non-contrast chest CT in 3-6 months  Acute urinary retention ?infection. Patient started on antibiotics on 4/2 so unsure. Afebrile. -If continues to need, I/O, will need foley  Malnutrition NPO currently. Discussed with son that we would need to consider artificial nutrition. Patient wants to wait for her son to come in. -Cortrak today vs advancement of diet vs comfort feeds depending on discussions   DVT prophylaxis: Eliquis Code Status:   Code Status: Full Code Family Communication: None at bedside Disposition Plan: Discharge likely to SNF when medically stable, off of oxygen, improvement of mental status   Consultants:   Trauma  Orthopedic surgery  Procedures:   None  Antimicrobials:  Vancomycin (4/2)  Cefepime (4/2>>   Subjective: More interactive today. Answers questions with sentences.  Objective: Vitals:   03/10/18 0553 03/10/18 0649 03/10/18 0732 03/10/18 0915  BP: (!) 197/66 (!) 195/84  124/61  Pulse: 83 87  (!) 101  Resp: (!) 24     Temp: 97.8 F (36.6 C)   (!) 96.8 F (36 C)  TempSrc: Oral   Axillary  SpO2: 95% 95% 92% 95%  Weight:      Height:        Intake/Output Summary (Last 24 hours) at 03/10/2018 1153 Last data filed at 03/10/2018 0910 Gross per 24 hour  Intake 1862.5 ml  Output 975 ml  Net 887.5 ml   Filed Weights   03/08/18 0446 03/09/18 0429 03/10/18 0500  Weight: 51.1 kg (112 lb 10.5 oz) 43.8 kg (96 lb 8 oz) 44.8 kg (98 lb 11.2 oz)    Examination:  General exam: Appears calm and comfortable Respiratory system: Worsened rhonchi on right side, accessory muscle usage. Cardiovascular  system: Regular rate and rhythm. Normal S1 and S2. No heart murmurs present. No extra heart sounds Gastrointestinal system: Soft, non-tender, non-distended, no guarding, no rebound, no  masses felt Central nervous system: Alert Extremities: No edema. No calf tenderness. Skin: No cyanosis. No rashes Psychiatry: Flat affect    Data Reviewed: I have personally reviewed following labs and imaging studies  CBC: Recent Labs  Lab 02/19/2018 2041 03/04/18 1041 03/09/18 0752 03/10/18 0243  WBC 10.7* 7.8 7.7 11.3*  HGB 12.2 12.5 11.4* 12.8  HCT 37.1 37.8 36.4 39.2  MCV 94.9 94.5 96.3 96.8  PLT 146* 152 131* 160*   Basic Metabolic Panel: Recent Labs  Lab 02/08/2018 2041 02/16/2018 2145 03/04/18 1041 03/06/18 1253 03/07/18 0335  NA 136  --  135 132* 136  K 4.1  --  3.5 3.9 3.8  CL 99*  --  99* 99* 97*  CO2 24  --  23 24 25   GLUCOSE 161*  --  116* 201* 138*  BUN 26*  --  23* 36* 37*  CREATININE 1.70*  --  1.59* 1.76* 1.58*  CALCIUM 9.2  --  8.8* 8.8* 8.9  MG  --  2.0  --   --   --    GFR: Estimated Creatinine Clearance: 17.4 mL/min (A) (by C-G formula based on SCr of 1.58 mg/dL (H)). Liver Function Tests: Recent Labs  Lab 03/04/18 1041  AST 31  ALT 23  ALKPHOS 63  BILITOT 1.0  PROT 6.7  ALBUMIN 3.4*   No results for input(s): LIPASE, AMYLASE in the last 168 hours. Recent Labs  Lab 03/08/18 1409  AMMONIA 24   Coagulation Profile: No results for input(s): INR, PROTIME in the last 168 hours. Cardiac Enzymes: Recent Labs  Lab 03/04/18 1041 03/04/18 1910 03/05/18 0213  CKTOTAL 149  --   --   CKMB 2.2  --   --   TROPONINI 0.03* 0.03* 0.04*   BNP (last 3 results) No results for input(s): PROBNP in the last 8760 hours. HbA1C: No results for input(s): HGBA1C in the last 72 hours. CBG: Recent Labs  Lab 02/13/2018 2016 03/06/18 0645  GLUCAP 178* 132*   Lipid Profile: No results for input(s): CHOL, HDL, LDLCALC, TRIG, CHOLHDL, LDLDIRECT in the last 72 hours. Thyroid Function Tests: Recent Labs    03/07/18 1404  FREET4 0.85   Anemia Panel: No results for input(s): VITAMINB12, FOLATE, FERRITIN, TIBC, IRON, RETICCTPCT in the last 72  hours. Sepsis Labs: Recent Labs  Lab 03/07/18 0920  PROCALCITON 5.47    Recent Results (from the past 240 hour(s))  MRSA PCR Screening     Status: None   Collection Time: 03/04/18  6:59 PM  Result Value Ref Range Status   MRSA by PCR NEGATIVE NEGATIVE Final    Comment:        The GeneXpert MRSA Assay (FDA approved for NASAL specimens only), is one component of a comprehensive MRSA colonization surveillance program. It is not intended to diagnose MRSA infection nor to guide or monitor treatment for MRSA infections. Performed at [facility], [place] [address] [place], [place] [address]   Culture, blood (Routine X 2) w Reflex to ID Panel     Status: None (Preliminary result)   Collection Time: 03/06/18 10:50 AM  Result Value Ref Range Status   Specimen Description BLOOD RIGHT ANTECUBITAL  Final   Special Requests IN PEDIATRIC BOTTLE Blood Culture adequate volume  Final   Culture   Final  NO GROWTH 4 DAYS Performed at [facility], [place] [address] [place], [place] [address]    Report Status PENDING  Incomplete  Culture, blood (Routine X 2) w Reflex to ID Panel     Status: None (Preliminary result)   Collection Time: 03/06/18 10:55 AM  Result Value Ref Range Status   Specimen Description BLOOD LEFT ANTECUBITAL  Final   Special Requests   Final    BOTTLES DRAWN AEROBIC ONLY Blood Culture adequate volume   Culture   Final    NO GROWTH 4 DAYS Performed at [facility], [place] [address], [place], [place] [address]    Report Status PENDING  Incomplete         Radiology Studies: Mr Brain Wo Contrast  Result Date: 03/09/2018 CLINICAL DATA:  Syncope. History of breast cancer, hypertension, hyperlipidemia. EXAM: MRI HEAD WITHOUT CONTRAST TECHNIQUE: Multiplanar, multiecho pulse sequences of the brain and surrounding structures were obtained without intravenous contrast. COMPARISON:  CT HEAD March 06, 2018 and MRI of the head September 19, 2017 FINDINGS:  INTRACRANIAL CONTENTS: No reduced diffusion to suggest acute ischemia. No susceptibility artifact to suggest hemorrhage. The ventricles and sulci are normal for patient's age. Old small RIGHT cerebellar infarct. Stable patchy supratentorial and pontine white matter FLAIR T2 hyperintensities. Prominent punctate T2 hyperintensities bilateral basal ganglia and to lesser extent thalami associated with chronic small vessel ischemic disease. No suspicious parenchymal signal, masses, mass effect. No abnormal extra-axial fluid collections. No extra-axial masses. VASCULAR: Normal major intracranial vascular flow voids present at skull base. SKULL AND UPPER CERVICAL SPINE: No abnormal sellar expansion. No suspicious calvarial bone marrow signal. Craniocervical junction maintained. SINUSES/ORBITS: The mastoid air-cells and included paranasal sinuses are well-aerated.The included ocular globes and orbital contents are non-suspicious. Status post bilateral ocular lens implants. OTHER: None. IMPRESSION: 1. No acute intracranial process. 2. Stable examination including mild to moderate chronic small vessel ischemic disease and old small RIGHT cerebellar infarcts. Electronically Signed   By: Elon Alas M.D.   On: 03/09/2018 16:14   Dg Chest Port 1 View  Result Date: 03/08/2018 CLINICAL DATA:  Rhonchi EXAM: PORTABLE CHEST 1 VIEW COMPARISON:  March 06, 2018 FINDINGS: There is atelectatic change in the lung bases. There is apical pleural thickening bilaterally, stable. There is no edema or consolidation. Heart size and pulmonary vascularity are normal. No adenopathy. There are calcified breast implants bilaterally. No pneumothorax evident. There is evidence of a fracture of the lateral left clavicle as well as several rib fractures superiorly on the left. IMPRESSION: Currently no pneumothorax. Bibasilar atelectasis. No edema or consolidation. Stable cardiac silhouette. Apical pleural thickening noted bilaterally. Several  superior rib fractures on the left as well as a fracture of the lateral left clavicle, mildly displaced. Electronically Signed   By: Lowella Grip III M.D.   On: 03/08/2018 12:38        Scheduled Meds: . amiodarone  200 mg Oral BID  . apixaban  2.5 mg Oral BID  . ipratropium-albuterol  3 mL Nebulization TID  . levothyroxine  25 mcg Oral QAC breakfast  . mouth rinse  15 mL Mouth Rinse BID  . metoprolol tartrate  25 mg Oral BID  . tamoxifen  20 mg Oral Daily   Continuous Infusions: . ceFEPime (MAXIPIME) IV Stopped (03/10/18 0940)  . dextrose 5 % and 0.9% NaCl 75 mL/hr at 03/09/18 2017     LOS: 7 days     Cordelia Poche, MD [facility] 03/10/2018, 11:53 AM  Pager: [phone]  If 7PM-7AM, please contact night-coverage [email or web address] Password [email or web address] 03/10/2018, 11:53 AM

## 2018-03-10 NOTE — Progress Notes (Signed)
03/10/2018 foley cath was removed at 1300 and urine was yellow. Patient have not voided Dr Alfonso Patten. [place]  was made aware.  Fluid was d/c and patient was give lasix 20 mg IV once at 1855. [facility].

## 2018-03-10 NOTE — Clinical Social Work Note (Signed)
Clinical Social Worker continuing to follow for support and discharge planning needs.  CSW spoke with patient son over the phone to obtain bed choice - patient son states that he is visiting and researching options today and will follow up later with bed choice.  Patient and family continuing to address wishes regarding patient nutrition.  CSW remains available for support and to facilitate patient discharge needs once medically stable.  Barbette Or, [place]

## 2018-03-11 ENCOUNTER — Inpatient Hospital Stay (HOSPITAL_COMMUNITY): Payer: Medicare Other

## 2018-03-11 DIAGNOSIS — R627 Adult failure to thrive: Secondary | ICD-10-CM

## 2018-03-11 DIAGNOSIS — Z515 Encounter for palliative care: Secondary | ICD-10-CM

## 2018-03-11 DIAGNOSIS — R0603 Acute respiratory distress: Secondary | ICD-10-CM

## 2018-03-11 DIAGNOSIS — Z66 Do not resuscitate: Secondary | ICD-10-CM

## 2018-03-11 LAB — CULTURE, BLOOD (ROUTINE X 2)
CULTURE: NO GROWTH
Culture: NO GROWTH
SPECIAL REQUESTS: ADEQUATE
Special Requests: ADEQUATE

## 2018-03-11 LAB — CBC
HEMATOCRIT: 37.2 % (ref 36.0–46.0)
HEMOGLOBIN: 11.7 g/dL — AB (ref 12.0–15.0)
MCH: 30.8 pg (ref 26.0–34.0)
MCHC: 31.5 g/dL (ref 30.0–36.0)
MCV: 97.9 fL (ref 78.0–100.0)
Platelets: 148 10*3/uL — ABNORMAL LOW (ref 150–400)
RBC: 3.8 MIL/uL — AB (ref 3.87–5.11)
RDW: 15.9 % — ABNORMAL HIGH (ref 11.5–15.5)
WBC: 11.6 10*3/uL — ABNORMAL HIGH (ref 4.0–10.5)

## 2018-03-11 LAB — BASIC METABOLIC PANEL
Anion gap: 13 (ref 5–15)
BUN: 41 mg/dL — AB (ref 6–20)
CHLORIDE: 113 mmol/L — AB (ref 101–111)
CO2: 22 mmol/L (ref 22–32)
Calcium: 9.7 mg/dL (ref 8.9–10.3)
Creatinine, Ser: 1.47 mg/dL — ABNORMAL HIGH (ref 0.44–1.00)
GFR calc non Af Amer: 31 mL/min — ABNORMAL LOW (ref >60)
GFR, EST AFRICAN AMERICAN: 36 mL/min — AB (ref >60)
Glucose, Bld: 124 mg/dL — ABNORMAL HIGH (ref 65–99)
Potassium: 3.3 mmol/L — ABNORMAL LOW (ref 3.5–5.1)
SODIUM: 148 mmol/L — AB (ref 135–145)

## 2018-03-11 LAB — MAGNESIUM: MAGNESIUM: 2.1 mg/dL (ref 1.7–2.4)

## 2018-03-11 LAB — PHOSPHORUS: Phosphorus: 3.2 mg/dL (ref 2.5–4.6)

## 2018-03-11 MED ORDER — POTASSIUM CHLORIDE CRYS ER 20 MEQ PO TBCR
40.0000 meq | EXTENDED_RELEASE_TABLET | Freq: Once | ORAL | Status: AC
  Administered 2018-03-11: 40 meq via ORAL
  Filled 2018-03-11: qty 2

## 2018-03-11 MED ORDER — METRONIDAZOLE IN NACL 5-0.79 MG/ML-% IV SOLN
500.0000 mg | Freq: Three times a day (TID) | INTRAVENOUS | Status: DC
  Administered 2018-03-11 – 2018-03-12 (×3): 500 mg via INTRAVENOUS
  Filled 2018-03-11 (×3): qty 100

## 2018-03-11 NOTE — Consult Note (Signed)
Consultation Note Date: 03/11/2018   Patient Name: Brooke Conley  DOB: 02-15-1927  MRN: [id number]  Age / Sex: 84 y.o., female  PCP: [name], MD Referring Physician: Mariel Aloe, MD  Reason for Consultation: Establishing goals of care and Psychosocial/spiritual support  HPI/Patient Profile: 84 y.o. female   admitted on 02/04/2018 with PMH  of breast cancer, hypertension, hyperlipidemia  admitted to the  hospital after a fall.  Today she is weak and lethargic, requiring O2 at 9l/Brooke Conley to maintain O2 SATS, she has poor po intake.  Per her son the patient has had significant physical and functional decline over the past two months.  Patient and her family face treatment option decisions, advanced directive decisions, and anticipatory care needs.  Patient has a clear documented AD desiring a natural death, copy in chart.    Clinical Assessment and Goals of Care:  This NP Wadie Lessen reviewed medical records, received report from team, assessed the patient and then meet at the patient's bedside along with her son/ Brooke Conley  to discuss diagnosis, prognosis, GOC, EOL wishes disposition and options.  Concept of Hospice and Palliative Care were discussed  A discussion was had today regarding advanced directives.  Concepts specific to code status, artifical feeding and hydration, continued IV antibiotics and rehospitalization was had.    The difference between a aggressive medical intervention path  and a palliative comfort care path for this patient at this time was had.    Values and goals of care important to patient and family were attempted to be elicited.  Independence and quality are a priority to Brooke Conley.  Patient and her son agree on a care plan to continue medical interventions, treating the treatable over the next 24-48 hours.  They are hopeful for improvement.   Future  decisions dependent on outcomes.   MOST form introduced  Natural trajectory and expectations at EOL were discussed.  Questions and concerns addressed.   Family encouraged to call with questions or concerns.    PMT will continue to support holistically, a f/u meeting scheduled for Tuesday at 4 when patient's daughter arrives from out of town.  If no significant improvement has occurred, residential hospice to be considered    [place]    Code Status/Advance Care Planning:  DNR-documented today   Palliative Prophylaxis:   Aspiration, Delirium Protocol, Frequent Pain Assessment and Oral Care  Additional Recommendations (Limitations, Scope, Preferences):   Continue current medical interventions, treat the treatable   No Artificial Feeding now or in the future  Psycho-social/Spiritual:   Desire for further Chaplaincy support:yes  Additional Recommendations: Education on Hospice  Prognosis:   Will depends on outcomes over the next 24-48 hrs  Discharge Planning: To Be Determined      Primary Diagnoses: Present on Admission: . Pneumothorax . Hypertension . Syncope . Hypothyroid   I have reviewed the medical record, interviewed the patient and family, and examined the patient. The following aspects are pertinent.  Past  Medical History:  Diagnosis Date  . History of breast cancer   . History of uterine cancer 1982  . Hyperlipidemia   . Hypertension   . Osteoporosis   . Prediabetes   . Vitamin D deficiency    Social History   Socioeconomic History  . Marital status: Divorced    Spouse name: Not on file  . Number of children: Not on file  . Years of education: Not on file  . Highest education Conley: Not on file  Occupational History  . Not on file  Social Needs  . Financial resource strain: Not on file  . Food insecurity:    Worry: Not on file    Inability: Not on file  . Transportation needs:     Medical: Not on file    Non-medical: Not on file  Tobacco Use  . Smoking status: Never Smoker  . Smokeless tobacco: Never Used  Substance and Sexual Activity  . Alcohol use: No  . Drug use: No  . Sexual activity: Not on file  Lifestyle  . Physical activity:    Days per week: Not on file    Minutes per session: Not on file  . Stress: Not on file  Relationships  . Social connections:    Talks on phone: Not on file    Gets together: Not on file    Attends religious service: Not on file    Active member of club or organization: Not on file    Attends meetings of clubs or organizations: Not on file    Relationship status: Not on file  Other Topics Concern  . Not on file  Social History Narrative  . Not on file   Family History  Problem Relation Age of Onset  . Cancer Mother        lung  . Hypertension Father    Scheduled Meds: . amiodarone  200 mg Oral BID  . apixaban  2.5 mg Oral BID  . ipratropium-albuterol  3 mL Nebulization TID  . levothyroxine  25 mcg Oral QAC breakfast  . mouth rinse  15 mL Mouth Rinse BID  . metoprolol tartrate  25 mg Oral BID  . sertraline  25 mg Oral Daily  . tamoxifen  20 mg Oral Daily   Continuous Infusions: . ceFEPime (MAXIPIME) IV Stopped (03/10/18 0940)   PRN Meds:.acetaminophen, acetaminophen, albuterol, hydrALAZINE, ibuprofen, methocarbamol, morphine injection, naLOXone (NARCAN)  injection, oxyCODONE Medications Prior to Admission:  Prior to Admission medications   Medication Sig Start Date End Date Taking? Authorizing Provider  acetaminophen (TYLENOL) 500 MG tablet Take 1,000 mg by mouth every 6 (six) hours as needed.   Yes [provider]  amiodarone (PACERONE) 200 MG tablet TAKE 1 TABLET BY MOUTH TWO  TIMES DAILY Patient taking differently: TAKE 200 mg TABLET BY MOUTH TWO  TIMES DAILY 02/24/18  Yes [name], MD  apixaban (ELIQUIS) 2.5 MG TABS tablet Take 1 tablet (2.5 mg total) by mouth 2 (two) times daily. 02/08/18  03/10/18 Yes [name], MD  levothyroxine (SYNTHROID, LEVOTHROID) 50 MCG tablet Take 1 tablet by mouth  daily before breakfast Patient taking differently: 0.25 mcg. Take 1 tablet by mouth  daily before breakfast 08/21/17  Yes [name], MD  metoprolol succinate (TOPROL-XL) 50 MG 24 hr tablet Take 1 tablet (50 mg total) by mouth daily. Take with or immediately following a meal. 02/06/18 03/08/18 Yes [name], MD  tamoxifen (NOLVADEX) 20 MG tablet TAKE 1 TABLET BY MOUTH  EVERY DAY  FOR OSTEOPOROSIS Patient taking differently: TAKE 20 mg TABLET BY MOUTH  EVERY DAY FOR OSTEOPOROSIS 09/17/16  Yes [name], MD  Bilberry 1000 MG CAPS Take 1 capsule by mouth daily.    [provider]  Biotin 10 MG CAPS Take 10 mg by mouth daily.    [provider]  Calcium Citrate-Vitamin D (CALCIUM + D PO) Take 1 tablet by mouth 2 (two) times daily. Takes calcium 600 mg and vitamin D 800 units BID    [provider]  Omega-3 Fatty Acids (FISH OIL) 1000 MG CAPS Take 1 capsule by mouth daily.    [provider]  tamoxifen (NOLVADEX) 20 MG tablet TAKE 1 TABLET BY MOUTH  EVERY DAY FOR OSTEOPOROSIS Patient not taking: Reported on 02/24/2018 10/09/17   [name], MD   Allergies  Allergen Reactions  . Penicillins Rash   Review of Systems  Constitutional: Positive for fatigue.  Neurological: Positive for weakness.    Physical Exam  Constitutional: She appears lethargic. She appears cachectic. She appears ill. Nasal cannula in place.  Cardiovascular: Tachycardia present.  Pulmonary/Chest: She has decreased breath sounds.  Neurological: She appears lethargic.  Skin: Skin is warm and dry.    Vital Signs: BP (!) 173/59 (BP Location: Right Arm)   Pulse 82   Temp 99.1 F (37.3 C) (Oral)   Resp (!) 26   Ht 5\' 3"  (1.6 m)   Wt 44.9 kg (99 lb)   SpO2 96%   BMI 17.54 kg/m  Pain Scale: 0-10   Pain Score: 0-No pain   SpO2: SpO2: 96 % O2 Device:SpO2: 96  % O2 Flow Rate: .O2 Flow Rate (L/min): 10 L/min  IO: Intake/output summary:   Intake/Output Summary (Last 24 hours) at 03/11/2018 0851 Last data filed at 03/10/2018 2345 Gross per 24 hour  Intake 700 ml  Output 675 ml  Net 25 ml    LBM: Last BM Date: 03/01/18 Baseline Weight: Weight: 47.6 kg (105 lb) Most recent weight: Weight: 44.9 kg (99 lb)     Palliative Assessment/Data: 30 % at best   Discussed with Dr Lonny Prude  Time In: 1400 Time Out: 1515 Time Total: 75 minutes Greater than 50%  of this time was spent counseling and coordinating care related to the above assessment and plan.  Signed by: Wadie Lessen, NP   Please contact Palliative Medicine Team phone at [phone] for questions and concerns.  For individual provider: See Shea Evans

## 2018-03-11 NOTE — Progress Notes (Signed)
   03/11/18 1800  Clinical Encounter Type  Visited With Patient  Visit Type Follow-up  Spiritual Encounters  Spiritual Needs Prayer;Emotional   Followed back up and patient was available.  She was alone in the bed.  I introduced myself and let her know I had met her son and wanted to know if I could pray with her.  She welcomed the prayer and extended her hand.  Following the pray I could tell she seemed worried and a little anxious.  She did not want to say much but just looked at me with words she could not find.  Will follow and support as needed. Chaplain Katherene Ponto

## 2018-03-11 NOTE — Progress Notes (Signed)
   03/11/18 1600  Clinical Encounter Type  Visited With Patient not available;Family  Visit Type Initial  Referral From Palliative care team  Consult/Referral To Chaplain  Stress Factors  Family Stress Factors Major life changes   Responded to a [facility] for Palliative.  Patient had healthcare team in the room and nurse indicated the son was down the hall.  Went and visited with the son.  He was a little emotional speaking about his mom and getting to this point that they may need Hospice.  Indicated the patient had let him know what she wanted and did not want at the EOL.  Daughter of the patient will arrive on Tuesday and son seemed pleased he will have some support.  Will follow up with patient later today. Chaplain Katherene Ponto

## 2018-03-11 NOTE — Progress Notes (Signed)
PROGRESS NOTE    Brooke Conley  ZOX:[id number] DOB: 04/04/1927 DOA: 02/06/2018 PCP: [name], MD   Brief Narrative: Brooke Conley is a 84 y.o. female with a history of breast cancer, hypertension, hyperlipidemia. She presented with syncope with subsequent rib/clavicular fractures and pneumothorax.   Assessment & Plan:   Principal Problem:   Adjustment disorder with depressed mood Active Problems:   Hypertension   Hypothyroid   Syncope   Pneumothorax   CKD (chronic kidney disease) stage 3, GFR 30-59 ml/min (HCC)   Aspiration into airway   Protein-calorie malnutrition, severe   Left apical pneumothorax Left distal clavicular fracture Multiple left rib fractures (3-5) Pneumothorax resolved on most recent chest x-ray. -Trauma recommendations: outpatient orthopedic/trauma follow-up in 2-3 weeks -Sling for left arm  Fever Patient started empirically on antibiotics. No infectious source. Blood cultures obtained. Urine culture obtained after antibiotics and does not appear to have been received. Continues to be afebrile -Continue Cefepime -Blood cultures pending (no growth to date)  Acute respiratory distress Acute respiratory failure with hypoxia Last chest x-ray with some atelectasis. Worsening rhonchi on exam today. Increased work of breathing -SLP evaluation: Dysphagia 1 -Wean oxygen as able -Chest CT  Acute metabolic encephalopathy Thought secondary to medication effect vs infection. Patient with elevated TSH and low free T3 with normal T4. Unsure if patient is actually taking Synthroid as prescribed. EEG normal. MRI without acute abnormality. Possibly secondary to Hypothyroidism. Improved.  Essential hypertension Hypertensive -Continue metoprolol -Continue hydralazine prn  Hypothyroidism Unsure if patient is adherent with regimen. TSH elevated. Free T4 normal and free T3 low -Continue Synthroid  Paroxysmal atrial fibrillation Stable -Continue  metoprolol, Eliquis  History of breast cancer Osteoporosis -Continue Tamoxifen  Lung nodules Outpatient follow-up with repeat non-contrast chest CT in 3-6 months  Acute urinary retention ?infection. Patient started on antibiotics on 4/2 so unsure. Afebrile. Resolved.  Malnutrition Dysphagia 1 diet -Continue dysphagia 1 diet -Watch potassium, phosphorus, magnesium   DVT prophylaxis: Eliquis Code Status:   Code Status: Full Code Family Communication: None at bedside Disposition Plan: Discharge likely to SNF when medically stable, off of oxygen, improvement of mental status   Consultants:   Trauma  Orthopedic surgery  Procedures:   None  Antimicrobials:  Vancomycin (4/2)  Cefepime (4/2>>   Subjective: Uncomfortable.  Objective: Vitals:   03/11/18 0022 03/11/18 0500 03/11/18 0821 03/11/18 0848  BP: (!) 167/66  (!) 173/59   Pulse: 72  82   Resp:   (!) 26   Temp: (!) 97.5 F (36.4 C)  99.1 F (37.3 C)   TempSrc: Oral  Oral   SpO2: 96%  98% 96%  Weight:  44.9 kg (99 lb)    Height:        Intake/Output Summary (Last 24 hours) at 03/11/2018 1141 Last data filed at 03/10/2018 2345 Gross per 24 hour  Intake 375 ml  Output 675 ml  Net -300 ml   Filed Weights   03/09/18 0429 03/10/18 0500 03/11/18 0500  Weight: 43.8 kg (96 lb 8 oz) 44.8 kg (98 lb 11.2 oz) 44.9 kg (99 lb)    Examination:  General exam: Appears calm and comfortable Respiratory system: Rhonchi on right side. Increased accessory muscle usage. Cardiovascular system: Regular rate and rhythm. Normal S1 and S2. No heart murmurs present. No extra heart sounds Gastrointestinal system: Soft, non-tender, non-distended, no guarding, no rebound, no masses felt Central nervous system: Alert, oriented to person, place and time. Extremities: No edema. No calf tenderness. Skin:  No cyanosis. No rashes Psychiatry: Flat affect    Data Reviewed: I have personally reviewed following labs and imaging  studies  CBC: Recent Labs  Lab 03/09/18 0752 03/10/18 0243 03/11/18 0710  WBC 7.7 11.3* 11.6*  HGB 11.4* 12.8 11.7*  HCT 36.4 39.2 37.2  MCV 96.3 96.8 97.9  PLT 131* 125* 967*   Basic Metabolic Panel: Recent Labs  Lab 03/06/18 1253 03/07/18 0335 03/10/18 1849 03/11/18 0710  NA 132* 136 143 148*  K 3.9 3.8 3.4* 3.3*  CL 99* 97* 115* 113*  CO2 24 25 21* 22  GLUCOSE 201* 138* 198* 124*  BUN 36* 37* 36* 41*  CREATININE 1.76* 1.58* 1.28* 1.47*  CALCIUM 8.8* 8.9 9.0 9.7  MG  --   --   --  2.1  PHOS  --   --   --  3.2   GFR: Estimated Creatinine Clearance: 18.8 mL/min (A) (by C-G formula based on SCr of 1.47 mg/dL (H)). Liver Function Tests: No results for input(s): AST, ALT, ALKPHOS, BILITOT, PROT, ALBUMIN in the last 168 hours. No results for input(s): LIPASE, AMYLASE in the last 168 hours. Recent Labs  Lab 03/08/18 1409  AMMONIA 24   Coagulation Profile: No results for input(s): INR, PROTIME in the last 168 hours. Cardiac Enzymes: Recent Labs  Lab 03/04/18 1910 03/05/18 0213  TROPONINI 0.03* 0.04*   BNP (last 3 results) No results for input(s): PROBNP in the last 8760 hours. HbA1C: No results for input(s): HGBA1C in the last 72 hours. CBG: Recent Labs  Lab 03/06/18 0645  GLUCAP 132*   Lipid Profile: No results for input(s): CHOL, HDL, LDLCALC, TRIG, CHOLHDL, LDLDIRECT in the last 72 hours. Thyroid Function Tests: No results for input(s): TSH, T4TOTAL, FREET4, T3FREE, THYROIDAB in the last 72 hours. Anemia Panel: No results for input(s): VITAMINB12, FOLATE, FERRITIN, TIBC, IRON, RETICCTPCT in the last 72 hours. Sepsis Labs: Recent Labs  Lab 03/07/18 0920  PROCALCITON 5.47    Recent Results (from the past 240 hour(s))  MRSA PCR Screening     Status: None   Collection Time: 03/04/18  6:59 PM  Result Value Ref Range Status   MRSA by PCR NEGATIVE NEGATIVE Final    Comment:        The GeneXpert MRSA Assay (FDA approved for NASAL specimens only),  is one component of a comprehensive MRSA colonization surveillance program. It is not intended to diagnose MRSA infection nor to guide or monitor treatment for MRSA infections. Performed at [facility], [place] [address], [place], [place] [address]   Culture, blood (Routine X 2) w Reflex to ID Panel     Status: None (Preliminary result)   Collection Time: 03/06/18 10:50 AM  Result Value Ref Range Status   Specimen Description BLOOD RIGHT ANTECUBITAL  Final   Special Requests IN PEDIATRIC BOTTLE Blood Culture adequate volume  Final   Culture   Final    NO GROWTH 4 DAYS Performed at [facility], [place] [address], [place], [place] [address]    Report Status PENDING  Incomplete  Culture, blood (Routine X 2) w Reflex to ID Panel     Status: None (Preliminary result)   Collection Time: 03/06/18 10:55 AM  Result Value Ref Range Status   Specimen Description BLOOD LEFT ANTECUBITAL  Final   Special Requests   Final    BOTTLES DRAWN AEROBIC ONLY Blood Culture adequate volume   Culture   Final    NO GROWTH 4 DAYS  Performed at [facility], [place] [address] [place], [place] [address]    Report Status PENDING  Incomplete         Radiology Studies: Mr Brain Wo Contrast  Result Date: 03/09/2018 CLINICAL DATA:  Syncope. History of breast cancer, hypertension, hyperlipidemia. EXAM: MRI HEAD WITHOUT CONTRAST TECHNIQUE: Multiplanar, multiecho pulse sequences of the brain and surrounding structures were obtained without intravenous contrast. COMPARISON:  CT HEAD March 06, 2018 and MRI of the head September 19, 2017 FINDINGS: INTRACRANIAL CONTENTS: No reduced diffusion to suggest acute ischemia. No susceptibility artifact to suggest hemorrhage. The ventricles and sulci are normal for patient's age. Old small RIGHT cerebellar infarct. Stable patchy supratentorial and pontine white matter FLAIR T2 hyperintensities. Prominent punctate T2 hyperintensities bilateral basal ganglia and to lesser  extent thalami associated with chronic small vessel ischemic disease. No suspicious parenchymal signal, masses, mass effect. No abnormal extra-axial fluid collections. No extra-axial masses. VASCULAR: Normal major intracranial vascular flow voids present at skull base. SKULL AND UPPER CERVICAL SPINE: No abnormal sellar expansion. No suspicious calvarial bone marrow signal. Craniocervical junction maintained. SINUSES/ORBITS: The mastoid air-cells and included paranasal sinuses are well-aerated.The included ocular globes and orbital contents are non-suspicious. Status post bilateral ocular lens implants. OTHER: None. IMPRESSION: 1. No acute intracranial process. 2. Stable examination including mild to moderate chronic small vessel ischemic disease and old small RIGHT cerebellar infarcts. Electronically Signed   By: Elon Alas M.D.   On: 03/09/2018 16:14   Dg Chest Port 1 View  Result Date: 03/10/2018 CLINICAL DATA:  Respiratory distress with rhonchi. EXAM: PORTABLE CHEST 1 VIEW COMPARISON:  03/08/2018 FINDINGS: Stable cardiomegaly with aortic atherosclerosis. No aneurysm. Biapical pleuroparenchymal opacities are noted overlying the apices of both lungs, unchanged in appearance. Findings may represent apical pleural thickening versus fluid or combination thereof. Some interval progression in airspace opacity involving the right upper lung as well as at the lung bases. Findings likely represent stigmata of CHF though superimposed pneumonia would be difficult to entirely exclude. Probable small effusions at the lung bases. Redemonstration of recent appearing left distal clavicular fracture and left upper rib fractures. No pneumothorax. IMPRESSION: Interval increase in bibasilar and right upper lobe airspace opacities. This in conjunction with cardiomegaly and small pleural effusions likely reflect stigmata of CHF. Superimposed pneumonia would be difficult to entirely exclude in the right upper lobe and both  lung bases. Otherwise, no significant change. Electronically Signed   By: Ashley Royalty M.D.   On: 03/10/2018 18:00        Scheduled Meds: . amiodarone  200 mg Oral BID  . apixaban  2.5 mg Oral BID  . ipratropium-albuterol  3 mL Nebulization TID  . levothyroxine  25 mcg Oral QAC breakfast  . mouth rinse  15 mL Mouth Rinse BID  . metoprolol tartrate  25 mg Oral BID  . sertraline  25 mg Oral Daily  . tamoxifen  20 mg Oral Daily   Continuous Infusions: . ceFEPime (MAXIPIME) IV Stopped (03/11/18 1021)     LOS: 8 days     Cordelia Poche, MD [facility] 03/11/2018, 11:41 AM Pager: [phone]  If 7PM-7AM, please contact night-coverage [email or web address] Password [email or web address] 03/11/2018, 11:41 AM

## 2018-03-11 NOTE — Progress Notes (Signed)
  Speech Language Pathology Treatment: Dysphagia  Patient Details Name: Brooke Conley MRN: [id number] DOB: 1926-12-17 Today's Date: 03/11/2018 Time: 1000-1025 SLP Time Calculation (min) (ACUTE ONLY): 25 min  Assessment / Plan / Recommendation Clinical Impression  Pt seen for follow-up for dysphagia to assess tolerance of dys 1, thin liquids. Nursing staff present and assisting pt with breakfast meal, report she has tolerated well. SLP provided skilled observation and assistance with self-feeding of purees. Pt weak and requesting assistance with feeding. With teaspoons of thin liquid, there is still intermittent oral holding, pt with single swallows and no overt signs of aspiration. SLP assisted with self-feeding small cup sips, which did seem to reduce oral holding. Pt requires usual mod A (verbal cues, tactile assist) to limit size and rate of intake. Pt consumed 8oz thin milk via cup sip with no overt signs of aspiration, respiratory distress or increased work of breathing. Recommend she continue dys 1, thin liquids via teaspoon or small cup sips, with full supervision and assistance. Discussed recommendations with CNA and strategies to limit pt's rate of intake. Will follow up.    HPI HPI: Pt is an 84 y.o. female with h/o breast/uterine cancer, hypertension, hyperlipidemia who presents with c/o fall and ? syncope wtih PTX, L distal clavicular fx, and L rib fx (3-5). Hospitalization was complicated by AMS, fever, and suspected aspiration event. CT Head 4/2 was negative but CXR 4/3 showed development of bibasilar airspace disease which may reflect aspiration or PNA.      SLP Plan  Continue with current plan of care       Recommendations  Diet recommendations: Thin liquid;Dysphagia 1 (puree) Liquids provided via: Teaspoon;Cup(single small sips, one at a time) Medication Administration: Crushed with puree Supervision: Full supervision/cueing for compensatory strategies;Trained caregiver to  feed patient Compensations: Slow rate;Small sips/bites;Multiple dry swallows after each bite/sip;Effortful swallow Postural Changes and/or Swallow Maneuvers: Seated upright 90 degrees                Oral Care Recommendations: Oral care before and after PO Follow up Recommendations: Skilled Nursing facility SLP Visit Diagnosis: Dysphagia, unspecified (R13.10) Plan: Continue with current plan of care       [place], [place], [place] Speech-Language Pathologist Brooke Conley 03/11/2018, 11:00 AM

## 2018-03-12 DIAGNOSIS — Z7189 Other specified counseling: Secondary | ICD-10-CM

## 2018-03-12 LAB — BASIC METABOLIC PANEL
Anion gap: 11 (ref 5–15)
BUN: 52 mg/dL — ABNORMAL HIGH (ref 6–20)
CO2: 20 mmol/L — ABNORMAL LOW (ref 22–32)
Calcium: 9.6 mg/dL (ref 8.9–10.3)
Chloride: 117 mmol/L — ABNORMAL HIGH (ref 101–111)
Creatinine, Ser: 1.67 mg/dL — ABNORMAL HIGH (ref 0.44–1.00)
GFR calc Af Amer: 30 mL/min — ABNORMAL LOW (ref >60)
GFR calc non Af Amer: 26 mL/min — ABNORMAL LOW (ref >60)
Glucose, Bld: 139 mg/dL — ABNORMAL HIGH (ref 65–99)
Potassium: 3.9 mmol/L (ref 3.5–5.1)
Sodium: 148 mmol/L — ABNORMAL HIGH (ref 135–145)

## 2018-03-12 LAB — CBC
HEMATOCRIT: 35.8 % — AB (ref 36.0–46.0)
Hemoglobin: 11.1 g/dL — ABNORMAL LOW (ref 12.0–15.0)
MCH: 30.2 pg (ref 26.0–34.0)
MCHC: 31 g/dL (ref 30.0–36.0)
MCV: 97.3 fL (ref 78.0–100.0)
Platelets: 142 10*3/uL — ABNORMAL LOW (ref 150–400)
RBC: 3.68 MIL/uL — ABNORMAL LOW (ref 3.87–5.11)
RDW: 15.8 % — AB (ref 11.5–15.5)
WBC: 11.1 10*3/uL — ABNORMAL HIGH (ref 4.0–10.5)

## 2018-03-12 LAB — PHOSPHORUS: Phosphorus: 2.7 mg/dL (ref 2.5–4.6)

## 2018-03-12 LAB — MAGNESIUM: Magnesium: 2.1 mg/dL (ref 1.7–2.4)

## 2018-03-12 MED ORDER — MORPHINE SULFATE (PF) 2 MG/ML IV SOLN: 2.0000 mg | INTRAVENOUS | Status: DC | PRN

## 2018-03-13 DIAGNOSIS — Z515 Encounter for palliative care: Secondary | ICD-10-CM

## 2018-03-27 ENCOUNTER — Other Ambulatory Visit: Payer: Self-pay | Admitting: Internal Medicine

## 2018-04-04 NOTE — Progress Notes (Addendum)
PROGRESS NOTE    Brooke Conley  WHQ:[id number] DOB: 06/07/1927 DOA: 02/17/2018 PCP: [name], MD   Brief Narrative: Brooke Conley is a 84 y.o. female with a history of breast cancer, hypertension, hyperlipidemia. She presented with syncope with subsequent rib/clavicular fractures and pneumothorax.   Assessment & Plan:   Principal Problem:   Adjustment disorder with depressed mood Active Problems:   Hypertension   Hypothyroid   Syncope   Pneumothorax   CKD (chronic kidney disease) stage 3, GFR 30-59 ml/min (HCC)   Aspiration into airway   Protein-calorie malnutrition, severe   Palliative care by specialist   DNR (do not resuscitate)   Adult failure to thrive   Respiratory distress   Left apical pneumothorax Left distal clavicular fracture Multiple left rib fractures (3-5) Pneumothorax resolved on most recent chest x-ray. -Trauma recommendations: outpatient orthopedic/trauma follow-up in 2-3 weeks -Sling for left arm  Fever Patient started empirically on antibiotics. No infectious source. Blood cultures obtained. Urine culture obtained after antibiotics and does not appear to have been received. Continues to be afebrile -Continue Cefepime -Blood cultures pending (no growth to date)  Acute respiratory distress Acute respiratory failure with hypoxia Last chest x-ray with some atelectasis. CT scan significant for multifocal pneumonia. Work of breathing slightly improved. -SLP evaluation: Dysphagia 1 -Wean oxygen as able  Multifocal pneumonia Patient has a penicillin allergy -Continue cefepime -Added Flagyl to cover for aspiration  Acute metabolic encephalopathy Thought secondary to medication effect vs infection. Patient with elevated TSH and low free T3 with normal T4. Unsure if patient is actually taking Synthroid as prescribed. EEG normal. MRI without acute abnormality. Possibly secondary to Hypothyroidism. Appears resolved.  Essential  hypertension Hypertensive -Continue metoprolol -Continue hydralazine prn  Hypothyroidism Unsure if patient is adherent with regimen. TSH elevated. Free T4 normal and free T3 low -Continue Synthroid  Paroxysmal atrial fibrillation Stable -Continue metoprolol, Eliquis  History of breast cancer Osteoporosis -Continue Tamoxifen  Lung nodules Outpatient follow-up with repeat non-contrast chest CT in 3-6 months  Acute urinary retention ?infection. Patient started on antibiotics on 4/2 so unsure. Afebrile. Resolved.  Malnutrition Dysphagia 1 diet -Continue dysphagia 1 diet -Watch potassium, phosphorus, magnesium  Concern for retained IV catheter X-ray significant for identification of current IV/catheter. Vascular surgery to evaluate.  Goals of care Palliative care medicine on board. Made DNR   DVT prophylaxis: Eliquis Code Status:   Code Status: DNR Family Communication: None at bedside Disposition Plan: Discharge likely to SNF when medically stable, off of oxygen and goals of care   Consultants:   Trauma  Orthopedic surgery  Procedures:   None  Antimicrobials:  Vancomycin (4/2)  Cefepime (4/2>>  Flagyl (4/7>>   Subjective: Patient wants to go home. No dyspnea.  Objective: Vitals:   03/11/18 1919 03/11/18 2300 2018-03-14 0500 2018/03/14 0806  BP:  (!) 163/73  (!) 139/97  Pulse:  85  89  Resp:  20  18  Temp:  98.4 F (36.9 C)  (!) 97.5 F (36.4 C)  TempSrc:  Oral  Oral  SpO2: 98% 98%  97%  Weight:   45.1 kg (99 lb 6.4 oz)   Height:        Intake/Output Summary (Last 24 hours) at Mar 14, 2018 0944 Last data filed at 03/14/18 0300 Gross per 24 hour  Intake 350 ml  Output 300 ml  Net 50 ml   Filed Weights   03/10/18 0500 03/11/18 0500 Mar 14, 2018 0500  Weight: 44.8 kg (98 lb 11.2 oz) 44.9 kg (  99 lb) 45.1 kg (99 lb 6.4 oz)    Examination:  General exam: Appears calm and comfortable Respiratory system: Right sided rhonchi, diminished breath  sounds, accessory muscle usage Cardiovascular system: Regular rate and rhythm. Normal S1 and S2. No heart murmurs present. No extra heart sounds Gastrointestinal system: Soft, non-tender, non-distended, no guarding, no rebound, no masses felt Central nervous system: Alert, oriented to person, place and time. Extremities: No edema. No calf tenderness. Skin: No cyanosis. No rashes Psychiatry: Flat affect    Data Reviewed: I have personally reviewed following labs and imaging studies  CBC: Recent Labs  Lab 03/09/18 0752 03/10/18 0243 03/11/18 0710 2018-04-06 0337  WBC 7.7 11.3* 11.6* 11.1*  HGB 11.4* 12.8 11.7* 11.1*  HCT 36.4 39.2 37.2 35.8*  MCV 96.3 96.8 97.9 97.3  PLT 131* 125* 148* 540*   Basic Metabolic Panel: Recent Labs  Lab 03/06/18 1253 03/07/18 0335 03/10/18 1849 03/11/18 0710 04-06-18 0337  NA 132* 136 143 148* 148*  K 3.9 3.8 3.4* 3.3* 3.9  CL 99* 97* 115* 113* 117*  CO2 24 25 21* 22 20*  GLUCOSE 201* 138* 198* 124* 139*  BUN 36* 37* 36* 41* 52*  CREATININE 1.76* 1.58* 1.28* 1.47* 1.67*  CALCIUM 8.8* 8.9 9.0 9.7 9.6  MG  --   --   --  2.1 2.1  PHOS  --   --   --  3.2 2.7   GFR: Estimated Creatinine Clearance: 16.6 mL/min (A) (by C-G formula based on SCr of 1.67 mg/dL (H)). Liver Function Tests: No results for input(s): AST, ALT, ALKPHOS, BILITOT, PROT, ALBUMIN in the last 168 hours. No results for input(s): LIPASE, AMYLASE in the last 168 hours. Recent Labs  Lab 03/08/18 1409  AMMONIA 24   Coagulation Profile: No results for input(s): INR, PROTIME in the last 168 hours. Cardiac Enzymes: No results for input(s): CKTOTAL, CKMB, CKMBINDEX, TROPONINI in the last 168 hours. BNP (last 3 results) No results for input(s): PROBNP in the last 8760 hours. HbA1C: No results for input(s): HGBA1C in the last 72 hours. CBG: Recent Labs  Lab 03/06/18 0645  GLUCAP 132*   Lipid Profile: No results for input(s): CHOL, HDL, LDLCALC, TRIG, CHOLHDL, LDLDIRECT in  the last 72 hours. Thyroid Function Tests: No results for input(s): TSH, T4TOTAL, FREET4, T3FREE, THYROIDAB in the last 72 hours. Anemia Panel: No results for input(s): VITAMINB12, FOLATE, FERRITIN, TIBC, IRON, RETICCTPCT in the last 72 hours. Sepsis Labs: Recent Labs  Lab 03/07/18 0920  PROCALCITON 5.47    Recent Results (from the past 240 hour(s))  MRSA PCR Screening     Status: None   Collection Time: 03/04/18  6:59 PM  Result Value Ref Range Status   MRSA by PCR NEGATIVE NEGATIVE Final    Comment:        The GeneXpert MRSA Assay (FDA approved for NASAL specimens only), is one component of a comprehensive MRSA colonization surveillance program. It is not intended to diagnose MRSA infection nor to guide or monitor treatment for MRSA infections. Performed at [facility], [place] [address], [place], [place] [address]   Culture, blood (Routine X 2) w Reflex to ID Panel     Status: None   Collection Time: 03/06/18 10:50 AM  Result Value Ref Range Status   Specimen Description BLOOD RIGHT ANTECUBITAL  Final   Special Requests IN PEDIATRIC BOTTLE Blood Culture adequate volume  Final   Culture   Final    NO GROWTH 5 DAYS  Performed at [facility], [place] [address] [place], [place] [address]    Report Status 03/11/2018 FINAL  Final  Culture, blood (Routine X 2) w Reflex to ID Panel     Status: None   Collection Time: 03/06/18 10:55 AM  Result Value Ref Range Status   Specimen Description BLOOD LEFT ANTECUBITAL  Final   Special Requests   Final    BOTTLES DRAWN AEROBIC ONLY Blood Culture adequate volume   Culture   Final    NO GROWTH 5 DAYS Performed at [facility], [place] [address], [place], [place] [address]    Report Status 03/11/2018 FINAL  Final         Radiology Studies: Dg Forearm Right  Result Date: 03-28-18 CLINICAL DATA:  IV infusion  dysfunction. EXAM: RIGHT FOREARM - 2 VIEW COMPARISON:  None. FINDINGS: A right mid forearm catheter is seen  along the volar aspect of the forearm without kinking. No discontinuity is seen. No acute osseous abnormality is noted. IMPRESSION: Left volar mid forearm IV catheter without kinking or discontinuity. No acute osseous abnormality. Electronically Signed   By: Ashley Royalty M.D.   On: 03-28-18 00:09   Ct Chest Wo Contrast  Result Date: 03/11/2018 CLINICAL DATA:  Increased shortness of breath. History of uterine carcinoma and breast carcinoma. EXAM: CT CHEST WITHOUT CONTRAST TECHNIQUE: Multidetector CT imaging of the chest was performed following the standard protocol without IV contrast. COMPARISON:  Chest radiograph, 03/10/2018.  Chest CT, 03/02/2018. FINDINGS: Cardiovascular: Heart is top-normal in size. No pericardial effusion. There are dense three-vessel coronary artery calcifications. Great vessels normal in caliber. There are stable aortic atherosclerotic calcifications. Mediastinum/Nodes: No mediastinal or hilar masses. No discrete enlarged lymph nodes. Trachea is prominent, widely patent. Esophagus is unremarkable. Lungs/Pleura: Small left and minimal right pleural effusions. Patchy bilateral peribronchovascular airspace opacities are noted in the lower lobes and right upper lobe. Hazy ground-glass type opacities noted in the upper lobes. There is peripheral interstitial thickening. Bronchiectasis is evident in the lower lobes, base of the left upper lobe lingula and in the upper lobes, greater on the right. Pleuroparenchymal scarring at the apices is noted, right greater than left. No pneumothorax. Upper Abdomen: No acute abnormality. Musculoskeletal: No fracture or acute finding. No osteoblastic or osteolytic lesions. Bilateral breast prosthesis with dense peripheral calcification. IMPRESSION: 1. Findings are consistent with multifocal pneumonia with patchy areas of peribronchovascular airspace consolidation most evident in the lower lobes. There also ground-glass opacities which may reflect additional  infection or be due to air trapping. Small left and minimal right pleural effusions. There is interstitial thickening increased when compared the prior CT, which suggest a component of pulmonary edema. There also chronic areas of interstitial thickening/scarring as well as bronchiectasis and right greater than left pleuroparenchymal scarring. 2. No other acute abnormalities. 3. Coronary artery calcifications and aortic atherosclerosis. Aortic Atherosclerosis (ICD10-I70.0). Electronically Signed   By: Lajean Manes M.D.   On: 03/11/2018 13:04   Dg Chest Port 1 View  Result Date: 03/10/2018 CLINICAL DATA:  Respiratory distress with rhonchi. EXAM: PORTABLE CHEST 1 VIEW COMPARISON:  03/08/2018 FINDINGS: Stable cardiomegaly with aortic atherosclerosis. No aneurysm. Biapical pleuroparenchymal opacities are noted overlying the apices of both lungs, unchanged in appearance. Findings may represent apical pleural thickening versus fluid or combination thereof. Some interval progression in airspace opacity involving the right upper lung as well as at the lung bases. Findings likely represent stigmata of CHF though superimposed pneumonia would be difficult to entirely  exclude. Probable small effusions at the lung bases. Redemonstration of recent appearing left distal clavicular fracture and left upper rib fractures. No pneumothorax. IMPRESSION: Interval increase in bibasilar and right upper lobe airspace opacities. This in conjunction with cardiomegaly and small pleural effusions likely reflect stigmata of CHF. Superimposed pneumonia would be difficult to entirely exclude in the right upper lobe and both lung bases. Otherwise, no significant change. Electronically Signed   By: Ashley Royalty M.D.   On: 03/10/2018 18:00        Scheduled Meds: . amiodarone  200 mg Oral BID  . apixaban  2.5 mg Oral BID  . ipratropium-albuterol  3 mL Nebulization TID  . levothyroxine  25 mcg Oral QAC breakfast  . mouth rinse  15 mL  Mouth Rinse BID  . metoprolol tartrate  25 mg Oral BID  . sertraline  25 mg Oral Daily  . tamoxifen  20 mg Oral Daily   Continuous Infusions: . ceFEPime (MAXIPIME) IV Stopped (03/11/18 1021)  . metronidazole Stopped (Apr 08, 2018 0857)     LOS: 9 days     Cordelia Poche, MD [facility] 2018/04/08, 9:44 AM Pager: [phone]  If 7PM-7AM, please contact night-coverage [email or web address] Password [facility] 04-08-2018, 9:44 AM

## 2018-04-04 NOTE — Progress Notes (Signed)
Palliative:  I visited with Ms. Pounds who appears fatigued and weakened. Per RN she only ate a few bites. Oxygen is at 10-11L. She does nod her head "no" when asked about pain or difficult breathing. She appears comfortable but miserable. Meeting planned with family tomorrow noon. Emotional support provided.   Late entry: I was called by unit that Ms. Naval has declined. I went to visit and son is en route and Ms. Densmore is agonal breathing with apneic episodes on NRB mask. I called and spoke with son and updated him on severity of situation and we discussed that the best we can do for his mother at this point is to ensure her comfort. I also called daughter who is currently in [place] airport awaiting her flight to come to visit with her mother. I explained that her mother has made a significant decline and that she is not doing well. Daughter is reassured that her brother is on his way.   I explained to son, Albertina Parr, and his girlfriend, Eustaquio Maize, that Ms. Lover is actively dying and time is likely very short. However, she appears to be comfortable and not struggling. We discussed her wishes and that the best thing we can do for her is to keep her comfortable and let her pass away peacefully as she would desire per her Advance Directive. They agree and do not wish for any prolonging measures at this time. Emotional support provided. All questions/concerns addressed.   Updated shortly after I left room that Ms. Stoltzfus has passed away. Chaplain returned call and will come to offer support as desired by family.   62 min  Vinie Sill, NP Palliative Medicine Team Pager # [phone] (M-F 8a-5p) Team Phone # [phone] (Nights/Weekends)

## 2018-04-04 NOTE — Consult Note (Signed)
Referring Physician: [facility]  Patient name: Brooke Conley MRN: [id number] DOB: 06/17/1927 Sex: female  REASON FOR CONSULT: possible retained foreign body  HPI: Brooke Conley is a 84 y.o. female who had IV removed from right forearm yesterday.  1 cm distal tip of catheter appeared to not be intact.  Pt is a very poor historian.  She does not have any complaints in arm and does not know location of IV site.  I discussed with pt nurse who pointed out site of prior IV in right distal forearm.   Pt admitted with multiple medical problems and fall with pneumothorax.  Past Medical History:  Diagnosis Date  . History of breast cancer   . History of uterine cancer 1982  . Hyperlipidemia   . Hypertension   . Osteoporosis   . Prediabetes   . Vitamin D deficiency    Past Surgical History:  Procedure Laterality Date  . BREAST SURGERY Right 2007   lumpectomy  . CATARACT EXTRACTION Right 2010  . COSMETIC SURGERY Bilateral 1979   Breast  . TONSILLECTOMY    . TOTAL ABDOMINAL HYSTERECTOMY W/ BILATERAL SALPINGOOPHORECTOMY      Family History  Problem Relation Age of Onset  . Cancer Mother        lung  . Hypertension Father     SOCIAL HISTORY: Social History   Socioeconomic History  . Marital status: Divorced    Spouse name: Not on file  . Number of children: Not on file  . Years of education: Not on file  . Highest education level: Not on file  Occupational History  . Not on file  Social Needs  . Financial resource strain: Not on file  . Food insecurity:    Worry: Not on file    Inability: Not on file  . Transportation needs:    Medical: Not on file    Non-medical: Not on file  Tobacco Use  . Smoking status: Never Smoker  . Smokeless tobacco: Never Used  Substance and Sexual Activity  . Alcohol use: No  . Drug use: No  . Sexual activity: Not on file  Lifestyle  . Physical activity:    Days per week: Not on file    Minutes per session: Not on file  .  Stress: Not on file  Relationships  . Social connections:    Talks on phone: Not on file    Gets together: Not on file    Attends religious service: Not on file    Active member of club or organization: Not on file    Attends meetings of clubs or organizations: Not on file    Relationship status: Not on file  . Intimate partner violence:    Fear of current or ex partner: Not on file    Emotionally abused: Not on file    Physically abused: Not on file    Forced sexual activity: Not on file  Other Topics Concern  . Not on file  Social History Narrative  . Not on file    Allergies  Allergen Reactions  . Penicillins Rash    Current Facility-Administered Medications  Medication Dose Route Frequency Provider Last Rate Last Dose  . acetaminophen (TYLENOL) suppository 325 mg  325 mg Rectal Q4H PRN Velvet Bathe, MD   325 mg at 03/07/18 1541  . acetaminophen (TYLENOL) tablet 650 mg  650 mg Oral Q6H PRN Velvet Bathe, MD   650 mg at 03/11/18 2328  . albuterol (PROVENTIL) (  2.5 MG/3ML) 0.083% nebulizer solution 2.5 mg  2.5 mg Nebulization Q4H PRN Gardiner Barefoot, NP   2.5 mg at 03/06/18 2350  . amiodarone (PACERONE) tablet 200 mg  200 mg Oral BID Mariel Aloe, MD   200 mg at 03/11/18 2328  . apixaban (ELIQUIS) tablet 2.5 mg  2.5 mg Oral BID Mariel Aloe, MD   2.5 mg at 03/11/18 2328  . ceFEPIme (MAXIPIME) 1 g in sodium chloride 0.9 % 100 mL IVPB  1 g Intravenous Q24H Norva Riffle, RPH   Stopped at 03/11/18 1021  . hydrALAZINE (APRESOLINE) injection 10 mg  10 mg Intravenous Q6H PRN Jani Gravel, MD   10 mg at 03/11/18 2344  . ibuprofen (ADVIL,MOTRIN) tablet 600 mg  600 mg Oral Q6H PRN Judeth Horn, MD   600 mg at 03/10/18 0223  . ipratropium-albuterol (DUONEB) 0.5-2.5 (3) MG/3ML nebulizer solution 3 mL  3 mL Nebulization TID [name], MD   3 mL at 2018/03/15 0736  . levothyroxine (SYNTHROID, LEVOTHROID) tablet 25 mcg  25 mcg Oral QAC breakfast Jani Gravel, MD   25 mcg at  2018-03-15 0757  . MEDLINE mouth rinse  15 mL Mouth Rinse BID Velvet Bathe, MD   15 mL at 03/11/18 2330  . methocarbamol (ROBAXIN) tablet 500 mg  500 mg Oral Q8H PRN Rolm Bookbinder, MD      . metoprolol tartrate (LOPRESSOR) tablet 25 mg  25 mg Oral BID Mariel Aloe, MD   25 mg at 03/11/18 2329  . metroNIDAZOLE (FLAGYL) IVPB 500 mg  500 mg Intravenous Q8H Mariel Aloe, MD 100 mL/hr at 03-15-18 0757 500 mg at 03-15-18 0757  . morphine 2 MG/ML injection 2 mg  2 mg Intravenous Q3H PRN Mariel Aloe, MD      . naloxone [facility]) injection 0.4 mg  0.4 mg Intravenous PRN Gardiner Barefoot, NP   0.4 mg at 03/06/18 2025  . oxyCODONE (Oxy IR/ROXICODONE) immediate release tablet 2.5 mg  2.5 mg Oral Q4H PRN Judeth Horn, MD      . sertraline (ZOLOFT) tablet 25 mg  25 mg Oral Daily Akintayo, Mojeed, MD   25 mg at 03/11/18 0949  . tamoxifen (NOLVADEX) tablet 20 mg  20 mg Oral Daily Jani Gravel, MD   20 mg at 03/11/18 1001    ROS:   Unable to obtain secondary to confusion  Physical Examination  Vitals:   03/11/18 1919 03/11/18 2300 03/15/18 0500 03/15/2018 0806  BP:  (!) 163/73  (!) 139/97  Pulse:  85  89  Resp:  20  18  Temp:  98.4 F (36.9 C)  (!) 97.5 F (36.4 C)  TempSrc:  Oral  Oral  SpO2: 98% 98%  97%  Weight:   99 lb 6.4 oz (45.1 kg)   Height:        Body mass index is 17.61 kg/m.  General:  Alert and oriented to person and place not situation, no acute distress HEENT: Normal Neck: No bruit or JVD Extremity Pulses:  2+ radial, brachial pulses bilaterally, right medial forearm IV.  Prior stick site opposite of this no palpable cord no hematoma no erythema Musculoskeletal: No deformity or edema, legs flexed in fetal position   DATA:  Forearm xray shows existing IV.  No obvious foreign body seen.  ASSESSMENT:  No physical exam or xray evidence of retained IV catheter   PLAN:  No vascular surgical intervention.  Call if questions.   Ruta Hinds, MD  [facility] Office: [phone] Pager: [phone]

## 2018-04-04 NOTE — Progress Notes (Signed)
Visited family of deceased after she had passed.  One family member flying in from out of town and may want to view body if still possible.  Patient had prearranged service and family will go home and look at it and call nurse with the information.  Conard Novak, Chaplain   03/16/18 1600  Clinical Encounter Type  Visited With Patient and family together  Visit Type Death  Referral From Palliative care team  Consult/Referral To Chaplain  Spiritual Encounters  Spiritual Needs Prayer;Emotional;Grief support  Stress Factors  Family Stress Factors Loss

## 2018-04-04 NOTE — Progress Notes (Signed)
Called to pt room by son's significant other at 1518 and pt was noted to be without pulse, respirations, or heart sounds. Pt was a DNR. Called second registered nurse to the bedside to verify absence of vital signs. Son and significant other at bedside. Time of death was 1520. Dr. Lonny Prude notified.

## 2018-04-04 NOTE — Progress Notes (Signed)
  Speech Language Pathology Treatment: Dysphagia  Patient Details Name: Brooke Conley MRN: [id number] DOB: 1927/01/28 Today's Date: 03-18-2018 Time: 4920-1007 SLP Time Calculation (min) (ACUTE ONLY): 14 min  Assessment / Plan / Recommendation Clinical Impression  Pt presents with fatigue, increased WOB upon arrival and throughout session, and minimal oral acceptance of PO. Pt consumed small cup sips without signs and symptoms concerning for aspiration. Puree trials were significant for oral holding, with pt benefiting from Mod tactile cues from empty spoon on her lip to ensure oral manipulation and eventual swallow of bolus. However, nursing reports that pt is doing well with diet during meals without overt signs/symptoms or oral holding. Given that pt's presentation this session is likely related to fatigue, would not pursue MBS on this date. Recommend continuing pt on current Dys 1 and thin liquid diet with aspiration precautions and full supervision to ensure maximal alertness for PO intake. Given changes in respiration, fluctuating arousal, and concerns for PNA, recommend MBS when able to determine pt's current oropharyngeal status pending updates in [place].    HPI HPI: Pt is an 84 y.o. female with h/o breast/uterine cancer, hypertension, hyperlipidemia who presents with c/o fall and ? syncope wtih PTX, L distal clavicular fx, and L rib fx (3-5). Hospitalization was complicated by AMS, fever, and suspected aspiration event. CT Head 4/2 was negative but CXR 4/3 showed development of bibasilar airspace disease which may reflect aspiration or PNA.      SLP Plan  Continue with current plan of care;MBS       Recommendations  Diet recommendations: Dysphagia 1 (puree);Thin liquid Liquids provided via: Teaspoon;Cup Medication Administration: Crushed with puree Supervision: Full supervision/cueing for compensatory strategies;Trained caregiver to feed patient Compensations: Slow rate;Small  sips/bites;Minimize environmental distractions;Effortful swallow Postural Changes and/or Swallow Maneuvers: Seated upright 90 degrees                Oral Care Recommendations: Oral care BID Follow up Recommendations: Skilled Nursing facility SLP Visit Diagnosis: Dysphagia, unspecified (R13.10) Plan: Continue with current plan of care;MBS       GO              Brooke Conley SLP Student Clinician   Brooke Conley 18-Mar-2018, 11:20 AM

## 2018-04-04 NOTE — Death Summary Note (Signed)
DEATH SUMMARY   Patient Details  Name: Brooke Conley MRN: [id number] DOB: [birth year]  Admission/Discharge Information   Admit Date:  2018-03-09  Date of Death: Date of Death: 03/18/18  Time of Death: Time of Death: 03-09-1519  Length of Stay: Feb 17, 2023  Referring Physician: [name], MD   Reason(s) for Hospitalization  Fall  Diagnoses  Preliminary cause of death:  Secondary Diagnoses (including complications and co-morbidities):  Principal Problem:   Adjustment disorder with depressed mood Active Problems:   Hypertension   Hypothyroid   Syncope   Pneumothorax   CKD (chronic kidney disease) stage 3, GFR 30-59 ml/min (HCC)   Aspiration into airway   Protein-calorie malnutrition, severe   Palliative care by specialist   DNR (do not resuscitate)   Adult failure to thrive   Respiratory distress   Brief Hospital Course (including significant findings, care, treatment, and services provided and events leading to death)  Brooke Conley is a 84 y.o. year old female who presented after a syncopal episode causing fall, multiple rib fractures and clavicle fracture. Patient was seen by trauma service with recommendations for sling and outpatient follow-up. Patient had episode of mental status change, possibly secondary to infection as she was found to have multifocal pneumonia. She was treated empirically with antibiotics and attempts to wean oxygen. She was requiring HFNC >10L and non-rebreather intermittently. Respiratory status worsened. Palliative care was on board for goals of care discussions and patient made DNR during hospital stay. Transition to comfort care prior to death.    Pertinent Labs and Studies  Significant Diagnostic Studies Dg Clavicle Left  Addendum Date: 03-09-2018   ADDENDUM REPORT: 2018/03/09 23:15 ADDENDUM: These results were called by telephone at the time of interpretation on 03-09-2018 at 9:48 p.m. to Dr. Elnora Morrison , who verbally acknowledged these results.  Electronically Signed   By: Fidela Salisbury M.D.   On: 2018/03/09 23:15   Result Date: 2018/03/09 CLINICAL DATA:  Status post fall with left shoulder pain. EXAM: LEFT CLAVICLE - 2+ VIEWS COMPARISON:  None. FINDINGS: Comminuted mildly inferiorly angulated fracture of the distal clavicle. Mild widening of the [facility] joint measuring 5 mm. No evidence of humeral or glenoid fracture. The glenohumeral joint is normally located. Displaced left fourth and fifth lateral rib fractures. Soft tissue pleural/subpleural thickening underneath the rib fractures may represent pulmonary contusion. Small pneumothorax cannot be excluded. Soft tissue emphysema along the left lateral chest wall. IMPRESSION: Comminuted mildly angulated distal clavicular fracture with widening of the AC joint. Displaced left fourth and fifth lateral rib fractures, with pleural subpleural contusion. Suspected small left pneumothorax. Dedicated chest radiograph, right-side-down may be considered for confirmation. Soft tissue emphysema along the left lateral chest wall. Electronically Signed: By: Fidela Salisbury M.D. On: March 09, 2018 21:27   Dg Forearm Right  Result Date: 2018-03-18 CLINICAL DATA:  IV infusion  dysfunction. EXAM: RIGHT FOREARM - 2 VIEW COMPARISON:  None. FINDINGS: A right mid forearm catheter is seen along the volar aspect of the forearm without kinking. No discontinuity is seen. No acute osseous abnormality is noted. IMPRESSION: Left volar mid forearm IV catheter without kinking or discontinuity. No acute osseous abnormality. Electronically Signed   By: Ashley Royalty M.D.   On: 03/18/2018 00:09   Ct Head Wo Contrast  Result Date: 03/06/2018 CLINICAL DATA:  Focal neuro deficit, stroke suspected. Altered level of consciousness. EXAM: CT HEAD WITHOUT CONTRAST TECHNIQUE: Contiguous axial images were obtained from the base of the skull through the vertex without intravenous contrast.  COMPARISON:  CT head 02/13/2018 FINDINGS: Brain: Mild  atrophy stable. Negative for hydrocephalus. Chronic microvascular ischemic change in the white matter. Small chronic infarct right cerebellum unchanged. Negative for acute infarct, hemorrhage, mass. Vascular: Negative for hyperdense vessel Skull: Negative Sinuses/Orbits: Negative sinuses.  Bilateral cataract replacement Other: None IMPRESSION: No acute intracranial abnormality. Mild chronic microvascular ischemic changes stable from the prior study. Electronically Signed   By: Franchot Gallo M.D.   On: 03/06/2018 08:13   Ct Head Wo Contrast  Result Date: 02/16/2018 CLINICAL DATA:  Head trauma while on Eliquis. EXAM: CT HEAD WITHOUT CONTRAST TECHNIQUE: Contiguous axial images were obtained from the base of the skull through the vertex without intravenous contrast. COMPARISON:  Brain MRI 09/19/2017 FINDINGS: Brain: No mass lesion, intraparenchymal hemorrhage or extra-axial collection. No evidence of acute cortical infarct. Minimal white matter hypoattenuation. 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 Vascular: No hyperdense vessel or unexpected vascular calcification. Skull: Normal visualized skull base, calvarium and extracranial soft tissues. Sinuses/Orbits: No sinus fluid levels or advanced mucosal thickening. No mastoid effusion. Normal orbits. IMPRESSION: Mild chronic small vessel disease without acute abnormality. Old right cerebellar infarct. Electronically Signed   By: Ulyses Jarred M.D.   On: 02/02/2018 20:33   Ct Chest Wo Contrast  Result Date: 03/11/2018 CLINICAL DATA:  Increased shortness of breath. History of uterine carcinoma and breast carcinoma. EXAM: CT CHEST WITHOUT CONTRAST TECHNIQUE: Multidetector CT imaging of the chest was performed following the standard protocol without IV contrast. COMPARISON:  Chest radiograph, 03/10/2018.  Chest CT, 02/09/2018. FINDINGS: Cardiovascular: Heart is top-normal in size. No pericardial effusion. There are dense three-vessel coronary artery calcifications.  Great vessels normal in caliber. There are stable aortic atherosclerotic calcifications. Mediastinum/Nodes: No mediastinal or hilar masses. No discrete enlarged lymph nodes. Trachea is prominent, widely patent. Esophagus is unremarkable. Lungs/Pleura: Small left and minimal right pleural effusions. Patchy bilateral peribronchovascular airspace opacities are noted in the lower lobes and right upper lobe. Hazy ground-glass type opacities noted in the upper lobes. There is peripheral interstitial thickening. Bronchiectasis is evident in the lower lobes, base of the left upper lobe lingula and in the upper lobes, greater on the right. Pleuroparenchymal scarring at the apices is noted, right greater than left. No pneumothorax. Upper Abdomen: No acute abnormality. Musculoskeletal: No fracture or acute finding. No osteoblastic or osteolytic lesions. Bilateral breast prosthesis with dense peripheral calcification. IMPRESSION: 1. Findings are consistent with multifocal pneumonia with patchy areas of peribronchovascular airspace consolidation most evident in the lower lobes. There also ground-glass opacities which may reflect additional infection or be due to air trapping. Small left and minimal right pleural effusions. There is interstitial thickening increased when compared the prior CT, which suggest a component of pulmonary edema. There also chronic areas of interstitial thickening/scarring as well as bronchiectasis and right greater than left pleuroparenchymal scarring. 2. No other acute abnormalities. 3. Coronary artery calcifications and aortic atherosclerosis. Aortic Atherosclerosis (ICD10-I70.0). Electronically Signed   By: Lajean Manes M.D.   On: 03/11/2018 13:04   Ct Chest Wo Contrast  Result Date: 02/24/2018 CLINICAL DATA:  Status post fall.  Chest pain. EXAM: CT CHEST WITHOUT CONTRAST TECHNIQUE: Multidetector CT imaging of the chest was performed following the standard protocol without IV contrast.  COMPARISON:  Chest radiograph from the same date. FINDINGS: Cardiovascular: The mediastinal structures are displaced to the right. Mildly enlarged heart. Calcific atherosclerotic disease of the coronary arteries and aorta. Mediastinum/Nodes: No mediastinal lymph nodes. Lungs/Pleura: There is a large left pneumothorax, occupying  approximately 40% of the volume of the left hemithorax, with evidence of tension effect. Pulmonary contusion is seen adjacent to the displaced fourth and fifth left lateral rib fractures. There is a soft tissue thickening in the left apex and left lung base, measuring respectively 0.9 and 1.8 cm. More confluent subpleural soft tissue thickening with pleural calcifications is seen in the right lung apex measuring 1.7 x 5.1 cm. Upper Abdomen: No acute abnormality. Musculoskeletal: Displaced impacted left fourth and fifth lateral rib fractures, likely the cause of the left tension pneumothorax. Nondisplaced left third lateral rib fracture. Small soft tissue emphysema along the lateral left chest wall. Bilateral calcified breast implants. Comminuted left distal clavicular fracture with inferior angulation of the distal fracture fragment. IMPRESSION: Large left tension pneumothorax occupying approximately 40% of the volume of the left hemithorax. Pulmonary contusion underneath displaced fourth and fifth left lateral rib fractures. Additional nondisplaced left lateral third rib fracture. Small amount of left lateral chest wall emphysema. Confluent subpleural soft tissue thickening in the right lung apex. Patient has a history of treated right breast cancer, and these findings may represent post radiation changes. Pulmonary malignancy cannot be entirely excluded. Non-contrast chest CT at 3-6 months is recommended. If the nodules are stable at time of repeat CT, then future CT at 18-24 months (from today's scan) is considered optional for low-risk patients, but is recommended for high-risk patients.  This recommendation follows the consensus statement: Guidelines for Management of Incidental Pulmonary Nodules Detected on CT Images: From the Fleischner Society 2017; Radiology 2017; 284:228-243. Two areas of subpleural soft tissue thickening in the left lung apex and left lung base. Attention on future follow-up. Mildly enlarged heart. Calcific atherosclerotic disease of the coronary arteries and aorta. Comminuted slightly angulated distal left clavicular fracture. Critical Value/emergent results were called by telephone at the time of interpretation on 02/26/2018 at 10:50 pm to Dr. Elnora Morrison , who verbally acknowledged these results. Aortic Atherosclerosis (ICD10-I70.0). Electronically Signed   By: Fidela Salisbury M.D.   On: 02/07/2018 23:13   Mr Brain Wo Contrast  Result Date: 03/09/2018 CLINICAL DATA:  Syncope. History of breast cancer, hypertension, hyperlipidemia. EXAM: MRI HEAD WITHOUT CONTRAST TECHNIQUE: Multiplanar, multiecho pulse sequences of the brain and surrounding structures were obtained without intravenous contrast. COMPARISON:  CT HEAD March 06, 2018 and MRI of the head September 19, 2017 FINDINGS: INTRACRANIAL CONTENTS: No reduced diffusion to suggest acute ischemia. No susceptibility artifact to suggest hemorrhage. The ventricles and sulci are normal for patient's age. Old small RIGHT cerebellar infarct. Stable patchy supratentorial and pontine white matter FLAIR T2 hyperintensities. Prominent punctate T2 hyperintensities bilateral basal ganglia and to lesser extent thalami associated with chronic small vessel ischemic disease. No suspicious parenchymal signal, masses, mass effect. No abnormal extra-axial fluid collections. No extra-axial masses. VASCULAR: Normal major intracranial vascular flow voids present at skull base. SKULL AND UPPER CERVICAL SPINE: No abnormal sellar expansion. No suspicious calvarial bone marrow signal. Craniocervical junction maintained. SINUSES/ORBITS: The  mastoid air-cells and included paranasal sinuses are well-aerated.The included ocular globes and orbital contents are non-suspicious. Status post bilateral ocular lens implants. OTHER: None. IMPRESSION: 1. No acute intracranial process. 2. Stable examination including mild to moderate chronic small vessel ischemic disease and old small RIGHT cerebellar infarcts. Electronically Signed   By: Elon Alas M.D.   On: 03/09/2018 16:14   Dg Chest Port 1 View  Result Date: 03/10/2018 CLINICAL DATA:  Respiratory distress with rhonchi. EXAM: PORTABLE CHEST 1 VIEW COMPARISON:  03/08/2018 FINDINGS: Stable  cardiomegaly with aortic atherosclerosis. No aneurysm. Biapical pleuroparenchymal opacities are noted overlying the apices of both lungs, unchanged in appearance. Findings may represent apical pleural thickening versus fluid or combination thereof. Some interval progression in airspace opacity involving the right upper lung as well as at the lung bases. Findings likely represent stigmata of CHF though superimposed pneumonia would be difficult to entirely exclude. Probable small effusions at the lung bases. Redemonstration of recent appearing left distal clavicular fracture and left upper rib fractures. No pneumothorax. IMPRESSION: Interval increase in bibasilar and right upper lobe airspace opacities. This in conjunction with cardiomegaly and small pleural effusions likely reflect stigmata of CHF. Superimposed pneumonia would be difficult to entirely exclude in the right upper lobe and both lung bases. Otherwise, no significant change. Electronically Signed   By: Ashley Royalty M.D.   On: 03/10/2018 18:00   Dg Chest Port 1 View  Result Date: 03/08/2018 CLINICAL DATA:  Rhonchi EXAM: PORTABLE CHEST 1 VIEW COMPARISON:  March 06, 2018 FINDINGS: There is atelectatic change in the lung bases. There is apical pleural thickening bilaterally, stable. There is no edema or consolidation. Heart size and pulmonary vascularity are  normal. No adenopathy. There are calcified breast implants bilaterally. No pneumothorax evident. There is evidence of a fracture of the lateral left clavicle as well as several rib fractures superiorly on the left. IMPRESSION: Currently no pneumothorax. Bibasilar atelectasis. No edema or consolidation. Stable cardiac silhouette. Apical pleural thickening noted bilaterally. Several superior rib fractures on the left as well as a fracture of the lateral left clavicle, mildly displaced. Electronically Signed   By: Lowella Grip III M.D.   On: 03/08/2018 12:38   Dg Chest Port 1 View  Result Date: 03/07/2018 CLINICAL DATA:  Aspiration short of breath EXAM: PORTABLE CHEST 1 VIEW COMPARISON:  03/06/2018, 03/04/2018, 02/24/2018 FINDINGS: Calcified breast implants. Development of bibasilar airspace disease right greater than left. Stable borderline to mild cardiomegaly with aortic atherosclerosis. Biapical pleural and parenchymal thickening. Small residual left apical pneumothorax without significant change. Left upper rib fractures are again demonstrated. IMPRESSION: 1. Interim development of bibasilar airspace disease which may reflect aspiration or pneumonia. 2. Stable tiny left apical pneumothorax. Electronically Signed   By: Donavan Foil M.D.   On: 03/07/2018 00:23   Dg Chest Port 1 View  Result Date: 03/06/2018 CLINICAL DATA:  Follow-up left-sided pneumothorax. Status post left chest tube removal. EXAM: PORTABLE CHEST 1 VIEW COMPARISON:  Portable chest x-ray of March 06, 2018 FINDINGS: There is a persistent small left apical pneumothorax amounting to approximately 5% of the lung volume. On the right no pneumothorax is evident. There is no pleural effusion. There is biapical pleural thickening or scarring. The heart is top-normal in size. The pulmonary vascularity is normal. There is calcification in the wall of the thoracic aorta. There are bilateral breast implants exhibiting capsular calcification. There  are deformities of the lateral aspects of the left fourth and fifth ribs which appears stable. IMPRESSION: There is an approximately 5% left apical pneumothorax which appears smaller than on the previous study. The left chest tube is been removed. There is no significant pleural effusion nor other acute cardiopulmonary abnormality. Underlying COPD. Chronic biapical pleural thickening. Displaced fractures of the left fourth and fifth ribs laterally. Electronically Signed   By: David  Martinique M.D.   On: 03/06/2018 14:00   Dg Chest Port 1 View  Result Date: 03/06/2018 CLINICAL DATA:  Recent pneumothorax EXAM: PORTABLE CHEST 1 VIEW COMPARISON:  March 04, 2018 FINDINGS:  Chest tube remains on the left, positioned laterally with several side holes outside of the left pleural space. There is a persistent small left apical pneumothorax without tension component. There is apical pleural thickening bilaterally, stable. There is no edema or consolidation. Heart is upper normal in size with pulmonary vascularity within normal limits. There is aortic atherosclerosis. There are calcified breast implants bilaterally. IMPRESSION: Left chest tube is laterally position with several sideholes lateral to the left pleural space. Small left apical pneumothorax persists without tension component. No edema or consolidation. Bilateral apical pleural thickening is stable. Stable cardiac silhouette.  There is aortic atherosclerosis. Aortic Atherosclerosis (ICD10-I70.0). Electronically Signed   By: Lowella Grip III M.D.   On: 03/06/2018 07:13   Dg Chest Port 1 View  Result Date: 03/05/2018 CLINICAL DATA:  Left-sided pneumothorax.  Left chest tube in place. EXAM: PORTABLE CHEST 1 VIEW COMPARISON:  02/13/2018 FINDINGS: Unchanged position of left-sided chest tube projecting over the posterior left seventh rib. The previously noted tiny left apical pneumothorax is not apparent on current exam. Biapical crescentic opacities are stable  possibly representing fluid at the apice versus pleuroparenchymal thickening. Stable cardiomegaly with aortic atherosclerosis. Displaced left-sided rib fractures involving the posterior fourth and fifth ribs are redemonstrated. The left lateral third rib fracture seen on CT is obscured by rib overlap. Small amount of subcutaneous emphysema from chest tube insertion. Calcified bilateral breast implants. IMPRESSION: 1. Left-sided chest tube remains in place. No progression of left-sided pneumothorax. The previous tiny left apical pneumothorax is not radiographically apparent. 2. Redemonstration acute displaced left fourth and fifth posterior rib fractures. Electronically Signed   By: Ashley Royalty M.D.   On: 03/05/2018 03:37   Dg Chest Portable 1 View  Result Date: 03/04/2018 CLINICAL DATA:  Status post chest tube insertion. EXAM: PORTABLE CHEST 1 VIEW COMPARISON:  CT chest March 30th 2019 FINDINGS: Interval placement of LEFT pigtailed chest tube. Partially re-expanded LEFT lung with small residual LEFT apical pneumothorax. Biapical pleural thickening. No mediastinal shift. Cardiac silhouette is mildly enlarged. Calcified aortic arch. Calcified breast implants. Acute LEFT fourth rib fracture, additional acute rib fractures better seen on prior radiograph. Acute comminuted distal LEFT clavicle fracture. Old LEFT rib fractures. Small volume LEFT chest wall subcutaneous emphysema. IMPRESSION: Interval placement LEFT chest tube with small residual LEFT apical pneumothorax. Aortic Atherosclerosis (ICD10-I70.0). Electronically Signed   By: Elon Alas M.D.   On: 03/04/2018 00:45   Dg Chest Portable 1 View  Result Date: 03/02/2018 CLINICAL DATA:  84 year old female with fall and left rib pain. EXAM: PORTABLE CHEST 1 VIEW COMPARISON:  Chest radiograph dated 09/18/2017 FINDINGS: The lungs are clear. There is no pleural effusion or pneumothorax. Biapical pleural thickening/scarring. The cardiac silhouette is within  normal limits. Bilateral breast implants. There is osteopenia. There is apparent discontinuity of the left third and fourth ribs. A cardiac monitor lead overlies this area. Dedicated left rib series may provide better evaluation. There is a small subcutaneous air in the left chest wall. IMPRESSION: Fractures of the left third and fourth ribs. Electronically Signed   By: Anner Crete M.D.   On: 02/04/2018 21:24   Dg Shoulder Left Portable  Addendum Date: 03/02/2018   ADDENDUM REPORT: 03/02/2018 21:28 ADDENDUM: Displaced left fourth and fifth lateral rib fractures, with pleural subpleural contusion. Suspected small left pneumothorax. Dedicated chest radiograph, right-side-down may be considered for confirmation. Soft tissue emphysema along the left lateral chest wall. Electronically Signed   By: Fidela Salisbury M.D.   On:  02/05/2018 21:28   Result Date: 02/14/2018 CLINICAL DATA:  Fall with left rib and shoulder pain. EXAM: LEFT SHOULDER - 1 VIEW COMPARISON:  None. FINDINGS: Comminuted mildly inferiorly angulated fracture of the distal clavicle. Mild widening of the [facility] joint measuring 5 mm. No evidence of humeral or glenoid fracture. The glenohumeral joint is normally located. IMPRESSION: Comminuted mildly angulated distal clavicular fracture with widening of the AC joint. Electronically Signed: By: Fidela Salisbury M.D. On: 02/18/2018 21:22    Microbiology Recent Results (from the past 240 hour(s))  MRSA PCR Screening     Status: None   Collection Time: 03/04/18  6:59 PM  Result Value Ref Range Status   MRSA by PCR NEGATIVE NEGATIVE Final    Comment:        The GeneXpert MRSA Assay (FDA approved for NASAL specimens only), is one component of a comprehensive MRSA colonization surveillance program. It is not intended to diagnose MRSA infection nor to guide or monitor treatment for MRSA infections. Performed at [facility], [place] [address] [place], [place] [address]   Culture,  blood (Routine X 2) w Reflex to ID Panel     Status: None   Collection Time: 03/06/18 10:50 AM  Result Value Ref Range Status   Specimen Description BLOOD RIGHT ANTECUBITAL  Final   Special Requests IN PEDIATRIC BOTTLE Blood Culture adequate volume  Final   Culture   Final    NO GROWTH 5 DAYS Performed at [facility], [place] [address], [place], [place] [address]    Report Status 03/11/2018 FINAL  Final  Culture, blood (Routine X 2) w Reflex to ID Panel     Status: None   Collection Time: 03/06/18 10:55 AM  Result Value Ref Range Status   Specimen Description BLOOD LEFT ANTECUBITAL  Final   Special Requests   Final    BOTTLES DRAWN AEROBIC ONLY Blood Culture adequate volume   Culture   Final    NO GROWTH 5 DAYS Performed at [facility], [place] [address] [place], [place] [address]    Report Status 03/11/2018 FINAL  Final    Lab Basic Metabolic Panel: Recent Labs  Lab 03/06/18 1253 03/07/18 0335 03/10/18 1849 03/11/18 0710 2018/03/20 0337  NA 132* 136 143 148* 148*  K 3.9 3.8 3.4* 3.3* 3.9  CL 99* 97* 115* 113* 117*  CO2 24 25 21* 22 20*  GLUCOSE 201* 138* 198* 124* 139*  BUN 36* 37* 36* 41* 52*  CREATININE 1.76* 1.58* 1.28* 1.47* 1.67*  CALCIUM 8.8* 8.9 9.0 9.7 9.6  MG  --   --   --  2.1 2.1  PHOS  --   --   --  3.2 2.7   Liver Function Tests: No results for input(s): AST, ALT, ALKPHOS, BILITOT, PROT, ALBUMIN in the last 168 hours. No results for input(s): LIPASE, AMYLASE in the last 168 hours. Recent Labs  Lab 03/08/18 1409  AMMONIA 24   CBC: Recent Labs  Lab 03/09/18 0752 03/10/18 0243 03/11/18 0710 2018-03-20 0337  WBC 7.7 11.3* 11.6* 11.1*  HGB 11.4* 12.8 11.7* 11.1*  HCT 36.4 39.2 37.2 35.8*  MCV 96.3 96.8 97.9 97.3  PLT 131* 125* 148* 142*   Cardiac Enzymes: No results for input(s): CKTOTAL, CKMB, CKMBINDEX, TROPONINI in the last 168 hours. Sepsis Labs: Recent Labs  Lab 03/07/18 0920 03/09/18 9323 03/10/18 0243 03/11/18 0710  03-20-2018 0337  PROCALCITON 5.47  --   --   --   --  WBC  --  7.7 11.3* 11.6* 11.1*    Procedures/Operations  None   Cordelia Poche, MD 04-03-18, 7:09 PM

## 2018-04-04 NOTE — Progress Notes (Signed)
Upon removal of PIV in right forearm, catheter was not intact. Tourniquet was applied to arm above site.   Bodenheimer,NP was notified Patient was taken for xray of right arm.  No new orders given at this time Will continue to monitor

## 2018-04-04 NOTE — Progress Notes (Signed)
Pharmacy Antibiotic Note  Brooke Conley is a 84 y.o. female admitted on 02/10/2018 with a fall.  Pharmacy has been consulted for Cefepime dosing, initially started for fever of unknown origin, now continuing for multifocal pneumonia.  Note Flagyl was added 4/7.  Plan: Continue cefepime 1g IV q24h Monitor clinical picture, renal function F/U C&S, abx deescalation / LOT  Addendum 04/09/18, 10:34 AM Lab Results  Component Value Date   CREATININE 1.67 (H) 09-Apr-2018   CREATININE 1.47 (H) 03/11/2018   CREATININE 1.28 (H) 03/10/2018    Today's SCr is stable from her prior values. No adjustment needed to the antibiotic regimen ordered.  Height: 5\' 3"  (160 cm) Weight: 99 lb 6.4 oz (45.1 kg) IBW/kg (Calculated) : 52.4  Temp (24hrs), Avg:98.1 F (36.7 C), Min:97.5 F (36.4 C), Max:98.4 F (36.9 C)  Recent Labs  Lab 03/06/18 1253 03/07/18 0335 03/09/18 0752 03/10/18 0243 03/10/18 1849 03/11/18 0710 2018/04/09 0337  WBC  --   --  7.7 11.3*  --  11.6* 11.1*  CREATININE 1.76* 1.58*  --   --  1.28* 1.47* 1.67*    Estimated Creatinine Clearance: 16.6 mL/min (A) (by C-G formula based on SCr of 1.67 mg/dL (H)).    Allergies  Allergen Reactions  . Penicillins Rash    Antimicrobials this admission: Vanc 4/2>> 4/3 Cefepime 4/2>> Flagyl 4/7>>  Microbiology results: 4/2 BCx: neg 3/31 MRSA PCR: neg  Thank you for allowing pharmacy to be a part of this patient's care.  Norva Riffle 2018-04-09 10:34 AM

## 2018-04-04 DEATH — deceased

## 2018-10-01 ENCOUNTER — Encounter: Payer: Self-pay | Admitting: Internal Medicine
